# Patient Record
Sex: Female | Born: 1960 | Hispanic: Refuse to answer | Marital: Married | Smoking: Former smoker
Health system: Southern US, Community
[De-identification: ages and names within clinical notes are randomized; demographics above are authoritative.]

## PROBLEM LIST (undated history)

## (undated) DIAGNOSIS — M1990 Unspecified osteoarthritis, unspecified site: Secondary | ICD-10-CM

## (undated) DIAGNOSIS — R5383 Other fatigue: Secondary | ICD-10-CM

## (undated) DIAGNOSIS — T7840XA Allergy, unspecified, initial encounter: Secondary | ICD-10-CM

## (undated) DIAGNOSIS — Z13220 Encounter for screening for lipoid disorders: Secondary | ICD-10-CM

## (undated) DIAGNOSIS — E785 Hyperlipidemia, unspecified: Secondary | ICD-10-CM

## (undated) DIAGNOSIS — N3010 Interstitial cystitis (chronic) without hematuria: Secondary | ICD-10-CM

## (undated) DIAGNOSIS — K5792 Diverticulitis of intestine, part unspecified, without perforation or abscess without bleeding: Secondary | ICD-10-CM

## (undated) DIAGNOSIS — K219 Gastro-esophageal reflux disease without esophagitis: Secondary | ICD-10-CM

## (undated) DIAGNOSIS — M810 Age-related osteoporosis without current pathological fracture: Secondary | ICD-10-CM

## (undated) DIAGNOSIS — R5381 Other malaise: Secondary | ICD-10-CM

## (undated) DIAGNOSIS — M8580 Other specified disorders of bone density and structure, unspecified site: Secondary | ICD-10-CM

## (undated) HISTORY — DX: Age-related osteoporosis without current pathological fracture: M81.0

## (undated) HISTORY — DX: Encounter for screening for lipoid disorders: Z13.220

## (undated) HISTORY — PX: TONSILLECTOMY: SUR1361

## (undated) HISTORY — DX: Hyperlipidemia, unspecified: E78.5

## (undated) HISTORY — DX: Other malaise: R53.81

## (undated) HISTORY — DX: Other malaise: R53.83

## (undated) HISTORY — PX: INTERSTIM IMPLANT PLACEMENT: SHX5130

## (undated) HISTORY — DX: Gastro-esophageal reflux disease without esophagitis: K21.9

## (undated) HISTORY — DX: Interstitial cystitis (chronic) without hematuria: N30.10

## (undated) HISTORY — DX: Other specified disorders of bone density and structure, unspecified site: M85.80

## (undated) HISTORY — PX: VAGINAL HYSTERECTOMY: SUR661

## (undated) HISTORY — PX: WRIST FRACTURE SURGERY: SHX121

## (undated) HISTORY — DX: Unspecified osteoarthritis, unspecified site: M19.90

## (undated) HISTORY — PX: TUBAL LIGATION: SHX77

## (undated) HISTORY — DX: Diverticulitis of intestine, part unspecified, without perforation or abscess without bleeding: K57.92

## (undated) HISTORY — PX: OTHER SURGICAL HISTORY: SHX169

## (undated) HISTORY — PX: WISDOM TOOTH EXTRACTION: SHX21

## (undated) HISTORY — DX: Allergy, unspecified, initial encounter: T78.40XA

## (undated) HISTORY — PX: COLONOSCOPY: SHX174

## (undated) NOTE — *Deleted (*Deleted)
Have a Happy Birthday!  You need an diabetes eye exam every year.  Please see your eye doctor.  Ask them to fax [place] a report of your exam

---

## 1997-11-12 ENCOUNTER — Encounter: Admission: RE | Admit: 1997-11-12 | Discharge: 1997-11-12 | Payer: Self-pay | Admitting: Family Medicine

## 1997-11-19 ENCOUNTER — Encounter: Admission: RE | Admit: 1997-11-19 | Discharge: 1997-11-19 | Payer: Self-pay | Admitting: Family Medicine

## 1997-11-22 ENCOUNTER — Ambulatory Visit (HOSPITAL_COMMUNITY): Admission: RE | Admit: 1997-11-22 | Discharge: 1997-11-22 | Payer: Self-pay | Admitting: Family Medicine

## 1997-12-05 ENCOUNTER — Encounter: Admission: RE | Admit: 1997-12-05 | Discharge: 1997-12-05 | Payer: Self-pay | Admitting: Family Medicine

## 1998-01-16 ENCOUNTER — Encounter: Admission: RE | Admit: 1998-01-16 | Discharge: 1998-01-16 | Payer: Self-pay | Admitting: Family Medicine

## 1998-02-04 ENCOUNTER — Encounter: Admission: RE | Admit: 1998-02-04 | Discharge: 1998-02-04 | Payer: Self-pay | Admitting: Sports Medicine

## 1998-02-13 ENCOUNTER — Encounter: Admission: RE | Admit: 1998-02-13 | Discharge: 1998-02-13 | Payer: Self-pay | Admitting: Family Medicine

## 1998-02-25 ENCOUNTER — Encounter: Admission: RE | Admit: 1998-02-25 | Discharge: 1998-02-25 | Payer: Self-pay | Admitting: Family Medicine

## 1998-04-07 ENCOUNTER — Encounter: Admission: RE | Admit: 1998-04-07 | Discharge: 1998-04-07 | Payer: Self-pay | Admitting: Family Medicine

## 1998-04-28 ENCOUNTER — Encounter: Admission: RE | Admit: 1998-04-28 | Discharge: 1998-04-28 | Payer: Self-pay | Admitting: Sports Medicine

## 1998-06-03 ENCOUNTER — Emergency Department (HOSPITAL_COMMUNITY): Admission: EM | Admit: 1998-06-03 | Discharge: 1998-06-04 | Payer: Self-pay | Admitting: Emergency Medicine

## 1998-09-19 ENCOUNTER — Encounter: Admission: RE | Admit: 1998-09-19 | Discharge: 1998-09-19 | Payer: Self-pay | Admitting: Family Medicine

## 1998-12-09 ENCOUNTER — Other Ambulatory Visit: Admission: RE | Admit: 1998-12-09 | Discharge: 1998-12-09 | Payer: Self-pay | Admitting: Family Medicine

## 1998-12-09 ENCOUNTER — Encounter: Admission: RE | Admit: 1998-12-09 | Discharge: 1998-12-09 | Payer: Self-pay | Admitting: Family Medicine

## 1999-04-13 ENCOUNTER — Ambulatory Visit (HOSPITAL_COMMUNITY): Admission: RE | Admit: 1999-04-13 | Discharge: 1999-04-13 | Payer: Self-pay | Admitting: Urology

## 1999-04-13 ENCOUNTER — Encounter: Payer: Self-pay | Admitting: Urology

## 1999-04-14 ENCOUNTER — Encounter: Admission: RE | Admit: 1999-04-14 | Discharge: 1999-04-14 | Payer: Self-pay | Admitting: Family Medicine

## 1999-05-06 ENCOUNTER — Encounter: Admission: RE | Admit: 1999-05-06 | Discharge: 1999-05-06 | Payer: Self-pay | Admitting: Family Medicine

## 1999-05-08 ENCOUNTER — Ambulatory Visit (HOSPITAL_BASED_OUTPATIENT_CLINIC_OR_DEPARTMENT_OTHER): Admission: RE | Admit: 1999-05-08 | Discharge: 1999-05-08 | Payer: Self-pay | Admitting: Urology

## 1999-05-08 ENCOUNTER — Encounter (INDEPENDENT_AMBULATORY_CARE_PROVIDER_SITE_OTHER): Payer: Self-pay | Admitting: Specialist

## 1999-05-12 ENCOUNTER — Ambulatory Visit (HOSPITAL_COMMUNITY): Admission: RE | Admit: 1999-05-12 | Discharge: 1999-05-12 | Payer: Self-pay | Admitting: Obstetrics

## 1999-05-12 ENCOUNTER — Encounter: Payer: Self-pay | Admitting: Obstetrics

## 1999-05-18 ENCOUNTER — Encounter: Payer: Self-pay | Admitting: Obstetrics

## 1999-05-18 ENCOUNTER — Ambulatory Visit (HOSPITAL_COMMUNITY): Admission: RE | Admit: 1999-05-18 | Discharge: 1999-05-18 | Payer: Self-pay | Admitting: Obstetrics

## 1999-06-12 ENCOUNTER — Encounter: Admission: RE | Admit: 1999-06-12 | Discharge: 1999-06-12 | Payer: Self-pay | Admitting: Family Medicine

## 1999-07-02 ENCOUNTER — Encounter: Admission: RE | Admit: 1999-07-02 | Discharge: 1999-07-02 | Payer: Self-pay | Admitting: Family Medicine

## 1999-11-07 ENCOUNTER — Emergency Department (HOSPITAL_COMMUNITY): Admission: EM | Admit: 1999-11-07 | Discharge: 1999-11-08 | Payer: Self-pay | Admitting: Emergency Medicine

## 1999-11-08 ENCOUNTER — Encounter: Payer: Self-pay | Admitting: Emergency Medicine

## 1999-11-09 ENCOUNTER — Encounter: Admission: RE | Admit: 1999-11-09 | Discharge: 1999-11-09 | Payer: Self-pay | Admitting: Family Medicine

## 1999-11-13 ENCOUNTER — Encounter: Admission: RE | Admit: 1999-11-13 | Discharge: 1999-11-13 | Payer: Self-pay | Admitting: Family Medicine

## 1999-11-16 ENCOUNTER — Encounter: Payer: Self-pay | Admitting: *Deleted

## 1999-11-16 ENCOUNTER — Ambulatory Visit (HOSPITAL_COMMUNITY): Admission: RE | Admit: 1999-11-16 | Discharge: 1999-11-16 | Payer: Self-pay | Admitting: *Deleted

## 1999-11-17 ENCOUNTER — Encounter: Admission: RE | Admit: 1999-11-17 | Discharge: 1999-11-17 | Payer: Self-pay | Admitting: Family Medicine

## 2000-01-22 ENCOUNTER — Encounter: Payer: Self-pay | Admitting: Internal Medicine

## 2000-01-22 ENCOUNTER — Emergency Department (HOSPITAL_COMMUNITY): Admission: EM | Admit: 2000-01-22 | Discharge: 2000-01-22 | Payer: Self-pay | Admitting: Internal Medicine

## 2000-02-02 ENCOUNTER — Emergency Department (HOSPITAL_COMMUNITY): Admission: EM | Admit: 2000-02-02 | Discharge: 2000-02-02 | Payer: Self-pay

## 2000-05-03 ENCOUNTER — Other Ambulatory Visit: Admission: RE | Admit: 2000-05-03 | Discharge: 2000-05-03 | Payer: Self-pay | Admitting: *Deleted

## 2000-05-25 ENCOUNTER — Encounter: Payer: Self-pay | Admitting: *Deleted

## 2000-05-25 ENCOUNTER — Ambulatory Visit (HOSPITAL_COMMUNITY): Admission: RE | Admit: 2000-05-25 | Discharge: 2000-05-25 | Payer: Self-pay | Admitting: *Deleted

## 2000-08-10 ENCOUNTER — Ambulatory Visit (HOSPITAL_COMMUNITY): Admission: RE | Admit: 2000-08-10 | Discharge: 2000-08-10 | Payer: Self-pay | Admitting: Urology

## 2000-09-06 ENCOUNTER — Other Ambulatory Visit: Admission: RE | Admit: 2000-09-06 | Discharge: 2000-09-06 | Payer: Self-pay | Admitting: *Deleted

## 2000-09-06 ENCOUNTER — Encounter (INDEPENDENT_AMBULATORY_CARE_PROVIDER_SITE_OTHER): Payer: Self-pay

## 2001-01-05 ENCOUNTER — Encounter: Admission: RE | Admit: 2001-01-05 | Discharge: 2001-01-05 | Payer: Self-pay | Admitting: Family Medicine

## 2001-01-31 ENCOUNTER — Encounter: Admission: RE | Admit: 2001-01-31 | Discharge: 2001-01-31 | Payer: Self-pay | Admitting: Family Medicine

## 2001-02-01 ENCOUNTER — Encounter: Admission: RE | Admit: 2001-02-01 | Discharge: 2001-05-02 | Payer: Self-pay | Admitting: Urology

## 2001-02-16 ENCOUNTER — Encounter: Admission: RE | Admit: 2001-02-16 | Discharge: 2001-02-16 | Payer: Self-pay | Admitting: Family Medicine

## 2001-02-24 ENCOUNTER — Encounter: Admission: RE | Admit: 2001-02-24 | Discharge: 2001-02-24 | Payer: Self-pay | Admitting: Family Medicine

## 2001-04-25 ENCOUNTER — Encounter: Admission: RE | Admit: 2001-04-25 | Discharge: 2001-04-25 | Payer: Self-pay | Admitting: Family Medicine

## 2001-06-09 ENCOUNTER — Encounter: Admission: RE | Admit: 2001-06-09 | Discharge: 2001-06-09 | Payer: Self-pay | Admitting: Family Medicine

## 2001-06-12 ENCOUNTER — Encounter: Admission: RE | Admit: 2001-06-12 | Discharge: 2001-06-12 | Payer: Self-pay | Admitting: Sports Medicine

## 2001-06-15 ENCOUNTER — Ambulatory Visit (HOSPITAL_COMMUNITY): Admission: RE | Admit: 2001-06-15 | Discharge: 2001-06-15 | Payer: Self-pay | Admitting: Family Medicine

## 2001-06-15 ENCOUNTER — Encounter: Payer: Self-pay | Admitting: Family Medicine

## 2001-07-06 ENCOUNTER — Encounter: Admission: RE | Admit: 2001-07-06 | Discharge: 2001-07-06 | Payer: Self-pay | Admitting: Family Medicine

## 2001-07-13 ENCOUNTER — Other Ambulatory Visit: Admission: RE | Admit: 2001-07-13 | Discharge: 2001-07-13 | Payer: Self-pay | Admitting: Family Medicine

## 2001-07-13 ENCOUNTER — Encounter (INDEPENDENT_AMBULATORY_CARE_PROVIDER_SITE_OTHER): Payer: Self-pay | Admitting: Specialist

## 2001-07-13 ENCOUNTER — Encounter: Admission: RE | Admit: 2001-07-13 | Discharge: 2001-07-13 | Payer: Self-pay | Admitting: Family Medicine

## 2001-11-02 ENCOUNTER — Encounter: Admission: RE | Admit: 2001-11-02 | Discharge: 2001-11-02 | Payer: Self-pay | Admitting: Family Medicine

## 2001-11-02 ENCOUNTER — Other Ambulatory Visit: Admission: RE | Admit: 2001-11-02 | Discharge: 2001-11-02 | Payer: Self-pay | Admitting: Family Medicine

## 2001-11-02 ENCOUNTER — Encounter (INDEPENDENT_AMBULATORY_CARE_PROVIDER_SITE_OTHER): Payer: Self-pay | Admitting: *Deleted

## 2002-06-06 ENCOUNTER — Other Ambulatory Visit: Admission: RE | Admit: 2002-06-06 | Discharge: 2002-06-06 | Payer: Self-pay | Admitting: *Deleted

## 2002-06-12 ENCOUNTER — Other Ambulatory Visit: Admission: RE | Admit: 2002-06-12 | Discharge: 2002-06-12 | Payer: Self-pay | Admitting: *Deleted

## 2002-06-26 ENCOUNTER — Encounter: Payer: Self-pay | Admitting: Family Medicine

## 2002-06-26 ENCOUNTER — Ambulatory Visit (HOSPITAL_COMMUNITY): Admission: RE | Admit: 2002-06-26 | Discharge: 2002-06-26 | Payer: Self-pay | Admitting: Family Medicine

## 2002-07-18 ENCOUNTER — Ambulatory Visit (HOSPITAL_BASED_OUTPATIENT_CLINIC_OR_DEPARTMENT_OTHER): Admission: RE | Admit: 2002-07-18 | Discharge: 2002-07-18 | Payer: Self-pay | Admitting: Orthopedic Surgery

## 2003-01-31 ENCOUNTER — Encounter: Admission: RE | Admit: 2003-01-31 | Discharge: 2003-01-31 | Payer: Self-pay | Admitting: Family Medicine

## 2003-03-15 ENCOUNTER — Ambulatory Visit (HOSPITAL_COMMUNITY): Admission: RE | Admit: 2003-03-15 | Discharge: 2003-03-15 | Payer: Self-pay | Admitting: Urology

## 2003-03-15 ENCOUNTER — Ambulatory Visit (HOSPITAL_BASED_OUTPATIENT_CLINIC_OR_DEPARTMENT_OTHER): Admission: RE | Admit: 2003-03-15 | Discharge: 2003-03-15 | Payer: Self-pay | Admitting: Urology

## 2003-03-29 ENCOUNTER — Ambulatory Visit (HOSPITAL_BASED_OUTPATIENT_CLINIC_OR_DEPARTMENT_OTHER): Admission: RE | Admit: 2003-03-29 | Discharge: 2003-03-29 | Payer: Self-pay | Admitting: Urology

## 2003-05-09 ENCOUNTER — Encounter: Admission: RE | Admit: 2003-05-09 | Discharge: 2003-05-09 | Payer: Self-pay | Admitting: Family Medicine

## 2003-05-21 ENCOUNTER — Ambulatory Visit (HOSPITAL_COMMUNITY): Admission: RE | Admit: 2003-05-21 | Discharge: 2003-05-21 | Payer: Self-pay | Admitting: Urology

## 2003-06-27 ENCOUNTER — Ambulatory Visit (HOSPITAL_COMMUNITY): Admission: RE | Admit: 2003-06-27 | Discharge: 2003-06-27 | Payer: Self-pay | Admitting: *Deleted

## 2003-08-29 ENCOUNTER — Ambulatory Visit (HOSPITAL_COMMUNITY): Admission: RE | Admit: 2003-08-29 | Discharge: 2003-08-29 | Payer: Self-pay | Admitting: Neurology

## 2003-09-05 ENCOUNTER — Encounter: Admission: RE | Admit: 2003-09-05 | Discharge: 2003-09-05 | Payer: Self-pay | Admitting: Neurology

## 2004-01-20 ENCOUNTER — Encounter (INDEPENDENT_AMBULATORY_CARE_PROVIDER_SITE_OTHER): Payer: Self-pay | Admitting: *Deleted

## 2004-01-20 LAB — CONVERTED CEMR LAB

## 2004-01-21 ENCOUNTER — Ambulatory Visit: Payer: Self-pay | Admitting: Family Medicine

## 2004-03-17 ENCOUNTER — Ambulatory Visit: Payer: Self-pay | Admitting: Family Medicine

## 2004-07-16 ENCOUNTER — Ambulatory Visit (HOSPITAL_COMMUNITY): Admission: RE | Admit: 2004-07-16 | Discharge: 2004-07-16 | Payer: Self-pay | Admitting: *Deleted

## 2004-12-17 ENCOUNTER — Ambulatory Visit: Payer: Self-pay | Admitting: Family Medicine

## 2005-07-19 ENCOUNTER — Ambulatory Visit (HOSPITAL_COMMUNITY): Admission: RE | Admit: 2005-07-19 | Discharge: 2005-07-19 | Payer: Self-pay | Admitting: *Deleted

## 2005-08-10 ENCOUNTER — Ambulatory Visit: Payer: Self-pay | Admitting: Family Medicine

## 2006-04-14 DIAGNOSIS — N3010 Interstitial cystitis (chronic) without hematuria: Secondary | ICD-10-CM

## 2006-04-15 ENCOUNTER — Encounter (INDEPENDENT_AMBULATORY_CARE_PROVIDER_SITE_OTHER): Payer: Self-pay | Admitting: *Deleted

## 2006-07-14 ENCOUNTER — Telehealth: Payer: Self-pay | Admitting: *Deleted

## 2006-07-25 ENCOUNTER — Ambulatory Visit (HOSPITAL_COMMUNITY): Admission: RE | Admit: 2006-07-25 | Discharge: 2006-07-25 | Payer: Self-pay | Admitting: *Deleted

## 2006-09-15 ENCOUNTER — Encounter (INDEPENDENT_AMBULATORY_CARE_PROVIDER_SITE_OTHER): Payer: Self-pay | Admitting: *Deleted

## 2006-11-03 ENCOUNTER — Ambulatory Visit: Payer: Self-pay | Admitting: Family Medicine

## 2006-11-03 LAB — CONVERTED CEMR LAB
Cholesterol: 200 mg/dL (ref 0–200)
Glucose, Bld: 106 mg/dL — ABNORMAL HIGH (ref 70–99)
HDL: 49 mg/dL (ref >39)
LDL Cholesterol: 128 mg/dL — ABNORMAL HIGH (ref 0–99)
Total CHOL/HDL Ratio: 4.1
Triglycerides: 115 mg/dL (ref <150)
VLDL: 23 mg/dL (ref 0–40)

## 2006-11-07 ENCOUNTER — Encounter: Payer: Self-pay | Admitting: Family Medicine

## 2006-12-19 ENCOUNTER — Ambulatory Visit: Payer: Self-pay | Admitting: Family Medicine

## 2006-12-19 LAB — CONVERTED CEMR LAB
Bilirubin Urine: NEGATIVE
Blood in Urine, dipstick: NEGATIVE
Epithelial cells, urine: >20 /lpf
Glucose, Urine, Semiquant: NEGATIVE
Nitrite: NEGATIVE
Specific Gravity, Urine: >=1.03
Urobilinogen, UA: 0.2
pH: 6

## 2006-12-20 ENCOUNTER — Encounter (INDEPENDENT_AMBULATORY_CARE_PROVIDER_SITE_OTHER): Payer: Self-pay | Admitting: Family Medicine

## 2006-12-20 LAB — CONVERTED CEMR LAB
Basophils Absolute: 0 10*3/uL (ref 0.0–0.1)
Eosinophils Absolute: 0.2 10*3/uL (ref 0.0–0.7)
Eosinophils Relative: 3 % (ref 0–5)
HCT: 40.2 % (ref 36.0–46.0)
Hemoglobin: 13 g/dL (ref 12.0–15.0)
Lymphs Abs: 2.8 10*3/uL (ref 0.7–3.3)
MCV: 92.8 fL (ref 78.0–100.0)
Monocytes Absolute: 0.4 10*3/uL (ref 0.2–0.7)
Monocytes Relative: 6 % (ref 3–11)
Neutro Abs: 3.1 10*3/uL (ref 1.7–7.7)
Platelets: 206 10*3/uL (ref 150–400)
RDW: 13.1 % (ref 11.5–14.0)
WBC: 6.4 10*3/uL (ref 4.0–10.5)

## 2007-08-03 ENCOUNTER — Ambulatory Visit (HOSPITAL_COMMUNITY): Admission: RE | Admit: 2007-08-03 | Discharge: 2007-08-03 | Payer: Self-pay | Admitting: *Deleted

## 2007-08-15 ENCOUNTER — Emergency Department (HOSPITAL_COMMUNITY): Admission: EM | Admit: 2007-08-15 | Discharge: 2007-08-15 | Payer: Self-pay | Admitting: Emergency Medicine

## 2007-08-16 ENCOUNTER — Telehealth: Payer: Self-pay | Admitting: *Deleted

## 2007-08-17 ENCOUNTER — Ambulatory Visit: Payer: Self-pay | Admitting: Family Medicine

## 2007-08-17 ENCOUNTER — Encounter: Payer: Self-pay | Admitting: Family Medicine

## 2007-08-17 DIAGNOSIS — R002 Palpitations: Secondary | ICD-10-CM | POA: Insufficient documentation

## 2007-08-17 LAB — CONVERTED CEMR LAB: TSH: 2.266 microintl units/mL (ref 0.350–4.50)

## 2007-08-23 ENCOUNTER — Ambulatory Visit: Payer: Self-pay | Admitting: Cardiovascular Disease

## 2007-08-23 ENCOUNTER — Ambulatory Visit: Payer: Self-pay

## 2007-08-23 ENCOUNTER — Encounter: Payer: Self-pay | Admitting: Cardiovascular Disease

## 2007-11-06 ENCOUNTER — Encounter: Payer: Self-pay | Admitting: Family Medicine

## 2007-11-06 ENCOUNTER — Ambulatory Visit: Payer: Self-pay | Admitting: Internal Medicine

## 2007-11-08 ENCOUNTER — Telehealth: Payer: Self-pay | Admitting: *Deleted

## 2008-07-01 ENCOUNTER — Telehealth: Payer: Self-pay | Admitting: *Deleted

## 2008-08-08 ENCOUNTER — Encounter: Payer: Self-pay | Admitting: Family Medicine

## 2008-08-16 ENCOUNTER — Ambulatory Visit (HOSPITAL_COMMUNITY): Admission: RE | Admit: 2008-08-16 | Discharge: 2008-08-16 | Payer: Self-pay | Admitting: Family Medicine

## 2008-09-05 ENCOUNTER — Encounter: Payer: Self-pay | Admitting: Family Medicine

## 2008-09-09 ENCOUNTER — Ambulatory Visit: Payer: Self-pay | Admitting: Family Medicine

## 2008-09-09 ENCOUNTER — Encounter: Payer: Self-pay | Admitting: Family Medicine

## 2008-09-09 ENCOUNTER — Other Ambulatory Visit: Admission: RE | Admit: 2008-09-09 | Discharge: 2008-09-09 | Payer: Self-pay | Admitting: Family Medicine

## 2008-10-15 ENCOUNTER — Ambulatory Visit: Payer: Self-pay | Admitting: Family Medicine

## 2008-10-15 LAB — CONVERTED CEMR LAB
ALT: 10 units/L (ref 0–35)
AST: 12 units/L (ref 0–37)
Albumin: 4.2 g/dL (ref 3.5–5.2)
Alkaline Phosphatase: 66 units/L (ref 39–117)
BUN: 17 mg/dL (ref 6–23)
CO2: 23 meq/L (ref 19–32)
Calcium: 9.2 mg/dL (ref 8.4–10.5)
Chloride: 104 meq/L (ref 96–112)
Cholesterol: 184 mg/dL (ref 0–200)
Creatinine, Ser: 0.68 mg/dL (ref 0.40–1.20)
Glucose, Bld: 100 mg/dL — ABNORMAL HIGH (ref 70–99)
HDL: 52 mg/dL (ref >39)
LDL Cholesterol: 112 mg/dL — ABNORMAL HIGH (ref 0–99)
Potassium: 4.2 meq/L (ref 3.5–5.3)
Sodium: 138 meq/L (ref 135–145)
Total Bilirubin: 0.4 mg/dL (ref 0.3–1.2)
Total CHOL/HDL Ratio: 3.5
Total Protein: 6.9 g/dL (ref 6.0–8.3)
Triglycerides: 101 mg/dL (ref <150)
VLDL: 20 mg/dL (ref 0–40)

## 2008-10-16 ENCOUNTER — Encounter: Payer: Self-pay | Admitting: Family Medicine

## 2008-11-22 ENCOUNTER — Ambulatory Visit: Payer: Self-pay | Admitting: Family Medicine

## 2008-11-28 ENCOUNTER — Encounter (INDEPENDENT_AMBULATORY_CARE_PROVIDER_SITE_OTHER): Payer: Self-pay

## 2008-12-12 ENCOUNTER — Encounter: Payer: Self-pay | Admitting: Family Medicine

## 2009-04-10 ENCOUNTER — Encounter: Payer: Self-pay | Admitting: Family Medicine

## 2009-08-19 ENCOUNTER — Ambulatory Visit (HOSPITAL_COMMUNITY): Admission: RE | Admit: 2009-08-19 | Discharge: 2009-08-19 | Payer: Self-pay | Admitting: Family Medicine

## 2009-08-28 ENCOUNTER — Encounter: Payer: Self-pay | Admitting: Family Medicine

## 2009-11-11 ENCOUNTER — Ambulatory Visit: Payer: Self-pay | Admitting: Cardiovascular Disease

## 2009-11-26 ENCOUNTER — Ambulatory Visit: Payer: Self-pay | Admitting: Family Medicine

## 2009-11-26 LAB — CONVERTED CEMR LAB
ALT: 12 units/L (ref 0–35)
AST: 14 units/L (ref 0–37)
Albumin: 4.7 g/dL (ref 3.5–5.2)
Alkaline Phosphatase: 67 units/L (ref 39–117)
BUN: 17 mg/dL (ref 6–23)
CO2: 29 meq/L (ref 19–32)
Calcium: 10 mg/dL (ref 8.4–10.5)
Chloride: 100 meq/L (ref 96–112)
Creatinine, Ser: 0.71 mg/dL (ref 0.40–1.20)
Glucose, Bld: 93 mg/dL (ref 70–99)
Potassium: 4.1 meq/L (ref 3.5–5.3)
Sodium: 138 meq/L (ref 135–145)
TSH: 2.675 microintl units/mL (ref 0.350–4.500)
Total Bilirubin: 0.4 mg/dL (ref 0.3–1.2)
Total Protein: 7.3 g/dL (ref 6.0–8.3)

## 2009-11-27 ENCOUNTER — Telehealth: Payer: Self-pay | Admitting: *Deleted

## 2009-11-27 ENCOUNTER — Encounter: Payer: Self-pay | Admitting: Family Medicine

## 2009-12-01 ENCOUNTER — Encounter: Payer: Self-pay | Admitting: Family Medicine

## 2009-12-23 ENCOUNTER — Ambulatory Visit: Payer: Self-pay | Admitting: Cardiovascular Disease

## 2010-02-25 ENCOUNTER — Telehealth (INDEPENDENT_AMBULATORY_CARE_PROVIDER_SITE_OTHER): Payer: Self-pay | Admitting: *Deleted

## 2010-03-05 ENCOUNTER — Encounter: Payer: Self-pay | Admitting: Family Medicine

## 2010-03-08 ENCOUNTER — Encounter: Payer: Self-pay | Admitting: *Deleted

## 2010-03-08 ENCOUNTER — Encounter: Payer: Self-pay | Admitting: Neurology

## 2010-03-17 NOTE — Consult Note (Signed)
Summary: [facility]  [facility]   Imported By: Clydell Hakim 09/11/2009 14:24:39  _____________________________________________________________________  External Attachment:    Type:   Image     Comment:   External Document

## 2010-03-17 NOTE — Assessment & Plan Note (Signed)
Summary: F2Y/G D  Medications Added METHADONE HCL 10 MG TABS (METHADONE HCL) 1 tab by mouth two times a day PROPRANOLOL HCL 10 MG TABS (PROPRANOLOL HCL) one tablet as needed palpitations      Allergies Added: NKDA  Primary Provider:  Pearlean Brownie MD  CC:  pt states at night time she has rapid heart beat also excessive sweating pt also complains of alittle dizziness pt has been diagnosed with vertigo.  History of Present Illness: Denise Aguirre is seen today for palpitaitons.  She had a negative cardiac w.u in 2009.  Has had 3 episodes of palpitaiotns over the last month.  Always at night.  Associated with diaphoresis and urgency to go to bathroom.  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  No daytime symptoms and no exertional symptoms.  ? going through menapause with irregular periods.  No SSCP, exertional dyspnea, thyroid disease or syncope  Current Problems (verified): 1)  Palpitations  (ICD-785.1) 2)  Diabetes Mellitus, Screening  (ICD-V77.1) 3)  Intermittent Vertigo  (ICD-780.4) 4)  Gynecological Examinationoutine  (ICD-V72.31) 5)  Screening For Malignant Neoplasm (ICD-V76.2) 6)  Screening For Malignant Neoplasm of The Cervix  (ICD-V76.2) 7)  Long Term Use High Risk Medication  (ICD-V58.69) 8)  Abdominal Pain  (ICD-789.00) 9)  Cholesterol, Screening  (ICD-V77.91) 10)  Ovarian Cyst  (ICD-620.2) 11)  Cystitis, Chronic  (ICD-595.2)  Current Medications (verified): 1)  Methadone Hcl 10 Mg Tabs (Methadone Hcl) .Marland Kitchen.. 1 Tab By Mouth Two Times A Day 2)  Propranolol Hcl 10 Mg Tabs (Propranolol Hcl) .... One Tablet As Needed Palpitations  Allergies (verified): No Known Drug Allergies  Past History:  Past Medical History: Last updated: 11/07/2009 PALPITATIONS DIABETES MELLITUS, SCREENING  INTERMITTENT VERTIGO GYNECOLOGICAL EXAMINATIONOUTINE  SCREENING FOR MALIGNANT NEOPLASM SCREENING FOR MALIGNANT NEOPLASM OF THE CERVIX  LONG TERM USE HIGH RISK MEDICATION  ABDOMINAL  PAIN CHOLESTEROL, SCREENING OVARIAN CYST  CYSTITIS, CHRONIC   Dr Logan Bores for interstitial cystitis  Past Surgical History: Last updated: 11/07/2009 BTL -   Diagnostic lumbar puncture.  Family History: Last updated: 11/07/2009 father: RA sister: premature AFib  Social History: Last updated: 09/09/2008 Lives with children youngest born 50 daughter.  Separated from physician husband.    Review of Systems       Denies fever, malais, weight loss, blurry vision, decreased visual acuity, cough, sputum, SOB, hemoptysis, pleuritic pain, heartburn, abdominal pain, melena, lower extremity edema, claudication, or rash.   Vital Signs:  Patient profile:   50 year old female Height:      65 inches Weight:      158 pounds BMI:     26.39 Pulse rate:   71 / minute Resp:     14 per minute BP sitting:   142 / 74  (left arm)  Vitals Entered By: Kem Parkinson (November 11, 2009 9:42 AM)  Physical Exam  General:  Affect appropriate Healthy:  appears stated age HEENT: normal Neck supple with no adenopathy JVP normal no bruits no thyromegaly Lungs clear with no wheezing and good diaphragmatic motion Heart:  S1/S2 no murmur,rub, gallop or click PMI normal Abdomen: benighn, BS positve, no tenderness, no AAA no bruit.  No HSM or HJR Distal pulses intact with no bruits No edema Neuro non-focal Skin warm and dry    Impression & Recommendations:  Problem # 1:  PALPITATIONS (ICD-785.1) Benign may be related to menapause.  ECG is normal.  Event monitor and pill in pocket Inderal Her updated medication list for this problem includes:  Propranolol Hcl 10 Mg Tabs (Propranolol hcl) ..... One tablet as needed palpitations  Orders: Event (Event)  Her updated medication list for this problem includes:    Propranolol Hcl 10 Mg Tabs (Propranolol hcl) ..... One tablet as needed palpitations  Problem # 2:  CYSTITIS, CHRONIC (ICD-595.2) F/U Dr Logan Bores at [facility].  Failed prevous nerve  stimulation.  Continue methadone  Patient Instructions: 1)  Your physician recommends that you schedule a follow-up appointment in: AS NEEDED PENDING TEST RESULTS 2)  Your physician has recommended you make the following change in your medication: TAKE PROPRANALOL 10MG  ONCE DAILY AS NEEDED FOR PALPATATIONS 3)  Your physician has recommended that you wear an event monitor.  Event monitors are medical devices that record the heart's electrical activity. Doctors most often use these monitors to diagnose arrhythmias. Arrhythmias are problems with the speed or rhythm of the heartbeat. The monitor is a small, portable device. You can wear one while you do your normal daily activities. This is usually used to diagnose what is causing palpitations/syncope (passing out). Prescriptions: PROPRANOLOL HCL 10 MG TABS (PROPRANOLOL HCL) one tablet as needed palpitations  #30 x 12   Entered by:   Deliah Goody, RN   Authorized by:   Colon Branch, MD, [facility]   Signed by:   Deliah Goody, RN on 11/11/2009   Method used:   Electronically to        [facility]* (retail)       [address]       [place], [place]  [id number]       Ph: [phone]       Fax: [phone]   RxID:   [id number] PROPRANOLOL HCL 10 MG TABS (PROPRANOLOL HCL) one tablet as needed palpitations  #30 x 12   Entered by:   Deliah Goody, RN   Authorized by:   Colon Branch, MD, [facility]   Signed by:   Deliah Goody, RN on 11/11/2009   Method used:   Electronically to        [facility] [place] [id number]* (retail)       [address] [place], [place]  [address]       Ph: [phone]       Fax: [phone]   RxID:   [id number]    EKG Report  Procedure date:  11/11/2009  Findings:      NSR 71 Normal ECG

## 2010-03-17 NOTE — Consult Note (Signed)
Summary: [facility]  [facility]   Imported By: Abundio Miu 04/24/2009 13:31:35  _____________________________________________________________________  External Attachment:    Type:   Image     Comment:   External Document

## 2010-03-17 NOTE — Assessment & Plan Note (Signed)
Summary: Palpitation Hot flashes,tcb   Vital Signs:  Patient profile:   50 year old female Height:      65 inches Weight:      155.38 pounds BMI:     25.95 BSA:     1.78 Temp:     98.4 degrees F Pulse rate:   66 / minute BP sitting:   125 / 80  Vitals Entered By: Jone Baseman CMA (November 26, 2009 11:02 AM) CC: cpp Is Patient Diabetic? No Pain Assessment Patient in pain? yes     Location: bladder Intensity: 4   Primary Care Provider:  Pearlean Brownie MD  CC:  cpp.  History of Present Illness: Palpiatations and Hot Flashes see Dr Ricki Miller note for details. Has not had palpitations since saw him. Has had episodes of flushing and feeling very hot.  Also has noticed menstrual period are shorter in between.  No spotting and no weight loss or fever or adenopathy.  Her last 2 paps were normal but is unsure of previous ones. ROS - as above PMH - Medications reviewed and updated in medication list.  Smoking Status noted in VS form     Habits & Providers  Alcohol-Tobacco-Diet     Tobacco Status: quit > 6 months  Current Medications (verified): 1)  Methadone Hcl 10 Mg Tabs (Methadone Hcl) .Marland Kitchen.. 1 Tab By Mouth Two Times A Day 2)  Propranolol Hcl 10 Mg Tabs (Propranolol Hcl) .... One Tablet As Needed Palpitations  Allergies: No Known Drug Allergies  Physical Exam  General:  Well-developed,well-nourished,in no acute distress; alert,appropriate and cooperative throughout examination Neck:  No deformities, masses, or tenderness noted. Lungs:  Normal respiratory effort, chest expands symmetrically. Lungs are clear to auscultation, no crackles or wheezes. Heart:  Normal rate and regular rhythm. S1 and S2 normal without gallop, murmur, click, rub or other extra sounds. Extremities:  No clubbing, cyanosis, edema, or deformity noted with normal full range of motion of joints.     Impression & Recommendations:  Problem # 1:  PALPITATIONS (ICD-785.1) along with hot flushes  most consistent with menopausal symptoms. Will check cmet and TSH to rule out other causes.  Discussed cause and treatments and risk benefits of hormones.   Her updated medication list for this problem includes:    Propranolol Hcl 10 Mg Tabs (Propranolol hcl) ..... One tablet as needed palpitations  Orders: Comp Met-[facility] [phone]) TSH-[facility] [id number]) [facility]- Est Level  3 ([address]562)  Problem # 2:  CYSTITIS, CHRONIC (ICD-595.2) per Dr Logan Bores.     Problem # 3:  Preventive Health Care (ICD-V70.0) will send for records of previous pap smears.  Discussed exercise and calcium intake   Complete Medication List: 1)  Methadone Hcl 10 Mg Tabs (Methadone hcl) .Marland Kitchen.. 1 tab by mouth two times a day 2)  Propranolol Hcl 10 Mg Tabs (Propranolol hcl) .... One tablet as needed palpitations  Patient Instructions: 1)  Try Black Cohosh and or Soy for hot flashes 2)  I will call you if your lab is abnormal otherwise I will send you a letter within 2 weeks. 3)  Will send for records for your Pap if they are worrisome I will call otherwise you need a Pap in next year 4)  Ramon Dredge [employer] [facility] [employer] [facility] Immunizations   Influenza vaccine: Not documented    Tetanus booster: Not documented    Pneumococcal vaccine: Not documented  Other Screening   Pap smear: NEGATIVE FOR INTRAEPITHELIAL LESIONS  OR MALIGNANCY.  (09/09/2008)    Mammogram: ASSESSMENT: Negative - BI-RADS 1^MM DIGITAL SCREENING  (08/19/2009)   Smoking status: quit > 6 months  (11/26/2009)  Lipids   Total Cholesterol: 184  (10/15/2008)   LDL: 112  (10/15/2008)   LDL Direct: Not documented   HDL: 52  (10/15/2008)   Triglycerides: 101  (10/15/2008)

## 2010-03-17 NOTE — Consult Note (Signed)
Summary: [facility]  [facility]   Imported By: Clydell Hakim 04/17/2009 15:47:49  _____________________________________________________________________  External Attachment:    Type:   Image     Comment:   External Document

## 2010-03-17 NOTE — Miscellaneous (Signed)
Summary: [facility]   Imported By: Clydell Hakim 12/02/2009 16:06:01  _____________________________________________________________________  External Attachment:    Type:   Image     Comment:   External Document

## 2010-03-17 NOTE — Progress Notes (Signed)
Summary: phn msg   Phone Note Call from Patient Call back at Home Phone [phone]   Caller: Patient Summary of Call: [facility] ob-gyn - is where she saw doctor for pap Initial call taken by: De Nurse,  November 27, 2009 10:31 AM  Follow-up for Phone Call        Noted. Will fax [facility] Follow-up by: Garen Grams LPN,  November 27, 2009 11:12 AM

## 2010-03-17 NOTE — Letter (Signed)
Summary: Generic Letter  [facility]  [address]   [place], [place] [address]   Phone: [phone]  Fax: [phone]    11/27/2009  Denise Aguirre [address] [place], [place]  [address]  Dear Ms. Lutz,  I'm happy to say all your blood tests are normal.   We will send for your previous Pap smear results.  If you do not hear from [place] in one month please call.  Tests: (1) Comprehensive Metabolic Panel (57846)   Sodium                    138 mEq/L                   135-145   Potassium                 4.1 mEq/L                   3.5-5.3   Chloride                  100 mEq/L                   96-112   CO2                       29 mEq/L                    19-32   Glucose                   93 mg/dL                    96-29   BUN                       17 mg/dL                    5-28   Creatinine                0.71 mg/dL                  0.40-1.20   Bilirubin, Total          0.4 mg/dL                   4.1-3.2   Alkaline Phosphatase      67 U/L                      39-117   AST/SGOT                  14 U/L                      0-37   ALT/SGPT                  12 U/L                      0-35   Total Protein             7.3 g/dL                    4.4-0.1   Albumin                   4.7  g/dL                    0.4-5.4   Calcium                   10.0 mg/dL                  0.9-81.1  Tests: (2) TSH (23280)   TSH                       2.675 uIU/mL                0.350-4.500   Sincerely,   Pearlean Brownie MD  Appended Document: Generic Letter mailed.

## 2010-03-17 NOTE — Letter (Signed)
Summary: Generic Letter  [facility]  [address]   [place], [place] [address]   Phone: [phone]  Fax: [phone]    12/01/2009  Denise Aguirre [address] [place], [place]  [address]  Dear [name],  I received your records from [facility].  You had paps on 06/13/07; 10/27/2005 and 10/05/2004 all were normal.   You should have another Pap in the Spring of 2012.   Sincerely,   Pearlean Brownie MD  Appended Document: Generic Letter mailed

## 2010-03-17 NOTE — Miscellaneous (Signed)
Summary: Med Rec from [facility]   Clinical Lists Changes   Pap smears on 06/13/07; 10/27/2005 and 10/05/2004 all normal Pearlean Brownie MD  December 01, 2009 1:33 PM

## 2010-03-19 ENCOUNTER — Encounter: Payer: Self-pay | Admitting: *Deleted

## 2010-03-19 NOTE — Progress Notes (Signed)
  Phone Note Outgoing Call   Call placed by: Scherrie Bateman, LPN,  February 25, 2010 4:51 PM Call placed to: Patient Summary of Call: PT AWARE OF MONITOR RESULTS PER DR NISHAN NRS NO SIG ARRYTHMIAS ALOT OF ARTIFACT Initial call taken by: Scherrie Bateman, LPN,  February 25, 2010 4:52 PM

## 2010-03-25 NOTE — Consult Note (Signed)
Summary: [facility] - Office visit  [facility] - Office visit   Imported By: De Nurse 03/17/2010 15:06:53  _____________________________________________________________________  External Attachment:    Type:   Image     Comment:   External Document

## 2010-06-30 NOTE — Assessment & Plan Note (Signed)
[facility]                            CARDIOLOGY OFFICE NOTE   MARGUERITTE, GUTHRIDGE                 MRN:          [id number]  DATE:08/23/2007                            DOB:          11/04/1960    HISTORY:  Denise Aguirre is a 50 year old patient, referred for  dyspnea and palpitations from [facility] by Dr. Beverely Low.   The patient denies previous cardiac problems.  Over the last few months,  she has had intermittent palpitations.  They sound benign.  She gets  fairly sudden onset of rapid heart beats, then she gets some flip flops,  that can last for minutes.  She initially was having them almost weekly.  She has not had them, but once or twice a month now.   There is a possibility if they could represent PSVT.  She tends to get  them more at night.  She walks on a treadmill every day and does not  have any significant chest pain.  She does have some exertional dyspnea.  When she gets a bad episode, she can get diaphoresis.  She then  describes a pause, where she feels like her heart stops for a while and  she can get a bit diaphoretic.  She has had some drawing sensations in  the right lower extremity and right upper extremity, but no other signs  of TIA or CVA.  When she gets rapid palpitations, she can occasionally  have pains, but is usually not presyncopal.   REVIEW OF SYSTEMS:  Otherwise negative.   PAST MEDICAL HISTORY:  Remarkable for significant interstitial  nephritis.  She has had a nerve stimulator placed and is on methadone.  She is on disability due to her chronic pain.  She otherwise has a  fairly benign history.   SOCIAL HISTORY:  She is a nonsmoker, nondrinker, and not had any other  previous surgeries.  She is separated.  She has 3 children at home.  She  does walk on a treadmill on a regular basis.   ALLERGIC:  IV CONTRAST.   MEDICATIONS:  She is on methadone 10 mg b.i.d.   FAMILY HISTORY:  Remarkable for a sister  who may have had premature  AFib, otherwise negative.   PHYSICAL EXAMINATION:  GENERAL:  Remarkable for a healthy-appearing 80-  year-old female, in no distress.  VITAL SIGNS:  Blood pressure is 120/70, pulse 60 and regular,  respiratory rate 14, and afebrile.  HEENT:  Unremarkable.  NECK:  Carotids are normal without bruit.  No lymphadenopathy,  thyromegaly, or JVP elevation.  LUNGS:  Clear, good diaphragmatic motion.  No wheezing.  S1 and S2 with  normal heart sounds.  PMI normal.  ABDOMEN:  Benign.  Bowel sounds positive.  No AAA.  No tenderness.  No  bruit.  No hepatosplenomegaly.  No hepatojugular reflux.  EXTREMITIES:  Distal pulses are intact.  No edema.  NEURO:  Nonfocal.  SKIN:  Warm and dry.  MUSCULOSKELETAL:  No muscular weakness.   EKG at baseline is normal.   IMPRESSION:  1. Palpitations, may have occult atrioventricular nodal reentrant      tachycardia  or paroxysmal supraventricular tachycardia.  We will      give an event monitor for 2-3 months, p.r.n. Inderal given.  2. Dyspnea with palpitations.  We will check a 2-D echocardiogram to      rule out structural heart disease.  3. Interstitial cystitis.  Continue to follow up with Dr. Logan Bores.      Continue methadone for pain.  Nerve stimulator apparently turned      off due to overstimulation of the right sciatic nerve.  4. Health maintenance.  The patient has had her thyroid checked.  She      should follow up with her primary care MD regarding followup of her      cholesterol.   I will see her back in 3 months unless her event monitor shows  significant arrhythmia.     Noralyn Pick. Eden Emms, MD, [facility]  Electronically Signed    PCN/MedQ  DD: 08/23/2007  DT: 08/23/2007  Job #: [id number]   cc:   Neena Rhymes, M.D.

## 2010-06-30 NOTE — Letter (Signed)
November 06, 2007    Noralyn Pick. Eden Emms, MD, [facility]  [address]  [address]  [place], [place] [address]   RE:  Denise Aguirre, Denise Aguirre  MRN:  [id number]  /  DOB:  [birth year]   Dear Theron Arista,   It was a pleasure to see Ardell Aaronson in consultation because of  nocturnal palpitations.   As you know she is a 50 year old separated mother of three who has a 6-  month history of recurrent nocturnal palpitations.  She describes these  episodes was occurring at night, waking her from sleep lasting a couple  of minutes and then abating.  They are quite frightening to her.  They  are unassociated with shortness of breath or chest pain.   Over the same period of time, she has had some episodes of arm and leg  tingling.  These lasted couple of days and in the context of those, she  went to the emergency room.  At that time, blood work was drawn.  Her  CBC was normal.  Thyroids were not checked.   She has had no daytime palpitations.  She has had no antecedent history  of palpitations and no history of syncope.   She has had no problems with exercise tolerance.  She does have some  vague chest pressures they are unrelated to exertion.   Electrocardiogram and echocardiogram have been done thus far that were  normal.  Her vent recorder as described below.   She does not use caffeine because of interstitial cystitis.  She does  use occasional chocolate.   She does not use cigarettes, alcohol, or recreational drugs.   PAST MEDICAL HISTORY:  Notable for her interstitial cystitis, which is  in a problem for about 10 years.  It is sufficiently severe that she  takes methadone for the pain; she has frequent nocturia.   She also has complaints of fatigue.   REVIEW OF SYSTEMS:  Otherwise, negative.   PAST SURGICAL HISTORY:  Notable for tubal ligation, treatment of  interstitial cystitis done surgically with some removal of the tubal as  well as cystoscopies.   Her medications include methadone  taking 10 mg b.i.d.   She is allergic to IV DYE.   SOCIAL HISTORY:  As noted previously.  She does not use cigarettes,  alcohol, or recreational drugs.   PHYSICAL EXAMINATION:  GENERAL:  She is a healthy-appearing middle-aged  Caucasian female who appearing the stated age of 3.  VITAL SIGNS:  Her blood pressure 116/78.  Her pulse was 80.  Her weight  was 151, which is relatively stable.  HEENT:  No icterus or xanthoma.  NECK:  Veins were flat.  The carotids are brisk  and full bilaterally  without bruits.  BACK:  Without kyphosis or scoliosis.  LUNGS:  Clear.  HEART:  Sounds regular without murmurs or gallops.  ABDOMEN:  Soft with active bowel sounds without midline pulsation or  hepatomegaly.  EXTREMITIES:  Femoral pulses were 2+.  Distal pulses were intact with no  clubbing, cyanosis, or edema.  NEUROLOGIC:  Grossly normal.  SKIN:  Warm and dry.   Electrocardiogram was normal.   Review of the event recorder strips that you have ordered demonstrated 2  nocturnal episodes of tachycardia where heart rate was noted to change  from a base rate of about 75 to a peak of about 130.  Interestingly,  both of these were relatively sudden, but with gradually or  incrementally decreasing RR intervals.  In addition,  there appeared to  be a single artifact at the onset of these suggestive of motion artifact  occurring simultaneous with the acceleration of the heart rate and not  before.   IMPRESSION:  1. Nocturnal tachycardia, probably sinus seeming to occurring      simultaneous with a waking, but not before.  2. Structurally normal heart.  3. Interstitial cystitis.   Cindee Lame, Ms. Ligas's tachy arrhythmia appears to be responsive  sympathetic discharge in at the RR intervals gradually change both with  acceleration as well as deceleration.  Further the P-wave morphology are  relatively similar to the sinus morphology.  The fact that there is a  wire artifact as the heart rate  goes up suggest to me that that is a  motion if she is awakening and that would ever the stimulus is further  more rapid heart rate prompts both to occur that she is awakening and  the heart rate going up, thereafter, may actually be a secondary  phenomenon.  Certainly snoring can do this and she apparently has this  history of nocturnal stimulation with dreams is certainly seen.  I do  not see any evidence of pathological heart rate and I have reassured  about this.   I suggest that she has significant symptoms perhaps nocturnal, Inderal  might be useful in a short-acting formulation.  The fact that the there  is uncertainty as to when this might occur in particularly problematic  for her, but hopefully she is reassured.   If there is any further I can do, please do not hesitate to contact me.    Sincerely,      Duke Salvia, MD, [facility]  Electronically Signed    SCK/MedQ  DD: 11/06/2007  DT: 11/07/2007  Job #: [id number]   CC:    Pearlean Brownie, M.D.

## 2010-07-03 NOTE — Op Note (Signed)
   NAME:  Denise Aguirre, Denise Aguirre                    ACCOUNT NO.:  [id number]   MEDICAL RECORD NO.:  [id number]                   PATIENT TYPE:  AMB   LOCATION:  DSC                                  FACILITY:  [facility]   PHYSICIAN:  Artist Pais. Mina Marble, M.D.           DATE OF BIRTH:  1960-05-28   DATE OF PROCEDURE:  07/18/2002  DATE OF DISCHARGE:                                 OPERATIVE REPORT   PREOPERATIVE DIAGNOSIS:  Right thumb tenosynovitis.   POSTOPERATIVE DIAGNOSIS:  Right thumb tenosynovitis.   PROCEDURE:  Right thumb A1-pulley release.   SURGEON:  Artist Pais. Mina Marble, M.D.   ASSISTANT:  ___________.   ANESTHESIA:  General.   TOURNIQUET TIME:  10 minutes.   No complications.   OPERATIVE REPORT:  The patient was taken to the operating room. After the  induction of adequate general anesthesia, right upper extremity was prepped  and draped in the usual sterile fashion. An Esmarch was used to exsanguinate  the limb. Tourniquet was inflated to 250 mmHg. At this point in time, a  transverse incision was made at the MP flexion crease of the right thumb,  approximately 1-1/2 to 2 cm in length, and incision was taken down to the  subcutaneous tissues. Neurovascular bundles were identified and retracted.  The A1 pulley was split with tenotomy scissors, with splitting up of the FPL  tendon. The FPL tendon was lysed of all adhesions. The wound was irrigated  and loosely closed with a 3-0 Prolene suture. Sterile dressing, Xeroform, 4  x 4, and compressive dressing was applied. The patient tolerated the  procedure well and discharge in stable fashion.                                               Artist Pais Mina Marble, M.D.    MAW/MEDQ  D:  07/18/2002  T:  07/18/2002  Job:  [id number]

## 2010-07-03 NOTE — Op Note (Signed)
[facility]  Patient:    Denise Aguirre, Denise Aguirre                 MRN: [id number] Proc. Date: 08/10/00 Adm. Date:  [id number] Attending:  Londell Moh                           Operative Report  SERVICE:  Urology.  PREOPERATIVE DIAGNOSIS:  Interstitial cystitis.  POSTOPERATIVE DIAGNOSIS:  Interstitial cystitis.  PROCEDURE:  Cystoscopy, urethral calibration, hydrodistention of the bladder, Marcaine and Pyridium installation, Marcaine and Kenalog injection.  SURGEON:  Dr. Logan Bores.  ANESTHESIA:  General.  COMPLICATIONS:  None.  DRAINS:  None.  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 The patient is interested in enrolling in one of the drug studies and requires an updated cystoscopy and hydrodistention to confirm the presence of the disease. The patient is to undergo diagnostic cystoscopy. The patient understands this may also be therapeutic and she gave full and informed consent.  DESCRIPTION OF PROCEDURE:  After successful induction of general anesthesia, the patient was placed in dorsal lithotomy position and prepped with Betadine and draped in the usual sterile fashion. Careful bimanual examination reveals that she has a modest cystocele, there is some prolapse of the urethra. There is a modest rectocele but these are not particularly symptomatic. The urethra itself was unremarkable and of normal caliber accepting a 42 [place] female urethral sound. ______ stricture. The cystoscope was inserted, the bladder was carefully inspected and was free of any tumor or stones. Both ureteral orifices were normal in configuration and location, the bladder was distended at a pressure of 100 cm of water. After five minutes, the bladder was drained, the patient had marked glomerulations throughout the bladder and the bladder capacity was only 450 cc. The patient did not require a bladder biopsy as one has been done  previously. The patient had a mixture of Marcaine and Pyridium left in the bladder, Marcaine and Kenalog were injected periurethrally. A B&O suppository was inserted, the patient was given intraoperative Toradol and Zofran. She was taken to the recovery room in good condition. She will be sent home with Lorcet plus, Pyridium plus and doxycycline, return to the office in follow-up. DD:  08/10/00 TD:  08/10/00 Job: [id number]

## 2010-07-03 NOTE — Op Note (Signed)
NAME:  Denise Aguirre, Denise Aguirre                    ACCOUNT NO.:  [id number]   MEDICAL RECORD NO.:  [id number]                   PATIENT TYPE:  AMB   LOCATION:                                       FACILITY:  [facility]   PHYSICIAN:  Jamison Neighbor, M.D.               DATE OF BIRTH:  [birth year]   DATE OF PROCEDURE:  03/29/2003  DATE OF DISCHARGE:                                 OPERATIVE REPORT   PREOPERATIVE DIAGNOSES:  Urgency incontinence.   POSTOPERATIVE DIAGNOSES:  Urgency incontinence.   PROCEDURE:  Second stage Interstim implantation.   SURGEON:  Jamison Neighbor, M.D.   ANESTHESIA:  IV sedation with local.   COMPLICATIONS:  None.   DRAINS:  None.   BRIEF HISTORY:  This patient had successful implantation of first stage  Interstim. She has had significant improvement in her urgency and frequency  and feels that she voids with better control, larger volumes and less need  to push or strain to empty her bladder. The patient has been on antibiotic  coverage in preparation for the procedure. She notes that she has had some  redness at the exit site from the first stage of external lead but no signs  of any infection at the proposed site for the second stage implant. The  patient understands that there are certainly of course some risk for  infection and for that reason she will receive preoperative, intraoperative  and postoperative antibiotics. She gave full and informed consent.   DESCRIPTION OF PROCEDURE:  After induction of adequate IV sedation, the  patient was placed in the prone position, she was given a full 10 minute  prep with scrub and paint. The patient was then redraped in preparation for  the second stage implant. The external wire was cut close to the skin and  was held in place with a hemostat. The suture from the initial paramedian  incision as well as lateral incision were cut. Under local anesthesia, an  incision was made in the upper portion of the left buttock.  The connection  from the previously placed quadripolar lead to the external lead was  identified. The lead was cut and the device was disconnected.  The  previously placed quadripolar lead was then connected to a new lead  extender. The protective bootie was tied down with silk sutures, the lead  extender was attached to the IPG.  The pocket was fully developed, the IPG  was placed down within the pocket with the writing side up and with  appropriate position the area was carefully irrigated with antibiotic  solution.  Impedance testing showed that there was no problems with the  connections. The incision was then closed with 2-0 Vicryl and then with a  running double cross suture of 3-0 nylon. The patient tolerated the  procedure well and was taken to the recovery room in good condition.  Jamison Neighbor, M.D.   RJE/MEDQ  D:  03/29/2003  T:  03/29/2003  Job:  [id number]

## 2010-07-03 NOTE — Op Note (Signed)
NAME:  Denise Aguirre, CHEVALIER NO.:  [id number]   MEDICAL RECORD NO.:  [id number]                   PATIENT TYPE:  AMB   LOCATION:  DAY                                  FACILITY:  [facility]   PHYSICIAN:  Jamison Neighbor, M.D.               DATE OF BIRTH:  December 18, 1960   DATE OF PROCEDURE:  05/21/2003  DATE OF DISCHARGE:                                 OPERATIVE REPORT   PREOPERATIVE DIAGNOSIS:  Undesired InterStim.   POSTOPERATIVE DIAGNOSIS:  Undesired InterStim.   OPERATION/PROCEDURE:  Removal of InterStim and lead.   SURGEON:  Jamison Neighbor, M.D.   ASSISTANT:  Thyra Breed, M.D.   ANESTHESIA:  Local.   COMPLICATIONS:  None.   INDICATIONS FOR PROCEDURE:  [name] is a 50 year old female who has  undergone placement of a InterStim in the past for interstitial cystitis.  However, the patient has begun having pain in her shoulder as well as her  right arm and leg.  The patient has been consented on risks, benefits and  alternatives of removing her InterStim device and is willing to proceed.   DESCRIPTION OF PROCEDURE:  Following identification by her arm bracelet, the  patient was brought to the operating room and placed in the prone position.  She then underwent local anesthesia and received preoperative IV  antibiotics.  Her lower back was then prepped and draped in the sterile  fashion.  The earlier well-healed incision just above the right upper iliac  spine was infiltrated with 0.5% Marcaine.  Following this, a #15 blade was  used to sharply incise the old scar.  The Bovie was then used to carry the  incision down through the subcutaneous tissue and divide the capsule  surrounding the generator.  The generator was then easily visible and  grasped and removed from its capsule.  We then grasped the lead and were  able to remove it through the same incision.  The wound was then copiously  irrigated with antibiotic solution.  The Bovie was then used  to cauterize  any subcutaneous bleeding.  Excellent hemostasis was obtained.  Next, a 3-0  Vicryl suture was used in a running fashion to close the deep subcutaneous  tissue including the pseudo capsule to eliminate any dead space.  Surgical  clips were then applied for skin closure.  The wound was then washed and a  Tegaderm dressing was applied.  The patient tolerated the procedure well and  there were no complications.   Please note that Dr. Logan Bores was present and participated in the entire  procedure as he was the responsible surgeon.   DISPOSITION:  After awakening from local anesthesia, the patient was  transported to the post anesthesia care unit in stable condition.  From here  once she was awake, would be discharged to home with a prescription for  Tylox and an antibiotic.  She will call the clinic at [phone] for a return  appointment to remove her staples.     Thyra Breed, MD                            Jamison Neighbor, M.D.    EG/MEDQ  D:  05/21/2003  T:  05/21/2003  Job:  [id number]

## 2010-07-03 NOTE — Op Note (Signed)
[facility]  Patient:    Denise Aguirre, Denise Aguirre                 MRN: [id number] Proc. Date: 05/08/99 Adm. Date:  [id number] Attending:  Liborio Nixon CC:         Estill Batten. Deirdre Priest, M.D., [facility]             Kathreen Cosier, M.D.                           Operative Report  PREOPERATIVE DIAGNOSIS:  Painful bladder/rule out interstitial cystitis.  POSTOPERATIVE DIAGNOSIS:  Painful bladder/rule out interstitial cystitis.  PRINCIPAL PROCEDURE:  Cystoscopy, hydrodistention of bladder, bladder biopsy.  SURGEON:  Bertram Millard. Dahlstedt, M.D.  ANESTHESIA:  General with LMA.  COMPLICATIONS:  None.  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  She has failed conservative management medically. She presents at this time for cystoscopy, hydrodistention of bladder and bladder biopsies to rule out IC.  DESCRIPTION OF PROCEDURE:  The patient was administered a general anesthetic using LMA.  She was placed in the dorsal lithotomy position.  Genitalia and perineum ere prepped and draped.  Bladder was then inspected and found to have no lesions. Ureteral orifices were normal in configuration and location.  Two biopsies were  taken, one just posterior to the trigone and one on the right lateral wall of the bladder.  These were sent as random bladder biopsies.  The bladder biopsies were clamped and cauterized with the Bugbee electrode.  The bladder was then filled ith approximately 1000 cc of water.  It was left distended for approximately 1-2 minutes.  The bladder was then drained.  Glomerulations were then present throughout the bladder.  There was minimal bleeding.  Bimanual examination was normal.  The bladder was drained, the scope removed, the procedure terminated. The patient tolerated the procedure well and was returned to the PACU in  stable condition. DD:  05/08/99 TD:  05/08/99 Job: [id number]

## 2010-07-03 NOTE — Op Note (Signed)
NAME:  Denise Aguirre, Denise Aguirre                    ACCOUNT NO.:  [id number]   MEDICAL RECORD NO.:  [id number]                   PATIENT TYPE:  OUT   LOCATION:  MDC                                  FACILITY:  [facility]   PHYSICIAN:  Casimiro Needle L. Thad Ranger, M.D.           DATE OF BIRTH:  [birth year]   DATE OF PROCEDURE:  08/29/2003  DATE OF DISCHARGE:                                 OPERATIVE REPORT   PROCEDURE PERFORMED:  Diagnostic lumbar puncture.   INDICATIONS FOR PROCEDURE:  Right hemi numbness, possible multiple  sclerosis.   DESCRIPTION OF PROCEDURE:  Informed consent was obtained and was signed and  placed in the chart after the procedure, risks and benefits were explained  to the patient and she agreed to proceed.  Due to the patient's difficulty  with bladder spasms she was placed in a seated position and the procedure  was performed in the upright seated position.  She was then prepped and  draped in the usual sterile fashion.  Local anesthesia was achieved with 2  mL of lidocaine.  A 10 gauge spinal needle was inserted into the L3-4  interspace and advanced until clear CSF was obtained.  A total of 8 mL was  collected into four tubes and sent for laboratory analysis as follows.  Tube  #1 2 mL, cell count and differential.  Tube #2 2 mL, glucose, protein,  oligoclonal banding analysis.  Tube #3 2 mL, Gram stain and culture.  Tube  #4 2 mL to be held in lab for further testing as deemed necessary.  No  opening or closing pressures were obtained due to the fact that the  procedure was performed upright.  The needle was withdrawn and hemostasis  was obtained.  No immediate complications were noted.  The patient was  advised to lie flat for one hour following the procedure and then is to be  discharged home.  One red top of blood will be obtained and sent to the  laboratory with the CSF for oligoclonal banding analysis.  The patient was  advised to call the office immediately or come to the  emergency room if she  developed fever, intractable low back pain or weakness or numbness in the  lower extremities and to call the office in the next couple of days if she  developed a postural headache.                                               Michael L. Thad Ranger, M.D.    MLR/MEDQ  D:  08/29/2003  T:  08/29/2003  Job:  [id number]

## 2010-07-03 NOTE — Op Note (Signed)
NAME:  Denise Aguirre, Denise Aguirre NO.:  [id number]   MEDICAL RECORD NO.:  [id number]                   PATIENT TYPE:  AMB   LOCATION:  NESC                                 FACILITY:  [facility]   PHYSICIAN:  Jamison Neighbor, M.D.               DATE OF BIRTH:  [birth year]   DATE OF PROCEDURE:  03/15/2003  DATE OF DISCHARGE:                                 OPERATIVE REPORT   SERVICE:  Urology.   PREOPERATIVE DIAGNOSES:  Urgency incontinence.   POSTOPERATIVE DIAGNOSES:  Urgency incontinence.   PROCEDURE:  First stage Interstim implantation.   SURGEON:  Jamison Neighbor, M.D.   ANESTHESIA:  Local with IV sedation.   COMPLICATIONS:  None.   DRAINS:  None.   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  She has failed to respond to standard forms of therapy and is  now to undergo Interstim implantation.  She understands the risks and  benefits of the procedure and gave informed consent.   DESCRIPTION OF PROCEDURE:  After successful induction of adequate IV  sedation, the patient was placed in the prone position, she was secured to  the bed with tape to expose the buttocks. The patient was given a full 10  minute scrub and paint and then was draped including a vidrape. Fluoroscopy  was used to identify the level of the third sacral foramen, local anesthesia  was infiltrated into the tissues above the sacrum. The foramen needle was  then passed into the third sacral foramen on both sides. A bellows effect  was obtained on both sides and there was some dorsiflexion of the great toe  on the right but not so much on the left.  Position was ascertained to be  correct by fluoroscopy as well as by placement of a _________ needle one  foramen higher and obtained the usual second sacral nerve effects with foot  rotation.  The right foramen was selected, a small incision was made and a  wire was passed down through the needle. The  dilating system was then used  to dilate the tract down to the foramen.  The quadripolar lead was passed  down through the trocar and then pulled back into position as ascertained by  fluoroscopy with the lead in place. There was good toe flexion on leads 1, 2  and 3.  The patient had a small incision made in the lateral aspect of the  right buttocks. The tunneling tool was used to bring the quadripolar lead  from the paramedian incision over to the lateral incision.  The tunneling  tool was then used to bring the external lead extender from the lateral  incision to a puncture hole in the right buttocks. The connection from the  quadripolar lead to the lead extender was made in the usual fashion. The  booty was tied down with silk sutures. A small pocket was made and  the  connection was placed down within the pocket. The two incisions were then  closed with Vicryl as well as  with nylon sutures. The patient had a standard dressing applied, she  tolerated the procedure well and was taken to the recovery room in good  condition. She will be sent home with Tylox as well as Keflex and return to  see me in the office in one week.                                               Jamison Neighbor, M.D.    RJE/MEDQ  D:  03/15/2003  T:  03/15/2003  Job:  [id number]

## 2010-07-24 ENCOUNTER — Other Ambulatory Visit: Payer: Self-pay | Admitting: Family Medicine

## 2010-07-24 DIAGNOSIS — Z1231 Encounter for screening mammogram for malignant neoplasm of breast: Secondary | ICD-10-CM

## 2010-08-21 ENCOUNTER — Ambulatory Visit (HOSPITAL_COMMUNITY)
Admission: RE | Admit: 2010-08-21 | Discharge: 2010-08-21 | Disposition: A | Discharge status: Home / Self Care | Payer: Medicaid Other | Source: Ambulatory Visit | Attending: Family Medicine | Admitting: Family Medicine

## 2010-08-21 DIAGNOSIS — Z1231 Encounter for screening mammogram for malignant neoplasm of breast: Secondary | ICD-10-CM | POA: Insufficient documentation

## 2010-10-31 ENCOUNTER — Emergency Department (HOSPITAL_COMMUNITY)
Admission: EM | Admit: 2010-10-31 | Discharge: 2010-10-31 | Disposition: A | Discharge status: Home / Self Care | Payer: Medicaid Other | Attending: Emergency Medicine | Admitting: Emergency Medicine

## 2010-10-31 DIAGNOSIS — Z23 Encounter for immunization: Secondary | ICD-10-CM | POA: Insufficient documentation

## 2010-10-31 DIAGNOSIS — W540XXA Bitten by dog, initial encounter: Secondary | ICD-10-CM | POA: Insufficient documentation

## 2010-10-31 DIAGNOSIS — S61409A Unspecified open wound of unspecified hand, initial encounter: Secondary | ICD-10-CM | POA: Insufficient documentation

## 2010-10-31 DIAGNOSIS — S61509A Unspecified open wound of unspecified wrist, initial encounter: Secondary | ICD-10-CM | POA: Insufficient documentation

## 2010-11-12 LAB — TROPONIN I: Troponin I: <0.01

## 2010-11-12 LAB — POCT I-STAT, CHEM 8
BUN: 16
Calcium, Ion: 1.19
Chloride: 103
Creatinine, Ser: 0.8
Glucose, Bld: 99
HCT: 39
Hemoglobin: 13.3
Potassium: 4.1
Sodium: 137
TCO2: 26

## 2010-11-12 LAB — DIFFERENTIAL
Basophils Absolute: 0.1
Eosinophils Absolute: 0
Lymphocytes Relative: 27
Monocytes Relative: 9
Neutro Abs: 6.1
Neutrophils Relative %: 63

## 2010-11-12 LAB — RAPID URINE DRUG SCREEN, HOSP PERFORMED
Amphetamines: NOT DETECTED
Benzodiazepines: NOT DETECTED
Cocaine: NOT DETECTED

## 2010-11-12 LAB — CBC
Hemoglobin: 13.3
MCHC: 34.3
Platelets: 122 — ABNORMAL LOW
RDW: 12.8

## 2010-11-12 LAB — POCT PREGNANCY, URINE
Operator id: 29011
Preg Test, Ur: NEGATIVE

## 2010-11-12 LAB — URINALYSIS, ROUTINE W REFLEX MICROSCOPIC
Bilirubin Urine: NEGATIVE
Ketones, ur: NEGATIVE
Nitrite: NEGATIVE
Protein, ur: NEGATIVE
Specific Gravity, Urine: 1.015
Urobilinogen, UA: 1

## 2010-11-12 LAB — PROTIME-INR
INR: 1
Prothrombin Time: 13

## 2010-11-12 LAB — POCT CARDIAC MARKERS
CKMB, poc: 1.3
Myoglobin, poc: 36.4
Myoglobin, poc: 39.9
Operator id: 290111
Operator id: 290111
Troponin i, poc: 0.14 — ABNORMAL HIGH

## 2010-11-12 LAB — APTT: aPTT: 23 — ABNORMAL LOW

## 2010-11-12 LAB — URINE MICROSCOPIC-ADD ON

## 2010-12-14 ENCOUNTER — Ambulatory Visit: Payer: Medicaid Other | Admitting: Family Medicine

## 2010-12-21 ENCOUNTER — Encounter: Payer: Self-pay | Admitting: Family Medicine

## 2010-12-21 ENCOUNTER — Ambulatory Visit (INDEPENDENT_AMBULATORY_CARE_PROVIDER_SITE_OTHER): Payer: Medicaid Other | Admitting: Family Medicine

## 2010-12-21 VITALS — BP 119/78 | HR 87 | Ht 65.0 in | Wt 164.0 lb

## 2010-12-21 DIAGNOSIS — M79609 Pain in unspecified limb: Secondary | ICD-10-CM

## 2010-12-21 DIAGNOSIS — M79606 Pain in leg, unspecified: Secondary | ICD-10-CM

## 2010-12-21 DIAGNOSIS — Z131 Encounter for screening for diabetes mellitus: Secondary | ICD-10-CM

## 2010-12-21 DIAGNOSIS — Z23 Encounter for immunization: Secondary | ICD-10-CM

## 2010-12-21 NOTE — Assessment & Plan Note (Signed)
New.  Most consistent with musculoskeletal pain perhaps from overuse.  No signs of internal derangement.  Could be DJD.   Treat symptomatically and get xray if persists

## 2010-12-21 NOTE — Patient Instructions (Signed)
Use tylenol or ibuprofen as needed for pain  Cut back on the treadmill and bike or swim but then slowly go back to you usual time   Use ice after exercise if it hurts  Practice quad strengthening exercises (heel thru the floor) 4 x a day   If the knee is not better in 4-6 weeks or getting worse then call us for an xray   You need a pap smear in 2013  I have ordered a fasting blood sugar test - call us the day before you want to have it drawn

## 2010-12-21 NOTE — Progress Notes (Signed)
  Subjective:    Patient ID: Denise Aguirre, female    DOB: [birth year], 50 y.o.   MRN: [id number]  HPI  JOINT PAIN Location: R knee and upper leg.   Course:  Feel on knee a year ago, had a pain for a few weeks which resolved but came back for a few days a few weeks ago but now present for 1-2 weeks continuously Worse with:  walking Better with:  Massage or ibuprofen slightly Trauma: as above none recenlty Swelling:  no Locking:  no Other Joints involved:  no Rash: no Fever:  No  Review of Symptoms - see HPI  PMH - Smoking status noted.      Review of Systems     Objective:   Physical Exam  Extremities:  No cyanosis, edema, or deformity noted with good range of motion of all major joints.   R Knee - no effusion FROM, no focal tenderness other than mild lateral lower quad muscle pain.  No laxity or instability. FROM without pain at ankle and hip.  No patella motion pain       Assessment & Plan:

## 2010-12-30 ENCOUNTER — Ambulatory Visit: Payer: Medicaid Other | Attending: Sports Medicine

## 2010-12-30 DIAGNOSIS — IMO0001 Reserved for inherently not codable concepts without codable children: Secondary | ICD-10-CM | POA: Insufficient documentation

## 2010-12-30 DIAGNOSIS — M25569 Pain in unspecified knee: Secondary | ICD-10-CM | POA: Insufficient documentation

## 2011-01-19 ENCOUNTER — Ambulatory Visit: Payer: Medicaid Other | Attending: Sports Medicine | Admitting: Physical Therapy

## 2011-01-19 DIAGNOSIS — IMO0001 Reserved for inherently not codable concepts without codable children: Secondary | ICD-10-CM | POA: Insufficient documentation

## 2011-01-19 DIAGNOSIS — M25569 Pain in unspecified knee: Secondary | ICD-10-CM | POA: Insufficient documentation

## 2011-01-26 ENCOUNTER — Ambulatory Visit: Payer: Medicaid Other

## 2011-02-23 DIAGNOSIS — R109 Unspecified abdominal pain: Secondary | ICD-10-CM | POA: Insufficient documentation

## 2011-02-25 ENCOUNTER — Ambulatory Visit (INDEPENDENT_AMBULATORY_CARE_PROVIDER_SITE_OTHER): Payer: Medicaid Other | Admitting: Family Medicine

## 2011-02-25 ENCOUNTER — Encounter: Payer: Self-pay | Admitting: Family Medicine

## 2011-02-25 ENCOUNTER — Ambulatory Visit: Payer: Medicaid Other | Admitting: Emergency Medicine

## 2011-02-25 VITALS — BP 147/90 | HR 70 | Temp 98.4°F | Ht 65.0 in | Wt 165.0 lb

## 2011-02-25 DIAGNOSIS — J029 Acute pharyngitis, unspecified: Secondary | ICD-10-CM

## 2011-02-25 DIAGNOSIS — G933 Postviral fatigue syndrome: Secondary | ICD-10-CM | POA: Insufficient documentation

## 2011-02-25 DIAGNOSIS — G9331 Postviral fatigue syndrome: Secondary | ICD-10-CM | POA: Insufficient documentation

## 2011-02-25 DIAGNOSIS — R5382 Chronic fatigue, unspecified: Secondary | ICD-10-CM

## 2011-02-25 NOTE — Progress Notes (Signed)
  Subjective:   1. Fatigue/viral syndrome/pharyngitis  Denise Aguirre is a 51 y.o. female who presents for evaluation of sore throat. Associated symptoms include fevers up to 102 degrees, sore throat and swollen glands. Onset of symptoms was 1 week ago, and have been improved and then slowly got worse since that time. She is not drinking much. She has had a recent close exposure to someone with proven streptococcal pharyngitis and mono. Strep test negative Monospot negative  Review of Systems Pertinent items are noted in HPI. No chills, night sweats, weight loss.  Objective:   Filed Vitals:   02/25/11 1125  BP: 147/90  Pulse: 70  Temp: 98.4 F (36.9 C)  TempSrc: Oral  Height: 5\' 5"  (1.651 m)  Weight: 165 lb (74.844 kg)  Lungs:  Normal respiratory effort, chest expands symmetrically. Lungs are clear to auscultation, no crackles or wheezes. Heart - Regular rate and rhythm.  No murmurs, gallops or rubs.    Abdomen: soft and non-tender without masses, organomegaly or hernias noted.  No guarding or rebound Throat: normal mucosa, no exudate, uvula midline, no redness Neck:  No deformities, thyromegaly, or masses noted.   Supple with full range of motion without pain. Swollen submandibular glands and tender.  Laboratory Strep test done. Results:negative.    Assessment:

## 2011-02-25 NOTE — Assessment & Plan Note (Signed)
Nsaids/tylenol. OTC throat relievers. Fluids, rest, warmth, sleep. Avoid any physical contact that may injure spleen. This could be a CMV variant of Mono.

## 2011-02-25 NOTE — Patient Instructions (Signed)
Viral Syndrome You or your child has Viral Syndrome. It is the most common infection causing "colds" and infections in the nose, throat, sinuses, and breathing tubes. Sometimes the infection causes nausea, vomiting, or diarrhea. The germ that causes the infection is a virus. No antibiotic or other medicine will kill it. There are medicines that you can take to make you or your child more comfortable.  HOME CARE INSTRUCTIONS   Rest in bed until you start to feel better.   If you have diarrhea or vomiting, eat small amounts of crackers and toast. Soup is helpful.   Do not give aspirin or medicine that contains aspirin to children.   Only take over-the-counter or prescription medicines for pain, discomfort, or fever as directed by your caregiver.  SEEK IMMEDIATE MEDICAL CARE IF:   You or your child has not improved within one week.   You or your child has pain that is not at least partially relieved by over-the-counter medicine.   Thick, colored mucus or blood is coughed up.   Discharge from the nose becomes thick yellow or green.   Diarrhea or vomiting gets worse.   There is any major change in your or your child's condition.   You or your child develops a skin rash, stiff neck, severe headache, or are unable to hold down food or fluid.   You or your child has an oral temperature above 102 F (38.9 C), not controlled by medicine.   Your baby is older than 3 months with a rectal temperature of 102 F (38.9 C) or higher.   Your baby is 3 months old or younger with a rectal temperature of 100.4 F (38 C) or higher.  Document Released: 01/17/2006 Document Revised: 10/14/2010 Document Reviewed: 01/18/2007 [employer] Patient Information 2012 [employer]. 

## 2011-03-11 ENCOUNTER — Encounter: Payer: Self-pay | Admitting: Family Medicine

## 2011-03-11 ENCOUNTER — Ambulatory Visit (INDEPENDENT_AMBULATORY_CARE_PROVIDER_SITE_OTHER): Payer: Medicaid Other | Admitting: Family Medicine

## 2011-03-11 VITALS — BP 128/74 | HR 73 | Temp 97.9°F | Ht 65.0 in | Wt 164.3 lb

## 2011-03-11 DIAGNOSIS — J029 Acute pharyngitis, unspecified: Secondary | ICD-10-CM

## 2011-03-11 LAB — POCT RAPID STREP A (OFFICE): Rapid Strep A Screen: NEGATIVE

## 2011-03-11 NOTE — Patient Instructions (Signed)
You are strep test was negative. I am not completely certain what is causing her sore throat, but most of the time it is caused either by nasal drainage and to dry air at night or by reflux. I would like you to take 25 mg of Benadryl every night and also pick up some omeprazole. Take 40 mg of omeprazole daily for the next 2 weeks. I will also recommend a humidifier while you are sleeping. You can also try Chloraseptic spray for the sore throat. Some cough drops also include menthol which can help to numb the throat. All of these are available over-the-counter. If her symptoms persist for another 1-2 weeks, please come back in and we will talk about further testing.

## 2011-03-11 NOTE — Progress Notes (Signed)
Subjective: The patient is a 51 y.o. year old female who presents today for sore throat/swoolen glands.  Presented for this 02/25/11.  Body aches/weakness improved, throat has not.  Primarily in morning and at night.  No fevers/chills.  No problems with reflux/nausea/vomiting.  Objective:  Filed Vitals:   03/11/11 1154  BP: 128/74  Pulse: 73  Temp: 97.9 F (36.6 C)   Gen: NAD HEENT: MMM, slight tonsilar exudate on left, otherwise throat is mildly erythematous but no specific findings.  No cervical adenopathy although the area around the submandibular gland is mildly tender.  No thyromegaly. CV: RRR Resp: CTABL  Assessment/Plan: Continued post-viral syndrome (post-nasal drip causing sore throat).  Tx symptomatically.  Will also rec. Short term trial of prilosec to see if this helps with symptoms as any reflux could be exacerbating sore throat.  Case precepted with Dr. Deirdre Priest.  Please also see individual problems in problem list for problem-specific plans.

## 2011-03-12 ENCOUNTER — Other Ambulatory Visit: Payer: Self-pay | Admitting: Family Medicine

## 2011-03-12 MED ORDER — METHADONE HCL 10 MG PO TABS: 10.0000 mg | ORAL_TABLET | Freq: Two times a day (BID) | ORAL | Status: DC

## 2011-05-05 ENCOUNTER — Ambulatory Visit: Payer: Medicaid Other | Admitting: Family Medicine

## 2011-07-19 ENCOUNTER — Other Ambulatory Visit: Payer: Self-pay | Admitting: Family Medicine

## 2011-07-19 ENCOUNTER — Telehealth: Payer: Self-pay | Admitting: Family Medicine

## 2011-07-19 DIAGNOSIS — Z1231 Encounter for screening mammogram for malignant neoplasm of breast: Secondary | ICD-10-CM

## 2011-07-19 DIAGNOSIS — Z1239 Encounter for other screening for malignant neoplasm of breast: Secondary | ICD-10-CM

## 2011-07-19 NOTE — Telephone Encounter (Signed)
Pt informed. [facility] [name]  

## 2011-07-19 NOTE — Telephone Encounter (Signed)
I ordered it,  Please let her know Thanks LC

## 2011-07-19 NOTE — Telephone Encounter (Signed)
Need an order from provider for mammogram at [facility]  Please put in and let patient know when complete.

## 2011-08-11 ENCOUNTER — Encounter: Payer: Medicaid Other | Admitting: Family Medicine

## 2011-08-24 ENCOUNTER — Ambulatory Visit (HOSPITAL_COMMUNITY)
Admission: RE | Admit: 2011-08-24 | Discharge: 2011-08-24 | Disposition: A | Discharge status: Home / Self Care | Payer: Medicaid Other | Source: Ambulatory Visit | Attending: Family Medicine | Admitting: Family Medicine

## 2011-08-24 DIAGNOSIS — Z1231 Encounter for screening mammogram for malignant neoplasm of breast: Secondary | ICD-10-CM

## 2011-10-27 ENCOUNTER — Ambulatory Visit (INDEPENDENT_AMBULATORY_CARE_PROVIDER_SITE_OTHER): Payer: Medicaid Other | Admitting: Family Medicine

## 2011-10-27 ENCOUNTER — Other Ambulatory Visit (HOSPITAL_COMMUNITY)
Admission: RE | Admit: 2011-10-27 | Discharge: 2011-10-27 | Disposition: A | Discharge status: Home / Self Care | Payer: Medicaid Other | Source: Ambulatory Visit | Attending: Family Medicine | Admitting: Family Medicine

## 2011-10-27 ENCOUNTER — Encounter: Payer: Self-pay | Admitting: Family Medicine

## 2011-10-27 VITALS — BP 104/72 | HR 71 | Ht 65.0 in | Wt 158.0 lb

## 2011-10-27 DIAGNOSIS — Z124 Encounter for screening for malignant neoplasm of cervix: Secondary | ICD-10-CM

## 2011-10-27 DIAGNOSIS — Z13220 Encounter for screening for lipoid disorders: Secondary | ICD-10-CM

## 2011-10-27 DIAGNOSIS — Z01419 Encounter for gynecological examination (general) (routine) without abnormal findings: Secondary | ICD-10-CM | POA: Insufficient documentation

## 2011-10-27 DIAGNOSIS — Z131 Encounter for screening for diabetes mellitus: Secondary | ICD-10-CM

## 2011-10-27 DIAGNOSIS — Z23 Encounter for immunization: Secondary | ICD-10-CM

## 2011-10-27 LAB — LIPID PANEL
Cholesterol: 189 mg/dL (ref 0–200)
Total CHOL/HDL Ratio: 3.2 Ratio
Triglycerides: 77 mg/dL (ref <150)

## 2011-10-27 NOTE — Progress Notes (Signed)
  Subjective:    Patient ID: Denise Aguirre, female    DOB: 1961-02-08, 51 y.o.   MRN: [id number]  HPI  Here for physical exam  No complaints other than  Wart on dorsum of left hand.  No others and no pain or rednesss  Patient reports no  vision/ hearing changes,anorexia, weight change, fever ,adenopathy, persistant / recurrent hoarseness, swallowing issues, chest pain, edema,persistant / recurrent cough, hemoptysis, dyspnea(rest, exertional, paroxysmal nocturnal), gastrointestinal  bleeding (melena, rectal bleeding), abdominal pain, excessive heart burn, GU symptoms(dysuria, hematuria, pyuria, voiding/incontinence  Issues) syncope, focal weakness, severe memory loss, concerning skin lesions, depression, anxiety, abnormal bruising/bleeding, major joint swelling, breast masses or abnormal vaginal bleeding.     Review of Systems     Objective:   Physical Exam  Genitalia:  Normal introitus for age, no external lesions, no vaginal discharge, mucosa pink and moist, no vaginal or cervical lesions, no vaginal atrophy, mild friability of cervix with pap normal uterus size and position, no adnexal masses or tenderness Heart - Regular rate and rhythm.  No murmurs, gallops or rubs.    Lungs:  Normal respiratory effort, chest expands symmetrically. Lungs are clear to auscultation, no crackles or wheezes. Wart - 4 mm wart - Freeze -thaw - freeze without halo     Assessment & Plan:   Normal exam without concerning symptoms See AVS

## 2011-10-27 NOTE — Patient Instructions (Addendum)
Come back if you need more wart treatments.  Call if any signs of infection  I will call you if your tests are not good.  Otherwise I will send you a letter.  If you do not hear from me with in 2 weeks please call our office.     Schedule a colonoscopy  You will need a pap in 3 years if this one is normal  Continue to exercise   Have a great year

## 2011-10-28 ENCOUNTER — Telehealth: Payer: Self-pay | Admitting: Family Medicine

## 2011-10-28 ENCOUNTER — Encounter: Payer: Self-pay | Admitting: Family Medicine

## 2011-10-28 DIAGNOSIS — Z1211 Encounter for screening for malignant neoplasm of colon: Secondary | ICD-10-CM

## 2011-10-28 NOTE — Telephone Encounter (Signed)
Pt was here yesterday and saw Dr Deirdre Priest, she is now exhibiting UTI symptoms and wants to know if she can just come in to give a urine sample [facility] is precepting

## 2011-10-28 NOTE — Telephone Encounter (Signed)
Pt called [facility] to set her colonoscopy appt and they need a referral since she has [place] medicaid

## 2011-10-28 NOTE — Telephone Encounter (Signed)
Patient states she actually developed symptoms of back pain day before yesterday  and attributed it to IC. Now she is having frequency every 45 minutes. Dr. Deirdre Priest consulted and he advises she will need to be seen in order to evaluate and treat. Offered work in appointment today but she does not want to come today to be worked in . She will call back tomorrow if she decides to come tomorrow.

## 2011-10-29 ENCOUNTER — Ambulatory Visit: Payer: Medicaid Other | Admitting: Family Medicine

## 2011-10-29 DIAGNOSIS — Z1211 Encounter for screening for malignant neoplasm of colon: Secondary | ICD-10-CM | POA: Insufficient documentation

## 2011-10-29 NOTE — Telephone Encounter (Signed)
No one has called her.  We must wait for referral to be placed before appt can be made. Fleeger, Maryjo Rochester

## 2011-10-29 NOTE — Telephone Encounter (Signed)
I put in the order  Thanks  LC

## 2011-10-29 NOTE — Telephone Encounter (Signed)
Patient is calling back because the nurse said they would call her back about the referral to [facility].

## 2011-10-29 NOTE — Telephone Encounter (Signed)
Has been placed in [place] workqueue.  They have called and LM for pt to call back and schedule. Fleeger, Maryjo Rochester

## 2011-11-02 ENCOUNTER — Encounter: Payer: Self-pay | Admitting: Gastroenterology

## 2011-11-23 ENCOUNTER — Ambulatory Visit (AMBULATORY_SURGERY_CENTER): Payer: Medicaid Other

## 2011-11-23 VITALS — Ht 65.0 in | Wt 163.0 lb

## 2011-11-23 DIAGNOSIS — Z1211 Encounter for screening for malignant neoplasm of colon: Secondary | ICD-10-CM

## 2011-11-23 MED ORDER — MOVIPREP 100 G PO SOLR: ORAL | Status: DC

## 2011-12-01 ENCOUNTER — Telehealth: Payer: Self-pay | Admitting: Gastroenterology

## 2011-12-01 ENCOUNTER — Encounter: Payer: Self-pay | Admitting: Family Medicine

## 2011-12-01 ENCOUNTER — Ambulatory Visit (INDEPENDENT_AMBULATORY_CARE_PROVIDER_SITE_OTHER): Payer: Medicaid Other | Admitting: Family Medicine

## 2011-12-01 VITALS — BP 133/80 | HR 88 | Temp 98.1°F | Ht 65.0 in | Wt 163.0 lb

## 2011-12-01 DIAGNOSIS — M549 Dorsalgia, unspecified: Secondary | ICD-10-CM

## 2011-12-01 DIAGNOSIS — K5732 Diverticulitis of large intestine without perforation or abscess without bleeding: Secondary | ICD-10-CM

## 2011-12-01 DIAGNOSIS — K5792 Diverticulitis of intestine, part unspecified, without perforation or abscess without bleeding: Secondary | ICD-10-CM

## 2011-12-01 LAB — POCT URINALYSIS DIPSTICK
Bilirubin, UA: NEGATIVE
Ketones, UA: NEGATIVE
Leukocytes, UA: NEGATIVE
Spec Grav, UA: 1.025

## 2011-12-01 LAB — POCT UA - MICROSCOPIC ONLY: Epithelial cells, urine per micros: <5

## 2011-12-01 MED ORDER — METHADONE HCL 10 MG PO TABS: 5.0000 mg | ORAL_TABLET | Freq: Two times a day (BID) | ORAL | Status: DC

## 2011-12-01 MED ORDER — DOXYCYCLINE HYCLATE 100 MG PO CAPS: 100.0000 mg | ORAL_CAPSULE | Freq: Two times a day (BID) | ORAL | Status: DC

## 2011-12-01 MED ORDER — METRONIDAZOLE 500 MG PO TABS: 500.0000 mg | ORAL_TABLET | Freq: Two times a day (BID) | ORAL | Status: DC

## 2011-12-01 NOTE — Patient Instructions (Addendum)
Diverticulitis °A diverticulum is a small pouch or sac on the colon. Diverticulosis is the presence of these diverticula on the colon. Diverticulitis is the irritation (inflammation) or infection of diverticula. °CAUSES  °The colon and its diverticula contain bacteria. If food particles block the tiny opening to a diverticulum, the bacteria inside can grow and cause an increase in pressure. This leads to infection and inflammation and is called diverticulitis. °SYMPTOMS  °· Abdominal pain and tenderness. Usually, the pain is located on the left side of your abdomen. However, it could be located elsewhere. °· Fever. °· Bloating. °· Feeling sick to your stomach (nausea). °· Throwing up (vomiting). °· Abnormal stools. °DIAGNOSIS  °Your caregiver will take a history and perform a physical exam. Since many things can cause abdominal pain, other tests may be necessary. Tests may include: °· Blood tests. °· Urine tests. °· X-ray of the abdomen. °· CT scan of the abdomen. °Sometimes, surgery is needed to determine if diverticulitis or other conditions are causing your symptoms. °TREATMENT  °Most of the time, you can be treated without surgery. Treatment includes: °· Resting the bowels by only having liquids for a few days. As you improve, you will need to eat a low-fiber diet. °· Intravenous (IV) fluids if you are losing body fluids (dehydrated). °· Antibiotic medicines that treat infections may be given. °· Pain and nausea medicine, if needed. °· Surgery if the inflamed diverticulum has burst. °HOME CARE INSTRUCTIONS  °· Try a clear liquid diet (broth, tea, or water for as long as directed by your caregiver). You may then gradually begin a low-fiber diet as tolerated. A low-fiber diet is a diet with less than 10 grams of fiber. Choose the foods below to reduce fiber in the diet: °· White breads, cereals, rice, and pasta. °· Cooked fruits and vegetables or soft fresh fruits and vegetables without the skin. °· Ground or  well-cooked tender beef, ham, veal, lamb, pork, or poultry. °· Eggs and seafood. °· After your diverticulitis symptoms have improved, your caregiver may put you on a high-fiber diet. A high-fiber diet includes 14 grams of fiber for every 1000 calories consumed. For a standard 2000 calorie diet, you would need 28 grams of fiber. Follow these diet guidelines to help you increase the fiber in your diet. It is important to slowly increase the amount fiber in your diet to avoid gas, constipation, and bloating. °· Choose whole-grain breads, cereals, pasta, and brown rice. °· Choose fresh fruits and vegetables with the skin on. Do not overcook vegetables because the more vegetables are cooked, the more fiber is lost. °· Choose more nuts, seeds, legumes, dried peas, beans, and lentils. °· Look for food products that have greater than 3 grams of fiber per serving on the Nutrition Facts label. °· Take all medicine as directed by your caregiver. °· If your caregiver has given you a follow-up appointment, it is very important that you go. Not going could result in lasting (chronic) or permanent injury, pain, and disability. If there is any problem keeping the appointment, call to reschedule. °SEEK MEDICAL CARE IF:  °· Your pain does not improve. °· You have a hard time advancing your diet beyond clear liquids. °· Your bowel movements do not return to normal. °SEEK IMMEDIATE MEDICAL CARE IF:  °· Your pain becomes worse. °· You have an oral temperature above 102° F (38.9° C), not controlled by medicine. °· You have repeated vomiting. °· You have bloody or black, tarry stools. °· Symptoms   that brought you to your caregiver become worse or are not getting better. °MAKE SURE YOU:  °· Understand these instructions. °· Will watch your condition. °· Will get help right away if you are not doing well or get worse. °Document Released: 11/11/2004 Document Revised: 04/26/2011 Document Reviewed: 03/09/2010 °ExitCare® Patient Information  ©2013 [employer]. ° °

## 2011-12-01 NOTE — Telephone Encounter (Signed)
Pt saw PCP today and was diagnosed with diverticulitis. Pt was placed on antibiotics. She is scheduled for a colon on Monday. Pt wants to know if she needs to reschedule. Dr. Russella Dar please advise.

## 2011-12-01 NOTE — Progress Notes (Signed)
  Subjective:    Patient ID: Denise Aguirre, female    DOB: 15-Sep-1960, 51 y.o.   MRN: [id number]  HPI 4 day h/o LLQ pain and abdominal cramping.  Denies diarrhea, fever, N/V.  Has h/o IC and is pain most days.  Has frequent UTI's and wonders if this is that.  Does not feel like nml IC pain.  Reports back pain.  Feels weak and sick.  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   Review of Systems  Constitutional: Positive for activity change and fatigue. Negative for fever.  Gastrointestinal: Positive for abdominal pain. Negative for nausea, vomiting, diarrhea, constipation and blood in stool.  Genitourinary: Positive for frequency. Negative for dysuria and hematuria.  Musculoskeletal: Negative for gait problem.       Objective:   Physical Exam  Vitals reviewed. Constitutional: She is oriented to person, place, and time. She appears well-developed and well-nourished.  HENT:  Head: Normocephalic and atraumatic.  Neck: Neck supple.  Cardiovascular: Normal rate and regular rhythm.   No murmur heard. Pulmonary/Chest: Effort normal and breath sounds normal.  Abdominal: Soft. There is tenderness (LLQ). There is rebound (minimal).  Musculoskeletal:       No CVA tenderness  Neurological: She is alert and oriented to person, place, and time.          Assessment & Plan:

## 2011-12-01 NOTE — Assessment & Plan Note (Addendum)
Exam and hx are strongly suggestive of this diagnosis.  U/A is essentially nml.  Will treat with abx.  She will cancel her colonoscopy until she is feeling better.  Did not use Cipro secondary to interaction with Methadone. (prolonging QT)

## 2011-12-02 NOTE — Telephone Encounter (Signed)
Reschedule colonoscopy for at least 2 weeks beyond finishing antibiotics

## 2011-12-02 NOTE — Telephone Encounter (Signed)
Patient advised.  She wants to call back to reschedule.  She is not sure how many days the antibiotics were prescribed for.  She is advised that it needs to be at least 2 weeks from the completion.  She is also asked to call back if her pain is not completely resolved with the completion of the antibiotics.

## 2011-12-06 ENCOUNTER — Encounter: Payer: Medicaid Other | Admitting: Gastroenterology

## 2011-12-15 ENCOUNTER — Telehealth: Payer: Self-pay | Admitting: Gastroenterology

## 2011-12-15 ENCOUNTER — Encounter: Payer: Self-pay | Admitting: *Deleted

## 2011-12-15 NOTE — Telephone Encounter (Signed)
Filled out moviprep instructions using the new date& time. Mailed to patient's address. Called patient,no answer. Left message.

## 2011-12-17 ENCOUNTER — Telehealth: Payer: Self-pay | Admitting: Gastroenterology

## 2011-12-17 ENCOUNTER — Telehealth: Payer: Self-pay | Admitting: Family Medicine

## 2011-12-17 NOTE — Telephone Encounter (Signed)
Patient is calling having symptoms again of Diverticulitis and her GI MD said that she should have a CT scan.  Since she has Medicaid, the referral would need to come from Dr. Deirdre Priest.  She is hoping to hear something on this today.

## 2011-12-17 NOTE — Telephone Encounter (Signed)
Left voicemail that we can not order a CT over the weekend Will be happy to see her next week.  If she is in a lot of pain should go to the ER

## 2011-12-17 NOTE — Telephone Encounter (Signed)
Patient called to report that she has completed antibiotics that were prescribed by her primary care for diverticulitis on Monday, but the pain returned today.  She is scheduled for a screening colon for 12/31/11 and she wants to know what to do.  She is advised that she needs to call her primary care and advise them of her symptoms.  She is advised that if she is not able to speak with them about her symptoms today, she should be evaluated at an urgent care or ER.  She is advised that I will not cancel the colonoscopy yet.  Diverticulitis was diagnosed on exam and not CT and with the pain returning so quickly she should be seen again to confirm her symptoms are diverticulitis.  She will call back next week if they retreat her for this and we will need to reschedule the colonoscopy for 2 weeks after the completion of the antibiotics as stated in the phone note from 12/01/11.  She inquired about being seen by one of our GI MD for the pain next week.  I have explained to her that she will need a referral from her primary care for this type of visit.  The current referral only covers a screening colon.

## 2011-12-31 ENCOUNTER — Encounter: Payer: Medicaid Other | Admitting: Gastroenterology

## 2012-02-14 ENCOUNTER — Encounter: Payer: Self-pay | Admitting: *Deleted

## 2012-02-14 ENCOUNTER — Telehealth: Payer: Self-pay | Admitting: Gastroenterology

## 2012-02-14 NOTE — Telephone Encounter (Signed)
Printed updated prep instructions and mailed to pt.  Pt will call to review instructions when she receives them in the mail.

## 2012-02-25 ENCOUNTER — Telehealth: Payer: Self-pay | Admitting: Gastroenterology

## 2012-02-25 NOTE — Telephone Encounter (Signed)
Pt ate salad with nuts.  Told to drink a lot of fluids until her procedure to make sure she is cleaned out.

## 2012-02-29 ENCOUNTER — Ambulatory Visit (AMBULATORY_SURGERY_CENTER): Payer: Medicaid Other | Admitting: Gastroenterology

## 2012-02-29 ENCOUNTER — Encounter: Payer: Self-pay | Admitting: Gastroenterology

## 2012-02-29 VITALS — BP 126/68 | HR 72 | Temp 98.0°F | Resp 13 | Ht 65.0 in | Wt 163.0 lb

## 2012-02-29 DIAGNOSIS — Z1211 Encounter for screening for malignant neoplasm of colon: Secondary | ICD-10-CM

## 2012-02-29 DIAGNOSIS — D126 Benign neoplasm of colon, unspecified: Secondary | ICD-10-CM

## 2012-02-29 MED ORDER — SODIUM CHLORIDE 0.9 % IV SOLN: 500.0000 mL | INTRAVENOUS | Status: DC

## 2012-02-29 NOTE — Progress Notes (Signed)
Pt encouraged to pass flatus.  She was careful doing this due to she said when she pressed down she felt like she had urinate.  Once she was on the monitor for 30 mins she was let go to the rest room.  Per the pt she did pass more gas in there.  Maw  Patient did not experience any of the following events: a burn prior to discharge; a fall within the facility; wrong site/side/patient/procedure/implant event; or a hospital transfer or hospital admission upon discharge from the facility. [phone]) Patient did not have preoperative order for IV antibiotic SSI prophylaxis. [phone])

## 2012-02-29 NOTE — Patient Instructions (Addendum)
A handout was given to your care partner on polyps.  Please hold aspirin, aspirin products and any anti-inflammatory medications for two weeks until 03-14-12.  You may resume your other current medications today.    YOU HAD AN ENDOSCOPIC PROCEDURE TODAY AT THE [facility]: Refer to the procedure report that was given to you for any specific questions about what was found during the examination.  If the procedure report does not answer your questions, please call your gastroenterologist to clarify.  If you requested that your care partner not be given the details of your procedure findings, then the procedure report has been included in a sealed envelope for you to review at your convenience later.  YOU SHOULD EXPECT: Some feelings of bloating in the abdomen. Passage of more gas than usual.  Walking can help get rid of the air that was put into your GI tract during the procedure and reduce the bloating. If you had a lower endoscopy (such as a colonoscopy or flexible sigmoidoscopy) you may notice spotting of blood in your stool or on the toilet paper. If you underwent a bowel prep for your procedure, then you may not have a normal bowel movement for a few days.  DIET: Your first meal following the procedure should be a light meal and then it is ok to progress to your normal diet.  A half-sandwich or bowl of soup is an example of a good first meal.  Heavy or fried foods are harder to digest and may make you feel nauseous or bloated.  Likewise meals heavy in dairy and vegetables can cause extra gas to form and this can also increase the bloating.  Drink plenty of fluids but you should avoid alcoholic beverages for 24 hours.  ACTIVITY: Your care partner should take you home directly after the procedure.  You should plan to take it easy, moving slowly for the rest of the day.  You can resume normal activity the day after the procedure however you should NOT DRIVE or use heavy machinery for 24 hours  (because of the sedation medicines used during the test).    SYMPTOMS TO REPORT IMMEDIATELY: A gastroenterologist can be reached at any hour.  During normal business hours, 8:30 AM to 5:00 PM Monday through Friday, call [phone].  After hours and on weekends, please call the GI answering service at [phone] who will take a message and have the physician on call contact you.   Following lower endoscopy (colonoscopy or flexible sigmoidoscopy):  Excessive amounts of blood in the stool  Significant tenderness or worsening of abdominal pains  Swelling of the abdomen that is new, acute  Fever of 100F or higher    FOLLOW UP: If any biopsies were taken you will be contacted by phone or by letter within the next 1-3 weeks.  Call your gastroenterologist if you have not heard about the biopsies in 3 weeks.  Our staff will call the home number listed on your records the next business day following your procedure to check on you and address any questions or concerns that you may have at that time regarding the information given to you following your procedure. This is a courtesy call and so if there is no answer at the home number and we have not heard from you through the emergency physician on call, we will assume that you have returned to your regular daily activities without incident.  SIGNATURES/CONFIDENTIALITY: You and/or your care partner have signed paperwork which  will be entered into your electronic medical record.  These signatures attest to the fact that that the information above on your After Visit Summary has been reviewed and is understood.  Full responsibility of the confidentiality of this discharge information lies with you and/or your care-partner.

## 2012-02-29 NOTE — Progress Notes (Signed)
Called to room to assist during endoscopic procedure.  Patient ID and intended procedure confirmed with present staff. Received instructions for my participation in the procedure from the performing physician.  

## 2012-02-29 NOTE — Progress Notes (Signed)
Pt requested to go to the rest room.  I explained she could not get up out of the bed for 30 mins.  I offered her the bed pan.  She was assited with positioning of the bed pan.  Maw

## 2012-02-29 NOTE — Op Note (Signed)
[facility] [address] [place], [address]   COLONOSCOPY PROCEDURE REPORT PATIENT: Denise Aguirre, Denise Aguirre  MR#: [id number] BIRTHDATE: [birth year] , 51  yrs. old GENDER: Female ENDOSCOPIST: Meryl Dare, MD, [facility] REFERRED [id number] Pauline Good, M.D. PROCEDURE DATE:  02/29/2012 PROCEDURE:   Colonoscopy with snare polypectomy ASA CLASS:   Class II INDICATIONS:average risk screening. MEDICATIONS: MAC sedation, administered by CRNA and propofol (Diprivan) 400mg  IV DESCRIPTION OF PROCEDURE:   After the risks benefits and alternatives of the procedure were thoroughly explained, informed consent was obtained.  A digital rectal exam revealed no abnormalities of the rectum.   The LB [id number]  endoscope was introduced through the anus and advanced to the cecum, which was identified by both the appendix and ileocecal valve. No adverse events experienced.   The quality of the prep was adequate, using MoviPrep  The instrument was then slowly withdrawn as the colon was fully examined.  COLON FINDINGS: A sessile polyp measuring 6 mm in size was found at the cecum.  A polypectomy was performed with a cold snare.  The resection was complete and the polyp tissue was completely retrieved.   A sessile polyp measuring 1.5 cm in size was found in the proximal transverse colon.  A piecemeal polypectomy was performed using snare cautery.  The resection was complete and the polyp tissue was completely retrieved.   A sessile polyp measuring 1 cm in size was found in the distal transverse colon.  A polypectomy was performed using snare cautery.  The resection was complete and the polyp tissue was completely retrieved.   The colon was otherwise normal.  There was no diverticulosis, inflammation, polyps or cancers unless previously stated.  Retroflexed views revealed no abnormalities. The time to cecum=3 minutes 40 seconds. Withdrawal time=16 minutes 25 seconds.  The scope  was withdrawn and the procedure completed. COMPLICATIONS: There were no complications.  ENDOSCOPIC IMPRESSION: 1.   Sessile polyp measuring 6 mm at the cecum; polypectomy performed with a cold snare 2.   Sessile polyp measuring 1.5 cm in the proximal transverse colon; piecemeal polypectomy performed using snare cautery 3.   Sessile polyp measuring 1 cm in the distal transverse colon; polypectomy performed using snare cautery  RECOMMENDATIONS: 1.  Hold aspirin, aspirin products, and anti-inflammatory medication for 2 weeks. 2.  Await pathology results 3.  Repeat colonoscopy in 1 year if prox transverse colon polyp adenomatous; 3 year if any other polyp adenomatous; otherwise 5 years  eSigned:  Meryl Dare, MD, [facility] 02/29/2012 11:27 AM

## 2012-03-01 ENCOUNTER — Telehealth: Payer: Self-pay | Admitting: *Deleted

## 2012-03-01 NOTE — Telephone Encounter (Signed)
Message left

## 2012-03-06 ENCOUNTER — Encounter: Payer: Self-pay | Admitting: Gastroenterology

## 2012-07-26 ENCOUNTER — Other Ambulatory Visit: Payer: Self-pay | Admitting: Family Medicine

## 2012-07-26 DIAGNOSIS — Z1231 Encounter for screening mammogram for malignant neoplasm of breast: Secondary | ICD-10-CM

## 2012-09-07 ENCOUNTER — Ambulatory Visit (HOSPITAL_COMMUNITY): Payer: Medicaid Other

## 2012-09-13 ENCOUNTER — Ambulatory Visit (HOSPITAL_COMMUNITY): Payer: Medicaid Other

## 2012-09-15 ENCOUNTER — Ambulatory Visit (HOSPITAL_COMMUNITY)
Admission: RE | Admit: 2012-09-15 | Discharge: 2012-09-15 | Disposition: A | Discharge status: Home / Self Care | Payer: Medicaid Other | Source: Ambulatory Visit | Attending: Family Medicine | Admitting: Family Medicine

## 2012-09-15 DIAGNOSIS — Z1231 Encounter for screening mammogram for malignant neoplasm of breast: Secondary | ICD-10-CM

## 2012-11-20 ENCOUNTER — Ambulatory Visit (INDEPENDENT_AMBULATORY_CARE_PROVIDER_SITE_OTHER): Payer: Medicaid Other | Admitting: Family Medicine

## 2012-11-20 ENCOUNTER — Encounter: Payer: Self-pay | Admitting: Family Medicine

## 2012-11-20 ENCOUNTER — Telehealth: Payer: Self-pay | Admitting: Family Medicine

## 2012-11-20 VITALS — BP 143/80 | HR 81 | Temp 98.1°F | Ht 65.0 in | Wt 163.0 lb

## 2012-11-20 DIAGNOSIS — R300 Dysuria: Secondary | ICD-10-CM

## 2012-11-20 LAB — POCT URINALYSIS DIPSTICK
Bilirubin, UA: NEGATIVE
Blood, UA: NEGATIVE
Glucose, UA: NEGATIVE
Nitrite, UA: NEGATIVE
Urobilinogen, UA: 0.2

## 2012-11-20 LAB — POCT UA - MICROSCOPIC ONLY

## 2012-11-20 MED ORDER — CEPHALEXIN 250 MG PO CAPS: 250.0000 mg | ORAL_CAPSULE | Freq: Four times a day (QID) | ORAL | Status: DC

## 2012-11-20 NOTE — Assessment & Plan Note (Signed)
Unsure of exact diagnosis but will treat for UTI and check culture

## 2012-11-20 NOTE — Patient Instructions (Addendum)
We will treat you for a uti.  Keflex 500 mg 4 x a day.  If you get high fever or nausea or vomiting come back immediately  If not better in 4 days and your back pain is worsening you should come back  I will call if your culture indicates you are not on the right antibiotic  Good to see you today!  Thanks for coming in.

## 2012-11-20 NOTE — Telephone Encounter (Signed)
Has not been her for appox 1 year - No we would need to see her  Thanks LC

## 2012-11-20 NOTE — Progress Notes (Signed)
  Subjective:    Patient ID: Denise Aguirre, female    DOB: 1961/01/20, 52 y.o.   MRN: [id number]  HPI  Dysuria, Low Back Pain For last week or so.  Feels like prior uti - incomplete empyting and frequency.  Has ISC so hard to tell from chroic symptoms.  No fever or nausea or vomiting or rash   PMH - had cystocele removed in Aug 2014  Review of Systems     Objective:   Physical Exam   Alert no acute distress Abdomen - mild diffuse discomfort.  No guarding or rebound Back - tender lumbar paraspinous muscles no CVAT      Assessment & Plan:

## 2012-11-20 NOTE — Telephone Encounter (Signed)
Appt this afternoon. Denise Aguirre, Denise Aguirre

## 2012-11-20 NOTE — Telephone Encounter (Signed)
Pt sees urlogoist in [place]  Thinks she has UTI or kidney infection. Can she do a urine sample without seeing the dr? Please advise

## 2012-11-21 LAB — URINE CULTURE
Colony Count: NO GROWTH
Organism ID, Bacteria: NO GROWTH

## 2012-11-22 ENCOUNTER — Telehealth: Payer: Self-pay | Admitting: Family Medicine

## 2012-11-22 NOTE — Telephone Encounter (Signed)
LMOVM for pt to return call. Please inform that her culture was negative (no bacteria).  Spoke with Dr. Deirdre Priest and he wanted this message to pt:  1.  If you are feeling better with the antibiotics, keep taking them  2.  If you are still not feeling better you need to see your urologist for bladder pain or if she feels it is related to her back, she needs to come back and see Dr. Deirdre Priest. Fleeger, Maryjo Rochester

## 2012-11-22 NOTE — Telephone Encounter (Signed)
Pt called because she still is not feeling well. She is going to continue with the antibiotics. She was wanting to know if she should have lab work done to check her sugar, thyroid, cholesterol, vitamin D levels, and if she is anemic. JW

## 2012-11-22 NOTE — Telephone Encounter (Signed)
Pt called and would like someone to call her with the results of her lab results. JW

## 2012-11-23 NOTE — Telephone Encounter (Signed)
If she is not feeling better next week she should come in for an office visit and we can address blood work etc Thanks  LC

## 2012-11-23 NOTE — Telephone Encounter (Signed)
LMOVM informing that she should be seen next week if she is still having issues. Jala Dundon, Maryjo Rochester

## 2012-12-13 ENCOUNTER — Encounter: Payer: Self-pay | Admitting: Family Medicine

## 2012-12-13 ENCOUNTER — Ambulatory Visit (INDEPENDENT_AMBULATORY_CARE_PROVIDER_SITE_OTHER): Payer: Medicaid Other | Admitting: Family Medicine

## 2012-12-13 VITALS — BP 112/72 | HR 72 | Temp 98.6°F | Wt 158.0 lb

## 2012-12-13 DIAGNOSIS — Z23 Encounter for immunization: Secondary | ICD-10-CM

## 2012-12-13 DIAGNOSIS — Z13220 Encounter for screening for lipoid disorders: Secondary | ICD-10-CM

## 2012-12-13 DIAGNOSIS — R5381 Other malaise: Secondary | ICD-10-CM

## 2012-12-13 NOTE — Progress Notes (Signed)
  Subjective:    Patient ID: Denise Aguirre, female    DOB: 26-Jul-1960, 52 y.o.   MRN: [id number]  HPI  Fatigue Feels less energy than usual.  Also premenopausal with missing menstrual periods intermittenly.  No weight loss or fever.  No chest pain with exertion.  Does feel some shortness of breath with exercise that she feels is due to deconditioning  Patient reports no  vision/ hearing changes,anorexia, weight change, fever ,adenopathy, persistant / recurrent hoarseness, swallowing issues, chest pain, edema,persistant / recurrent cough, hemoptysis,  gastrointestinal  bleeding (melena, rectal bleeding), abdominal pain, excessive heart burn, syncope, focal weakness, severe memory loss, concerning skin lesions, depression, anxiety, abnormal bruising/bleeding, major joint swelling, breast masses or abnormal vaginal bleeding.     Review of Systems     Objective:   Physical Exam  Neck:  No deformities, thyromegaly, masses, or tenderness noted.   Supple with full range of motion without pain. Heart - Regular rate and rhythm.  No murmurs, gallops or rubs.    Lungs:  Normal respiratory effort, chest expands symmetrically. Lungs are clear to auscultation, no crackles or wheezes. Abdomen: soft and non-tender without masses, organomegaly or hernias noted.  No guarding or rebound Extremities:  No cyanosis, edema, or deformity noted with good range of motion of all major joints.  Has mildly swollen R 2nd toe that she stumped 2 weeks ago - no deformity good range of motion  Skin:  Intact without suspicious lesions or rashes Ears:  External ear exam shows no significant lesions or deformities.  Otoscopic examination reveals clear canals, tympanic membranes are intact bilaterally without bulging, retraction, inflammation or discharge. Hearing is grossly normal bilaterall Mouth - no lesions, mucous membranes are moist, no decaying teeth        Assessment & Plan:

## 2012-12-13 NOTE — Patient Instructions (Signed)
Good to see you today!  Thanks for coming in.  Check mychart for your labs   If all the blood tests are normal - then see how you feel after menopause and with regular exercise

## 2012-12-13 NOTE — Assessment & Plan Note (Signed)
No red flags - likely menopause and normal aging related.  Will check labs and have her resume exercise (had stopped due to surgery and then toe sprain)  Will follow up as needed

## 2012-12-14 ENCOUNTER — Encounter: Payer: Self-pay | Admitting: Family Medicine

## 2012-12-14 LAB — CBC
HCT: 39.1 % (ref 36.0–46.0)
Hemoglobin: 13.5 g/dL (ref 12.0–15.0)
MCHC: 34.5 g/dL (ref 30.0–36.0)
MCV: 87.5 fL (ref 78.0–100.0)

## 2012-12-14 LAB — LIPID PANEL
Cholesterol: 193 mg/dL (ref 0–200)
HDL: 54 mg/dL (ref >39)
Triglycerides: 61 mg/dL (ref <150)

## 2012-12-14 LAB — COMPREHENSIVE METABOLIC PANEL
Albumin: 4.5 g/dL (ref 3.5–5.2)
BUN: 14 mg/dL (ref 6–23)
CO2: 25 mEq/L (ref 19–32)
Glucose, Bld: 103 mg/dL — ABNORMAL HIGH (ref 70–99)
Potassium: 4.2 mEq/L (ref 3.5–5.3)
Sodium: 140 mEq/L (ref 135–145)
Total Bilirubin: 0.5 mg/dL (ref 0.3–1.2)
Total Protein: 7 g/dL (ref 6.0–8.3)

## 2013-03-26 ENCOUNTER — Encounter: Payer: Self-pay | Admitting: Family Medicine

## 2013-03-27 ENCOUNTER — Ambulatory Visit: Payer: Medicaid Other | Admitting: Family Medicine

## 2013-03-29 ENCOUNTER — Other Ambulatory Visit: Payer: Self-pay

## 2013-03-29 ENCOUNTER — Ambulatory Visit (INDEPENDENT_AMBULATORY_CARE_PROVIDER_SITE_OTHER): Payer: Medicaid Other | Admitting: Family Medicine

## 2013-03-29 ENCOUNTER — Ambulatory Visit (HOSPITAL_COMMUNITY)
Admission: RE | Admit: 2013-03-29 | Discharge: 2013-03-29 | Disposition: A | Discharge status: Home / Self Care | Payer: Medicaid Other | Source: Ambulatory Visit | Attending: Family Medicine | Admitting: Family Medicine

## 2013-03-29 ENCOUNTER — Encounter: Payer: Self-pay | Admitting: Family Medicine

## 2013-03-29 VITALS — BP 120/78 | HR 80 | Ht 65.0 in | Wt 158.0 lb

## 2013-03-29 DIAGNOSIS — R0789 Other chest pain: Secondary | ICD-10-CM

## 2013-03-29 DIAGNOSIS — R9431 Abnormal electrocardiogram [ECG] [EKG]: Secondary | ICD-10-CM | POA: Insufficient documentation

## 2013-03-29 NOTE — Progress Notes (Signed)
Subjective:     Patient ID: Mollie Germany, female   DOB: 11/29/1960, 53 y.o.   MRN: [id number]  HPI 53 y.o. F with complaints of chest pressure, SOB with exertion over last 3 days. Pt also reports leg weakness L>R that feels like a band and decreased circulation "like a rubber band around." Also complains of right hand stiffness.  PMHx:  Interstitial cystitis PSHx: BTL, T&A, interstim for bladder Meds: Methadone 5mg  bid All: IV dye  Family Hx: mother passed from neurological disease. No heart disease in family Cortical basal gangline in mother No hx of clotting disorders  Review of Systems No f/c, no n/v, no d/c, no diffuerent urinary symptoms, no heart burn, no headaches, no neck pain, no jaw pain, no diapheresis. No bowel issues.     Objective:   Physical Exam Filed Vitals:   03/29/13 1443  BP: 120/78  Pulse: 80  VSS NAD CTAB no wrc RRR no mgt Non tender abdomen No c/c/e, minmally tender bilaterally in calf. Sensation and strength =. Calf R 34.5 L 35cm     Assessment:     53 y.o. F without risk factors presents with chest tightness, SOB with exertional.  Legs are equal in size, strongly doubt DVT. But may have restless leg syndrome. Recommend stretching and foam roller.   Refer to cards for eval of stress test. Cannot rule out cardiac etiology regarding Chest tightness. EKG reassuring without T wave changes. Chest pain or other symptoms change go directly to ED. Pt states understanding.  Fredrik Rigger, MD OB Fellow

## 2013-03-29 NOTE — Patient Instructions (Signed)
Restless Legs Syndrome Restless legs syndrome is a movement disorder. It may also be called a sensori-motor disorder.  CAUSES  No one knows what specifically causes restless legs syndrome, but it tends to run in families. It is also more common in people with low iron, in pregnancy, in people who need dialysis, and those with nerve damage (neuropathy).Some medications may make restless legs syndrome worse.Those medications include drugs to treat high blood pressure, some heart conditions, nausea, colds, allergies, and depression. SYMPTOMS Symptoms include uncomfortable sensations in the legs. These leg sensations are worse during periods of inactivity or rest. They are also worse while sitting or lying down. Individuals that have the disorder describe sensations in the legs that feel like:  Pulling.  Drawing.  Crawling.  Worming.  Boring.  Tingling.  Pins and needles.  Prickling.  Pain. The sensations are usually accompanied by an overwhelming urge to move the legs. Sudden muscle jerks may also occur. Movement provides temporary relief from the discomfort. In rare cases, the arms may also be affected. Symptoms may interfere with going to sleep (sleep onset insomnia). Restless legs syndrome may also be related to periodic limb movement disorder (PLMD). PLMD is another more common motor disorder. It also causes interrupted sleep. The symptoms from PLMD usually occur most often when you are awake. TREATMENT  Treatment for restless legs syndrome is symptomatic. This means that the symptoms are treated.   Massage and cold compresses may provide temporary relief.  Walk, stretch, or take a cold or hot bath.  Get regular exercise and a good night's sleep.  Avoid caffeine, alcohol, nicotine, and medications that can make it worse.  Do activities that provide mental stimulation like discussions, needlework, and video games. These may be helpful if you are not able to walk or  stretch. Some medications are effective in relieving the symptoms. However, many of these medications have side effects. Ask your caregiver about medications that may help your symptoms. Correcting iron deficiency may improve symptoms for some patients. Document Released: 01/22/2002 Document Revised: 04/26/2011 Document Reviewed: 04/30/2010 [employer] Patient Information 2014 [employer].  

## 2013-04-02 ENCOUNTER — Telehealth: Payer: Self-pay | Admitting: Family Medicine

## 2013-04-02 DIAGNOSIS — Z1321 Encounter for screening for nutritional disorder: Secondary | ICD-10-CM

## 2013-04-02 NOTE — Telephone Encounter (Signed)
Would like to know what her Vitamin D level was at Oct blood work

## 2013-04-02 NOTE — Telephone Encounter (Signed)
Informed pt that there was no vitamin d drawn.  She would like an order placed for this. Sabine Tenenbaum,CMA

## 2013-04-05 ENCOUNTER — Encounter: Payer: Self-pay | Admitting: *Deleted

## 2013-04-05 ENCOUNTER — Encounter: Payer: Self-pay | Admitting: Cardiovascular Disease

## 2013-04-06 ENCOUNTER — Ambulatory Visit: Payer: Medicaid Other | Admitting: Cardiovascular Disease

## 2013-04-06 DIAGNOSIS — Z1321 Encounter for screening for nutritional disorder: Secondary | ICD-10-CM | POA: Insufficient documentation

## 2013-04-06 NOTE — Telephone Encounter (Signed)
LM for pt to call back and schedule a lab appt for this.  Future placed by Dr. Erin Hearing. Rebie Peale,CMA

## 2013-04-06 NOTE — Telephone Encounter (Signed)
Placed lab for future vit d

## 2013-04-06 NOTE — Addendum Note (Signed)
Addended by: Talbert Cage L on: 04/06/2013 03:22 PM   Modules accepted: Orders

## 2013-04-15 ENCOUNTER — Encounter (HOSPITAL_COMMUNITY): Payer: Self-pay | Admitting: Emergency Medicine

## 2013-04-15 ENCOUNTER — Emergency Department (HOSPITAL_COMMUNITY)
Admission: EM | Admit: 2013-04-15 | Discharge: 2013-04-15 | Disposition: A | Discharge status: Home / Self Care | Payer: Medicaid Other | Attending: Emergency Medicine | Admitting: Emergency Medicine

## 2013-04-15 ENCOUNTER — Emergency Department (HOSPITAL_COMMUNITY): Payer: Medicaid Other

## 2013-04-15 DIAGNOSIS — W010XXA Fall on same level from slipping, tripping and stumbling without subsequent striking against object, initial encounter: Secondary | ICD-10-CM | POA: Insufficient documentation

## 2013-04-15 DIAGNOSIS — S52501A Unspecified fracture of the lower end of right radius, initial encounter for closed fracture: Secondary | ICD-10-CM

## 2013-04-15 DIAGNOSIS — Y9389 Activity, other specified: Secondary | ICD-10-CM | POA: Insufficient documentation

## 2013-04-15 DIAGNOSIS — S52599A Other fractures of lower end of unspecified radius, initial encounter for closed fracture: Secondary | ICD-10-CM | POA: Insufficient documentation

## 2013-04-15 DIAGNOSIS — Z8719 Personal history of other diseases of the digestive system: Secondary | ICD-10-CM | POA: Insufficient documentation

## 2013-04-15 DIAGNOSIS — Z87891 Personal history of nicotine dependence: Secondary | ICD-10-CM | POA: Insufficient documentation

## 2013-04-15 DIAGNOSIS — Z87448 Personal history of other diseases of urinary system: Secondary | ICD-10-CM | POA: Insufficient documentation

## 2013-04-15 DIAGNOSIS — Y929 Unspecified place or not applicable: Secondary | ICD-10-CM | POA: Insufficient documentation

## 2013-04-15 DIAGNOSIS — Z79899 Other long term (current) drug therapy: Secondary | ICD-10-CM | POA: Insufficient documentation

## 2013-04-15 MED ORDER — MORPHINE SULFATE 4 MG/ML IJ SOLN
4.0000 mg | Freq: Once | INTRAMUSCULAR | Status: AC
  Administered 2013-04-15: 4 mg via INTRAVENOUS
  Filled 2013-04-15: qty 1

## 2013-04-15 MED ORDER — METHOCARBAMOL 500 MG PO TABS: 500.0000 mg | ORAL_TABLET | Freq: Two times a day (BID) | ORAL | Status: DC

## 2013-04-15 MED ORDER — ONDANSETRON HCL 4 MG/2ML IJ SOLN
4.0000 mg | Freq: Once | INTRAMUSCULAR | Status: AC
  Administered 2013-04-15: 4 mg via INTRAVENOUS
  Filled 2013-04-15: qty 2

## 2013-04-15 MED ORDER — OXYCODONE HCL 5 MG PO CAPS: 5.0000 mg | ORAL_CAPSULE | ORAL | Status: DC | PRN

## 2013-04-15 MED ORDER — DIAZEPAM 5 MG/ML IJ SOLN
10.0000 mg | Freq: Once | INTRAMUSCULAR | Status: AC
  Administered 2013-04-15: 1 mg via INTRAVENOUS
  Administered 2013-04-15 (×2): 2 mg via INTRAVENOUS
  Filled 2013-04-15: qty 2

## 2013-04-15 MED ORDER — CEPHALEXIN 500 MG PO CAPS: 500.0000 mg | ORAL_CAPSULE | Freq: Four times a day (QID) | ORAL | Status: DC

## 2013-04-15 MED ORDER — OXYCODONE HCL 5 MG PO CAPS: 10.0000 mg | ORAL_CAPSULE | ORAL | Status: DC | PRN

## 2013-04-15 MED ORDER — METHOCARBAMOL 750 MG PO TABS: 750.0000 mg | ORAL_TABLET | Freq: Four times a day (QID) | ORAL | Status: DC | PRN

## 2013-04-15 NOTE — ED Notes (Signed)
Pt was walking her dog when she he pulled on leash and she slipped on ice and fell. Pt c/o right wrist pain and states she is unable to straighten it.

## 2013-04-15 NOTE — ED Notes (Signed)
Bed: VO59 Expected date:  Expected time:  Means of arrival:  Comments: Dr. Amedeo Plenty

## 2013-04-15 NOTE — ED Notes (Signed)
Wrist casted. Abrasions on fingers cleaned. Post reduction xray being performed at bedside.

## 2013-04-15 NOTE — ED Notes (Signed)
Wrist reduced by Amedeo Plenty, MD. Currently being dressed and casted.

## 2013-04-15 NOTE — Consult Note (Signed)
Reason for Consult: Right distal radius fracture comminuted complex  Referring Physician: ER staff  Denise Aguirre is an 53 y.o. female.  HPI: Fracture today about the right upper extremity after being pulled to the ground by her American bulldog. Patient complains of significant pain. Patient notes no other injuries. She denies neck back chest or abdominal pain. Past medical and surgical history of been reviewed.  She has no prior injury to the wrist. I reviewed all issues with her at length. I should note she does take 5 mg of methadone a day do to interstitial cystitis  No pelvic lower extremity neck or back pain today. Left upper extremity is neurovascularly intact with IV access.  Past Medical History  Diagnosis Date  . Cystitis, interstitial     chronic since 1999  . Diverticulitis   . Other malaise and fatigue   . Screening cholesterol level     Past Surgical History  Procedure Laterality Date  . Tubal ligation      1995  . Tonsillectomy      1980  . Interstim implant placement      and removal in 2008    Family History  Problem Relation Age of Onset  . Stroke Father   . Breast cancer Maternal Grandmother     Social History:  reports that she quit smoking about 27 years ago. She has never used smokeless tobacco. She reports that she does not drink alcohol or use illicit drugs.  Allergies:  Allergies  Allergen Reactions  . Ivp Dye [Iodinated Diagnostic Agents]      Hives. itching    Medications: I have reviewed the patient's current medications.  No results found for this or any previous visit (from the past 48 hour(s)).  Dg Forearm Right  04/15/2013   CLINICAL DATA:  Pain post fall  EXAM: RIGHT FOREARM - 2 VIEW  COMPARISON:  None.  FINDINGS: Transverse minimally comminuted distal radial fracture with dorsal angulation of the distal radial articular surface. Ulna appears intact. Proximal radius unremarkable.  IMPRESSION: Angulated comminuted distal radial  fracture   Electronically Signed   By: Arne Cleveland M.D.   On: 04/15/2013 14:10   Dg Wrist Complete Right  04/15/2013   CLINICAL DATA:  Pain post fall  EXAM: RIGHT WRIST - COMPLETE 3+ VIEW  COMPARISON:  None.  FINDINGS: Transverse comminuted fracture of the distal radial metaphysis, impacted with dorsal angulation of the distal radial articular surface. No definite fracture line extension to the radiocarpal joint. There is a subtle lucency across the tip of the ulnar styloid possibly a nondisplaced avulsion type fracture. Carpal rows intact. Normal mineralization and alignment elsewhere.  IMPRESSION: 1. Angulated comminuted distal radial fracture 2. Possible minimally displaced ulnar styloid avulsion.   Electronically Signed   By: Arne Cleveland M.D.   On: 04/15/2013 14:12   Dg Hand Complete Right  04/15/2013   CLINICAL DATA:  Pain post fall  EXAM: RIGHT HAND - COMPLETE 3+ VIEW  COMPARISON:  None.  FINDINGS: Transverse minimally comminuted fracture across the distal radial metaphysis, with dorsal angulation of the distal radial articular surface, and some impaction of fracture fragments. No definite disruption of the subchondral cortex of the distal radius. Ulna appears intact. Carpal rows intact. Normal mineralization and alignment elsewhere.  IMPRESSION: 1. Comminuted angulated distal radial fracture   Electronically Signed   By: Arne Cleveland M.D.   On: 04/15/2013 14:09    Review of Systems  Constitutional: Negative.   Eyes: Negative.  Respiratory: Negative.   Cardiovascular: Negative.   Gastrointestinal: Negative.   Genitourinary: Negative.   Neurological: Negative.   Endo/Heme/Allergies: Negative.    Blood pressure 145/75, pulse 81, temperature 98.5 F (36.9 C), temperature source Oral, resp. rate 16, SpO2 97.00%. Physical Exam white female alert and oriented. She has a deformity about the right wrist with noted interarticular fracture with comminution greater than 3 part about the  radius  She has no evidence of infection compartment syndrome or dystrophic reaction.  Neck is nontender to  Back is nontender  Pelvis is nontender.  Chest equal expansion no shortness of breath. Heart regular rate. Abdomen nontender nondistended.  She walks with nonantalgic gait.  She has some degree of abrasion to her fingertips about the middle and ring as well as broken nails.  I reviewed all issues with her at length. Fortunately there is no evidence of open fracture Assessment/Plan: #1 closed acute right distal radius fracture comminuted complex greater than 3 part #2 multiple abrasions right hand #3 medical problems as listed above   ..04/15/2013  4:03 PM  PATIENT:  Denise Aguirre    PRE-OPERATIVE DIAGNOSIS:  Comminuted right wrist fracture greater than 3 part  POST-OPERATIVE DIAGNOSIS:  Same  PROCEDURE:  Closed reduction distal wrist fracture right upper extremity  SURGEON:  Paulene Floor, MD  PHYSICIAN ASSISTANT: None  ANESTHESIA:   Hematoma block with IV sedation (morphine and value him please see chart  PREOPERATIVE INDICATIONS:  NUR Aguirre is a  53 y.o. female with a diagnosis of   The risks benefits and alternatives were discussed with the patient preoperatively including but not limited to the risks of infection, bleeding, nerve injury, cardiopulmonary complications, the need for revision surgery, among others, and the patient was willing to proceed.   OPERATIVE PROCEDURE:  The risks benefits and alternatives were discussed with the patient preoperatively including but not limited to the risks of infection, bleeding, nerve injury, cardiopulmonary complications, the need for revision surgery, among others, and the patient was willing to proceed.   OPERATIVE PROCEDURE: The patient was seen and counseled in regard to the upper extremity predicament. Following this the extremity underwent a hematoma block with lidocaine without  epinephrine. I sterilely prepped and draped a skin prior to doing so. Once this was accomplished the patient was placed in fingertrap traction and underwent a reduction of the distal radius. The fracture was reduced and following this a sugar tong splint/cast was applied without difficulty. The neurovascular status showed no evidence of compartment syndrome, dystrophy or infection. the patient had pink fingertips, excellent refill and no complications.  We have discussed with patient elevation,  range of motion to the fingers, massage and other measures to prevent neurovascular problems.  Once again we plan to proceed with ice elevation move and massage fingers. Postreduction x-ray showed improved position of the fracture. Will plan for serial radiographs and fixation based on the amount of comminution and progressive angulatory collapse as well as wrist Apgar scores. Patient understands these don'ts etc.  We will plan elevation range of motion edema control. Him and placed on Keflex for the abrasions.  I'm and I have her set up for outpatient elective ORIF at a mutually convenient time in the future. She understands risk and benefits and desires to proceed.  Should any problems arise she'll notify us immediately.  I discussed with her all issues. She tolerated the close reduction well.    I performed I&D of skin is obtained tissue less  than 20 cm about fingers and she tolerated this beautifully. This was about the ring and middle finger she'll continue the Neosporin to these areas.  Paulene Floor 04/15/2013, 3:59 PM

## 2013-04-15 NOTE — Discharge Instructions (Signed)
Please elevate move your fingers frequently. Please call for any problems. Please change the bandage in your ring and middle finger daily and apply Neosporin.  We would recommend getting a cast cover.  Please keep your bandage clean and dry.   Keep bandage clean and dry.  Call for any problems.  No smoking.  Criteria for driving a car: you should be off your pain medicine for 7-8 hours, able to drive one handed(confident), thinking clearly and feeling able in your judgement to drive. Continue elevation as it will decrease swelling.  If instructed by MD move your fingers within the confines of the bandage/splint.  Use ice if instructed by your MD. Call immediately for any sudden loss of feeling in your hand/arm or change in functional abilities of the extremity.   We recommend that you to take vitamin C 1000 mg a day to promote healing we also recommend that if you require her pain medicine that he take a stool softener to prevent constipation as most pain medicines will have constipation side effects. We recommend either Peri-Colace or Senokot and recommend that you also consider adding MiraLAX to prevent the constipation affects from pain medicine if you are required to use them. These medicines are over the counter and maybe purchased at a local pharmacy.

## 2013-04-15 NOTE — ED Notes (Signed)
Pt was walking her 100 lb dog and slipped on the ice.  Pt broke her fall with her right arm.  Pt has swelling and deformity to right wrist.  She is able to wiggle fingers on her right hand, but cannot flex wrist.  Pt denies hitting her head of LOC.

## 2013-04-15 NOTE — ED Provider Notes (Signed)
CSN: [id number]     Arrival date & time 04/15/13  1248 History   First MD Initiated Contact with Patient 04/15/13 1340     Chief Complaint  Patient presents with  . Hand Injury     (Consider location/radiation/quality/duration/timing/severity/associated sxs/prior Treatment) HPI Comments: Patient was walking her dog and the dog pulled her, causing her to slip on ice and fall. She braced her fall with her right arm, fell onto the outstretched arm. She reports moderate to severe pain at the wrist with deformity and decreased movement. No head injury or loss of consciousness. No neck or back pain.  Patient is a 53 y.o. female presenting with hand injury.  Hand Injury   Past Medical History  Diagnosis Date  . Cystitis, interstitial     chronic since 1999  . Diverticulitis   . Other malaise and fatigue   . Screening cholesterol level    Past Surgical History  Procedure Laterality Date  . Tubal ligation      1995  . Tonsillectomy      1980  . Interstim implant placement      and removal in 2008   Family History  Problem Relation Age of Onset  . Stroke Father   . Breast cancer Maternal Grandmother    History  Substance Use Topics  . Smoking status: Former Smoker    Quit date: 11/22/1985  . Smokeless tobacco: Never Used  . Alcohol Use: No   OB History   Grav Para Term Preterm Abortions TAB SAB Ect Mult Living                 Review of Systems  Musculoskeletal:       Arm pain  All other systems reviewed and are negative.      Allergies  Ivp dye  Home Medications   Current Outpatient Rx  Name  Route  Sig  Dispense  Refill  . ibuprofen (ADVIL,MOTRIN) 200 MG tablet   Oral   Take 400 mg by mouth every 6 (six) hours as needed for moderate pain.         . methadone (DOLOPHINE) 10 MG tablet   Oral   Take 0.5 tablets (5 mg total) by mouth 2 (two) times daily.   30 tablet        Per her urologist    BP 145/75  Pulse 81  Temp(Src) 98.5 F (36.9 C) (Oral)   Resp 16  SpO2 97% Physical Exam  Constitutional: She is oriented to person, place, and time. She appears well-developed and well-nourished. No distress.  HENT:  Head: Normocephalic and atraumatic.  Right Ear: Hearing normal.  Left Ear: Hearing normal.  Nose: Nose normal.  Mouth/Throat: Oropharynx is clear and moist and mucous membranes are normal.  Eyes: Conjunctivae and EOM are normal. Pupils are equal, round, and reactive to light.  Neck: Normal range of motion. Neck supple.  Cardiovascular: Regular rhythm, S1 normal and S2 normal.  Exam reveals no gallop and no friction rub.   No murmur heard. Pulmonary/Chest: Effort normal and breath sounds normal. No respiratory distress. She exhibits no tenderness.  Abdominal: Soft. Normal appearance and bowel sounds are normal. There is no hepatosplenomegaly. There is no tenderness. There is no rebound, no guarding, no tenderness at McBurney's point and negative Murphy's sign. No hernia.  Musculoskeletal:       Right wrist: She exhibits decreased range of motion, tenderness, bony tenderness and deformity.  Neurological: She is alert and oriented to person, place,  and time. She has normal strength. No cranial nerve deficit or sensory deficit. Coordination normal. GCS eye subscore is 4. GCS verbal subscore is 5. GCS motor subscore is 6.  Skin: Skin is warm, dry and intact. No rash noted. No cyanosis.  Psychiatric: She has a normal mood and affect. Her speech is normal and behavior is normal. Thought content normal.    ED Course  Procedures (including critical care time) Labs Review Labs Reviewed  CBC WITH DIFFERENTIAL  BASIC METABOLIC PANEL   Imaging Review Dg Forearm Right  04/15/2013   CLINICAL DATA:  Pain post fall  EXAM: RIGHT FOREARM - 2 VIEW  COMPARISON:  None.  FINDINGS: Transverse minimally comminuted distal radial fracture with dorsal angulation of the distal radial articular surface. Ulna appears intact. Proximal radius unremarkable.   IMPRESSION: Angulated comminuted distal radial fracture   Electronically Signed   By: Arne Cleveland M.D.   On: 04/15/2013 14:10   Dg Wrist Complete Right  04/15/2013   CLINICAL DATA:  Pain post fall  EXAM: RIGHT WRIST - COMPLETE 3+ VIEW  COMPARISON:  None.  FINDINGS: Transverse comminuted fracture of the distal radial metaphysis, impacted with dorsal angulation of the distal radial articular surface. No definite fracture line extension to the radiocarpal joint. There is a subtle lucency across the tip of the ulnar styloid possibly a nondisplaced avulsion type fracture. Carpal rows intact. Normal mineralization and alignment elsewhere.  IMPRESSION: 1. Angulated comminuted distal radial fracture 2. Possible minimally displaced ulnar styloid avulsion.   Electronically Signed   By: Arne Cleveland M.D.   On: 04/15/2013 14:12   Dg Hand Complete Right  04/15/2013   CLINICAL DATA:  Pain post fall  EXAM: RIGHT HAND - COMPLETE 3+ VIEW  COMPARISON:  None.  FINDINGS: Transverse minimally comminuted fracture across the distal radial metaphysis, with dorsal angulation of the distal radial articular surface, and some impaction of fracture fragments. No definite disruption of the subchondral cortex of the distal radius. Ulna appears intact. Carpal rows intact. Normal mineralization and alignment elsewhere.  IMPRESSION: 1. Comminuted angulated distal radial fracture   Electronically Signed   By: Arne Cleveland M.D.   On: 04/15/2013 14:09     EKG Interpretation None      MDM   Final diagnoses:  Distal radius fracture, right    Presents to the ER for evaluation of right wrist injury from a fall. X-ray does confirm a comminuted, angulated fracture of the distal radius. Case discussed with Doctor Veronia Beets, will see patient in ER and perform reduction.    Orpah Greek, MD 04/15/13 1501

## 2013-04-17 ENCOUNTER — Telehealth: Payer: Self-pay | Admitting: *Deleted

## 2013-04-17 NOTE — Telephone Encounter (Signed)
[facility] called to request NPI number for patient.  Pt has appt due to a fracture.  NPI given x 4 visits.  Derl Barrow, RN

## 2013-04-18 ENCOUNTER — Other Ambulatory Visit: Payer: Medicaid Other

## 2013-04-19 ENCOUNTER — Ambulatory Visit: Payer: Medicaid Other | Admitting: Cardiovascular Disease

## 2013-04-25 NOTE — Telephone Encounter (Signed)
Erasmo Downer with [facility] given NPI and 4 visits

## 2013-05-01 ENCOUNTER — Encounter: Payer: Self-pay | Admitting: Cardiovascular Disease

## 2013-05-07 ENCOUNTER — Encounter: Payer: Self-pay | Admitting: Family Medicine

## 2013-05-07 ENCOUNTER — Ambulatory Visit (INDEPENDENT_AMBULATORY_CARE_PROVIDER_SITE_OTHER): Payer: Medicaid Other | Admitting: Family Medicine

## 2013-05-07 VITALS — BP 150/88 | HR 88 | Temp 98.1°F | Ht 65.0 in | Wt 152.0 lb

## 2013-05-07 DIAGNOSIS — H6120 Impacted cerumen, unspecified ear: Secondary | ICD-10-CM

## 2013-05-07 DIAGNOSIS — Z1329 Encounter for screening for other suspected endocrine disorder: Secondary | ICD-10-CM

## 2013-05-07 DIAGNOSIS — Z13228 Encounter for screening for other metabolic disorders: Secondary | ICD-10-CM

## 2013-05-07 DIAGNOSIS — S62101A Fracture of unspecified carpal bone, right wrist, initial encounter for closed fracture: Secondary | ICD-10-CM | POA: Insufficient documentation

## 2013-05-07 DIAGNOSIS — S62109A Fracture of unspecified carpal bone, unspecified wrist, initial encounter for closed fracture: Secondary | ICD-10-CM

## 2013-05-07 DIAGNOSIS — Z1321 Encounter for screening for nutritional disorder: Secondary | ICD-10-CM

## 2013-05-07 DIAGNOSIS — Z130 Encounter for screening for diseases of the blood and blood-forming organs and certain disorders involving the immune mechanism: Secondary | ICD-10-CM

## 2013-05-07 NOTE — Assessment & Plan Note (Addendum)
Check dexa scan and vitamin D

## 2013-05-07 NOTE — Progress Notes (Signed)
    Subjective:    Patient ID: Denise Aguirre is a 53 y.o. female presenting with vitamin D  on 05/07/2013  HPI: Pt. Fell down steps on ice walking dog.  Had a [place], then needed ORIF.  Having pain and swelling still.  She will see them this Wednesday and go for PT. Has strong f/h of osteoporosis. Ears feel muffled and having difficulty hearing from them.  Review of Systems  Constitutional: Negative for fever and chills.  Respiratory: Negative for shortness of breath.   Cardiovascular: Negative for chest pain.  Gastrointestinal: Negative for nausea, vomiting and abdominal pain.  Genitourinary: Negative for dysuria.  Skin: Negative for rash.      Objective:    BP 150/88  Pulse 88  Temp(Src) 98.1 F (36.7 C) (Oral)  Ht 5\' 5"  (1.651 m)  Wt 152 lb (68.947 kg)  BMI 25.29 kg/m2 Physical Exam  Constitutional: She is oriented to person, place, and time. She appears well-developed and well-nourished. No distress.  HENT:  Head: Normocephalic and atraumatic.  Cerumen impaction bilaterally  Eyes: No scleral icterus.  Neck: Neck supple.  Cardiovascular: Normal rate.   Pulmonary/Chest: Effort normal.  Abdominal: Soft.  Musculoskeletal: She exhibits edema.  ecchymosis  Neurological: She is alert and oriented to person, place, and time.  Skin: Skin is warm and dry.  Psychiatric: She has a normal mood and affect.        Assessment & Plan:  Wrist fracture, right Check dexa scan and vitamin D  Encounter for vitamin deficiency screening - Plan: DG Bone Density, Vitamin D, 25-hydroxy  Wrist fracture, right  Cerumen impaction S/p irrigation with good results.  Return in about 3 months (around 08/07/2013) for a follow-up.

## 2013-05-07 NOTE — Patient Instructions (Addendum)
Vitamin D Deficiency Vitamin D is an important vitamin that your body needs. Having too little of it in your body is called a deficiency. A very bad deficiency can make your bones soft and can cause a condition called rickets.  Vitamin D is important to your body for different reasons, such as:   It helps your body absorb 2 minerals called calcium and phosphorus.  It helps make your bones healthy.  It may prevent some diseases, such as diabetes and multiple sclerosis.  It helps your muscles and heart. You can get vitamin D in several ways. It is a natural part of some foods. The vitamin is also added to some dairy products and cereals. Some people take vitamin D supplements. Also, your body makes vitamin D when you are in the sun. It changes the sun's rays into a form of the vitamin that your body can use. CAUSES   Not eating enough foods that contain vitamin D.  Not getting enough sunlight.  Having certain digestive system diseases that make it hard to absorb vitamin D. These diseases include Crohn's disease, chronic pancreatitis, and cystic fibrosis.  Having a surgery in which part of the stomach or small intestine is removed.  Being obese. Fat cells pull vitamin D out of your blood. That means that obese people may not have enough vitamin D left in their blood and in other body tissues.  Having chronic kidney or liver disease. RISK FACTORS Risk factors are things that make you more likely to develop a vitamin D deficiency. They include:  Being older.  Not being able to get outside very much.  Living in a nursing home.  Having had broken bones.  Having weak or thin bones (osteoporosis).  Having a disease or condition that changes how your body absorbs vitamin D.  Having dark skin.  Some medicines such as seizure medicines or steroids.  Being overweight or obese. SYMPTOMS Mild cases of vitamin D deficiency may not have any symptoms. If you have a very bad case, symptoms  may include:  Bone pain.  Muscle pain.  Falling often.  Broken bones caused by a minor injury, due to osteoporosis. DIAGNOSIS A blood test is the best way to tell if you have a vitamin D deficiency. TREATMENT Vitamin D deficiency can be treated in different ways. Treatment for vitamin D deficiency depends on what is causing it. Options include:  Taking vitamin D supplements.  Taking a calcium supplement. Your caregiver will suggest what dose is best for you. HOME CARE INSTRUCTIONS  Take any supplements that your caregiver prescribes. Follow the directions carefully. Take only the suggested amount.  Have your blood tested 2 months after you start taking supplements.  Eat foods that contain vitamin D. Healthy choices include:  Fortified dairy products, cereals, or juices. Fortified means vitamin D has been added to the food. Check the label on the package to be sure.  Fatty fish like salmon or trout.  Eggs.  Oysters.  Do not use a tanning bed.  Keep your weight at a healthy level. Lose weight if you need to.  Keep all follow-up appointments. Your caregiver will need to perform blood tests to make sure your vitamin D deficiency is going away. SEEK MEDICAL CARE IF:  You have any questions about your treatment.  You continue to have symptoms of vitamin D deficiency.  You have nausea or vomiting.  You are constipated.  You feel confused.  You have severe abdominal or back pain. MAKE   SURE YOU:  Understand these instructions.  Will watch your condition.  Will get help right away if you are not doing well or get worse. Document Released: 04/26/2011 Document Revised: 05/29/2012 Document Reviewed: 04/26/2011 [id number] Patient Information 2014 [place]. Cerumen Impaction A cerumen impaction is when the wax in your ear forms a plug. This plug usually causes reduced hearing. Sometimes it also causes an earache or dizziness. Removing a cerumen impaction can be  difficult and painful. The wax sticks to the ear canal. The canal is sensitive and bleeds easily. If you try to remove a heavy wax buildup with a cotton tipped swab, you may push it in further. Irrigation with water, suction, and small ear curettes may be used to clear out the wax. If the impaction is fixed to the skin in the ear canal, ear drops may be needed for a few days to loosen the wax. People who build up a lot of wax frequently can use ear wax removal products available in your local drugstore. SEEK MEDICAL CARE IF:  You develop an earache, increased hearing loss, or marked dizziness. Document Released: 03/11/2004 Document Revised: 04/26/2011 Document Reviewed: 05/01/2009 [facility] Patient Information 2014 [place], [place].

## 2013-05-08 LAB — VITAMIN D 25 HYDROXY (VIT D DEFICIENCY, FRACTURES): VIT D 25 HYDROXY: 25 ng/mL — AB (ref 30–89)

## 2013-05-09 ENCOUNTER — Telehealth: Payer: Self-pay | Admitting: Family Medicine

## 2013-05-09 DIAGNOSIS — E559 Vitamin D deficiency, unspecified: Secondary | ICD-10-CM

## 2013-05-09 NOTE — Telephone Encounter (Signed)
Can you please clarify the prescription for patient?  It looks to me that the lab was reordered.  If I need to look somewhere else for this rx please let me know.  Thanks [employer]

## 2013-05-09 NOTE — Telephone Encounter (Signed)
Vitamin D prescription sent to [facility].

## 2013-05-09 NOTE — Telephone Encounter (Signed)
Pt called and would like the doctor to call in a prescription for vitamin D. jw

## 2013-05-10 MED ORDER — VITAMIN D 1000 UNITS PO TABS: 1000.0000 [IU] | ORAL_TABLET | Freq: Every day | ORAL | Status: DC

## 2013-05-10 NOTE — Telephone Encounter (Signed)
Pt is aware of this. Jazmin Hartsell,CMA  

## 2013-05-10 NOTE — Addendum Note (Signed)
Addended byWendy Poet, Henson Fraticelli D on: 05/10/2013 08:20 AM   Modules accepted: Orders

## 2013-05-14 ENCOUNTER — Other Ambulatory Visit: Payer: Medicaid Other

## 2013-05-18 ENCOUNTER — Ambulatory Visit
Admission: RE | Admit: 2013-05-18 | Discharge: 2013-05-18 | Disposition: A | Discharge status: Home / Self Care | Payer: Medicaid Other | Source: Ambulatory Visit | Attending: Family Medicine | Admitting: Family Medicine

## 2013-05-18 DIAGNOSIS — Z1321 Encounter for screening for nutritional disorder: Secondary | ICD-10-CM

## 2013-05-23 ENCOUNTER — Ambulatory Visit: Payer: Medicaid Other | Attending: Orthopedic Surgery | Admitting: Occupational Therapy

## 2013-05-23 DIAGNOSIS — Z5189 Encounter for other specified aftercare: Secondary | ICD-10-CM | POA: Diagnosis present

## 2013-05-23 DIAGNOSIS — Z13228 Encounter for screening for other metabolic disorders: Principal | ICD-10-CM

## 2013-05-23 DIAGNOSIS — Z130 Encounter for screening for diseases of the blood and blood-forming organs and certain disorders involving the immune mechanism: Secondary | ICD-10-CM | POA: Insufficient documentation

## 2013-05-23 DIAGNOSIS — Z1329 Encounter for screening for other suspected endocrine disorder: Principal | ICD-10-CM

## 2013-05-25 ENCOUNTER — Ambulatory Visit: Payer: Medicaid Other | Admitting: Occupational Therapy

## 2013-06-07 ENCOUNTER — Ambulatory Visit: Payer: Medicaid Other | Admitting: *Deleted

## 2013-06-07 DIAGNOSIS — Z130 Encounter for screening for diseases of the blood and blood-forming organs and certain disorders involving the immune mechanism: Secondary | ICD-10-CM | POA: Diagnosis not present

## 2013-06-14 ENCOUNTER — Ambulatory Visit: Payer: Medicaid Other | Admitting: *Deleted

## 2013-06-14 DIAGNOSIS — Z130 Encounter for screening for diseases of the blood and blood-forming organs and certain disorders involving the immune mechanism: Secondary | ICD-10-CM | POA: Diagnosis not present

## 2013-06-22 ENCOUNTER — Ambulatory Visit: Payer: Medicaid Other | Attending: Orthopedic Surgery | Admitting: *Deleted

## 2013-06-22 DIAGNOSIS — Z5189 Encounter for other specified aftercare: Secondary | ICD-10-CM | POA: Diagnosis present

## 2013-06-22 DIAGNOSIS — Z13228 Encounter for screening for other metabolic disorders: Principal | ICD-10-CM

## 2013-06-22 DIAGNOSIS — Z1329 Encounter for screening for other suspected endocrine disorder: Secondary | ICD-10-CM | POA: Diagnosis not present

## 2013-06-22 DIAGNOSIS — Z130 Encounter for screening for diseases of the blood and blood-forming organs and certain disorders involving the immune mechanism: Secondary | ICD-10-CM | POA: Diagnosis not present

## 2013-06-29 ENCOUNTER — Ambulatory Visit: Payer: Medicaid Other | Admitting: Occupational Therapy

## 2013-07-06 ENCOUNTER — Ambulatory Visit: Payer: Medicaid Other | Admitting: Occupational Therapy

## 2013-07-13 ENCOUNTER — Encounter: Payer: Medicaid Other | Admitting: Occupational Therapy

## 2013-08-22 ENCOUNTER — Other Ambulatory Visit: Payer: Self-pay | Admitting: Family Medicine

## 2013-08-22 DIAGNOSIS — Z1231 Encounter for screening mammogram for malignant neoplasm of breast: Secondary | ICD-10-CM

## 2013-09-17 ENCOUNTER — Ambulatory Visit (HOSPITAL_COMMUNITY)
Admission: RE | Admit: 2013-09-17 | Discharge: 2013-09-17 | Disposition: A | Discharge status: Home / Self Care | Payer: Medicaid Other | Source: Ambulatory Visit | Attending: Family Medicine | Admitting: Family Medicine

## 2013-09-17 DIAGNOSIS — Z1231 Encounter for screening mammogram for malignant neoplasm of breast: Secondary | ICD-10-CM | POA: Diagnosis not present

## 2013-11-30 ENCOUNTER — Other Ambulatory Visit: Payer: Self-pay

## 2014-01-09 ENCOUNTER — Telehealth: Payer: Self-pay | Admitting: Family Medicine

## 2014-01-09 NOTE — Telephone Encounter (Signed)
LM for patient to call back.  Please assist her in making an appt for dysuria.  Thanks [employer]

## 2014-01-09 NOTE — Telephone Encounter (Signed)
Given her history of interstital cystitis would need to be seen and have a ua and probable culture.  If we have openings offer other wise suggest UC  Thanks  [place]

## 2014-01-09 NOTE — Telephone Encounter (Signed)
Patient thinks she has an urinary infection and would like a  Lab test ASAP. Please follow up with Patient.

## 2014-01-14 ENCOUNTER — Ambulatory Visit: Payer: Medicaid Other | Admitting: *Deleted

## 2014-01-14 ENCOUNTER — Ambulatory Visit (INDEPENDENT_AMBULATORY_CARE_PROVIDER_SITE_OTHER): Payer: Medicaid Other | Admitting: *Deleted

## 2014-01-14 DIAGNOSIS — Z23 Encounter for immunization: Secondary | ICD-10-CM

## 2014-04-09 ENCOUNTER — Telehealth: Payer: Self-pay | Admitting: Family Medicine

## 2014-04-09 NOTE — Telephone Encounter (Signed)
[place] [facility] for Urology/ Ob-Gyn Gave NPI number and 3 visits

## 2014-05-22 ENCOUNTER — Telehealth: Payer: Self-pay | Admitting: Family Medicine

## 2014-05-22 ENCOUNTER — Encounter: Payer: Medicaid Other | Admitting: Family Medicine

## 2014-05-22 NOTE — Telephone Encounter (Signed)
Will forward to PCP for advice. Denise Aguirre, CMA. 

## 2014-05-22 NOTE — Telephone Encounter (Signed)
Needs to know how much calcium she needs to be taking / Fonda Kinder, ASA

## 2014-05-27 NOTE — Telephone Encounter (Signed)
Patient advised as directed below and verbalized understanding. Ewa Hipp, CMA. 

## 2014-05-27 NOTE — Telephone Encounter (Signed)
Pt is calling to check the status of her request from last week about how much calcium she shsould be taking. jw

## 2014-05-27 NOTE — Telephone Encounter (Signed)
Tell her Probably [phone] a day.  We can discuss at her next office visit  Thanks  [facility]

## 2014-05-27 NOTE — Telephone Encounter (Signed)
Dr. Erin Hearing pt is calling back wanting to know how much calcium she should be taking? Please advise. Jaishon Krisher, CMA.

## 2014-06-26 ENCOUNTER — Encounter: Payer: Self-pay | Admitting: Family Medicine

## 2014-06-26 ENCOUNTER — Ambulatory Visit (INDEPENDENT_AMBULATORY_CARE_PROVIDER_SITE_OTHER): Payer: Medicaid Other | Admitting: Family Medicine

## 2014-06-26 VITALS — BP 116/67 | HR 78 | Temp 97.6°F | Ht 65.0 in | Wt 160.0 lb

## 2014-06-26 DIAGNOSIS — Z1159 Encounter for screening for other viral diseases: Secondary | ICD-10-CM | POA: Diagnosis not present

## 2014-06-26 DIAGNOSIS — Z0000 Encounter for general adult medical examination without abnormal findings: Secondary | ICD-10-CM

## 2014-06-26 DIAGNOSIS — Z114 Encounter for screening for human immunodeficiency virus [HIV]: Secondary | ICD-10-CM | POA: Diagnosis not present

## 2014-06-26 DIAGNOSIS — Z13220 Encounter for screening for lipoid disorders: Secondary | ICD-10-CM

## 2014-06-26 DIAGNOSIS — E559 Vitamin D deficiency, unspecified: Secondary | ICD-10-CM | POA: Diagnosis not present

## 2014-06-26 DIAGNOSIS — R739 Hyperglycemia, unspecified: Secondary | ICD-10-CM | POA: Insufficient documentation

## 2014-06-26 LAB — POCT GLYCOSYLATED HEMOGLOBIN (HGB A1C): HEMOGLOBIN A1C: 5.9

## 2014-06-26 NOTE — Progress Notes (Signed)
   Subjective:    Patient ID: Denise Aguirre, female    DOB: September 12, 1960, 54 y.o.   MRN: [id number]  HPI  For annual exam  Feels well no complaints  History of vitamin D deficiency - taking calcium with vit d   History of gestational diabetes and had somewhat elevated blood sugar when fasting during recent hospital stay for hysterectomy  Patient reports no  vision/ hearing changes,anorexia, weight change, fever ,adenopathy, persistant / recurrent hoarseness, swallowing issues, chest pain, edema,persistant / recurrent cough, hemoptysis, dyspnea(rest, exertional, paroxysmal nocturnal), gastrointestinal  bleeding (melena, rectal bleeding), abdominal pain, excessive heart burn,   Issues) syncope, focal weakness, severe memory loss, concerning skin lesions, depression, anxiety, abnormal bruising/bleeding, major joint swelling, breast masses or abnormal vaginal bleeding.    Exercises regularly    Review of Systems     Objective:   Physical Exam  Alert no acute distress Neck:  No deformities, thyromegaly, masses, or tenderness noted.   Supple with full range of motion without pain. Heart - Regular rate and rhythm.  No murmurs, gallops or rubs.    Lungs:  Normal respiratory effort, chest expands symmetrically. Lungs are clear to auscultation, no crackles or wheezes. Abdomen: soft and non-tender without masses, organomegaly or hernias noted.  No guarding or rebound Extremities:  No cyanosis, edema, or deformity noted with good range of motion of all major joints.   Mouth - no lesions, mucous membranes are moist, no decaying teeth  Throat: normal mucosa, no exudate, uvula midline, no redness       Assessment & Plan:    Normal physical

## 2014-06-26 NOTE — Patient Instructions (Addendum)
Good to see you today!  Thanks for coming in.  Continue the exercise!  I will call you if your tests are not good.  Otherwise I will send you a message.  If you do not hear from me with in 2 weeks please call our office.     Continue at least 1000 mg of calcium per day in addition to green leafy veggies

## 2014-06-26 NOTE — Assessment & Plan Note (Signed)
With history of gestational diabetes check a1c

## 2014-06-26 NOTE — Assessment & Plan Note (Signed)
Does not sound like was every fully treated.  Check labs

## 2014-06-27 ENCOUNTER — Other Ambulatory Visit: Payer: Self-pay | Admitting: Family Medicine

## 2014-06-27 ENCOUNTER — Encounter: Payer: Self-pay | Admitting: Family Medicine

## 2014-06-27 LAB — LIPID PANEL
CHOLESTEROL: 239 mg/dL — AB (ref 0–200)
HDL: 64 mg/dL (ref >=46)
LDL Cholesterol: 156 mg/dL — ABNORMAL HIGH (ref 0–99)
TRIGLYCERIDES: 93 mg/dL (ref <150)
Total CHOL/HDL Ratio: 3.7 Ratio
VLDL: 19 mg/dL (ref 0–40)

## 2014-06-27 LAB — HIV ANTIBODY (ROUTINE TESTING W REFLEX): HIV 1&2 Ab, 4th Generation: NONREACTIVE

## 2014-06-27 LAB — HEPATITIS C ANTIBODY: HCV AB: NEGATIVE

## 2014-06-27 LAB — VITAMIN D 25 HYDROXY (VIT D DEFICIENCY, FRACTURES): VIT D 25 HYDROXY: 26 ng/mL — AB (ref 30–100)

## 2014-06-27 MED ORDER — VITAMIN D 1000 UNITS PO TABS: 1000.0000 [IU] | ORAL_TABLET | Freq: Every day | ORAL | Status: DC

## 2014-08-15 ENCOUNTER — Other Ambulatory Visit: Payer: Self-pay

## 2014-08-15 DIAGNOSIS — Z1231 Encounter for screening mammogram for malignant neoplasm of breast: Secondary | ICD-10-CM

## 2014-08-23 ENCOUNTER — Other Ambulatory Visit: Payer: Self-pay | Admitting: Family Medicine

## 2014-08-23 DIAGNOSIS — Z1231 Encounter for screening mammogram for malignant neoplasm of breast: Secondary | ICD-10-CM

## 2014-08-26 ENCOUNTER — Ambulatory Visit: Payer: Medicaid Other

## 2014-08-26 ENCOUNTER — Ambulatory Visit (HOSPITAL_COMMUNITY): Payer: Medicaid Other

## 2014-09-20 ENCOUNTER — Ambulatory Visit (HOSPITAL_COMMUNITY)
Admission: RE | Admit: 2014-09-20 | Discharge: 2014-09-20 | Disposition: A | Discharge status: Home / Self Care | Payer: Medicaid Other | Source: Ambulatory Visit | Attending: Family Medicine | Admitting: Family Medicine

## 2014-09-20 DIAGNOSIS — Z1231 Encounter for screening mammogram for malignant neoplasm of breast: Secondary | ICD-10-CM | POA: Diagnosis present

## 2014-09-26 ENCOUNTER — Ambulatory Visit: Payer: Medicaid Other

## 2014-11-05 DIAGNOSIS — G894 Chronic pain syndrome: Secondary | ICD-10-CM | POA: Insufficient documentation

## 2014-11-13 DIAGNOSIS — K5901 Slow transit constipation: Secondary | ICD-10-CM | POA: Insufficient documentation

## 2015-01-24 ENCOUNTER — Ambulatory Visit (INDEPENDENT_AMBULATORY_CARE_PROVIDER_SITE_OTHER): Payer: Medicaid Other

## 2015-01-24 DIAGNOSIS — Z23 Encounter for immunization: Secondary | ICD-10-CM | POA: Diagnosis present

## 2015-02-16 HISTORY — PX: COLONOSCOPY: SHX174

## 2015-03-10 ENCOUNTER — Encounter: Payer: Self-pay | Admitting: Gastroenterology

## 2015-03-24 ENCOUNTER — Encounter: Payer: Self-pay | Admitting: Gastroenterology

## 2015-05-09 ENCOUNTER — Ambulatory Visit (HOSPITAL_COMMUNITY): Payer: Medicaid Other | Attending: Family Medicine

## 2015-05-09 ENCOUNTER — Encounter: Payer: Self-pay | Admitting: Family Medicine

## 2015-05-09 ENCOUNTER — Ambulatory Visit (INDEPENDENT_AMBULATORY_CARE_PROVIDER_SITE_OTHER): Payer: Medicaid Other | Admitting: Family Medicine

## 2015-05-09 VITALS — BP 116/74 | HR 83 | Temp 98.4°F | Wt 159.0 lb

## 2015-05-09 DIAGNOSIS — M542 Cervicalgia: Secondary | ICD-10-CM | POA: Diagnosis not present

## 2015-05-09 DIAGNOSIS — R9431 Abnormal electrocardiogram [ECG] [EKG]: Secondary | ICD-10-CM | POA: Insufficient documentation

## 2015-05-09 DIAGNOSIS — R0789 Other chest pain: Secondary | ICD-10-CM | POA: Insufficient documentation

## 2015-05-09 DIAGNOSIS — R200 Anesthesia of skin: Secondary | ICD-10-CM

## 2015-05-09 DIAGNOSIS — R202 Paresthesia of skin: Secondary | ICD-10-CM

## 2015-05-09 DIAGNOSIS — I4510 Unspecified right bundle-branch block: Secondary | ICD-10-CM | POA: Diagnosis not present

## 2015-05-09 NOTE — Progress Notes (Signed)
    Subjective   Denise Aguirre is a 55 y.o. female that presents for a same day visit  1. Neck and left leg tingling: Symptoms started about two weeks ago. Symptoms initially started in her left neck area without radiation to her arm. She then started to have numbness/tingling down to her left leg. She reports development of chest pressure yesterday afternoon while in the kitchen preparing a meal. Chest pressure lasted about one hour. No associated dyspnea or palpitations but did have nausea. Tingling sensation never resolves, is worsened with moving, and improved sometimes with sitting. Nerve block for IC three weeks ago at [facility] (Dr. Theresa Mulligan). She has taken ibuprofen which has not helped much.  ROS Per HPI  Social History  Substance Use Topics  . Smoking status: Former Smoker    Quit date: 11/22/1985  . Smokeless tobacco: Never Used  . Alcohol Use: No    Allergies  Allergen Reactions  . Ivp Dye [Iodinated Diagnostic Agents]      Hives. itching    Objective   BP 116/74 mmHg  Pulse 83  Temp(Src) 98.4 F (36.9 C) (Oral)  Wt 159 lb (72.122 kg)  LMP 09/06/2013  General: Well appearing, no distress Cardiovascular: Regular rate and rhythm. Normal S1 and S2. No heart murmurs present. No extra heart sounds Musculoskeletal: Tenderness over left trapezius muscle. Normal ROM of neck. No cervical bony tenderness. Bilateral shoulders without weakness and normal sensation. Bilateral arms 5/5 in strength with normal sensation. Grip strength 5/5 bilaterally. No lumbar tenderness. Bilateral hips with equal range of motion, 5/5 strength and no tenderness. 5/5 thigh strength bilaterally with no tenderness. 5/5 leg strength bilaterally without sensation deficits and full flexion/extension. Straight leg negative. Neuro: Alert, oriented, CN intact. Biceps, triceps, brachioradialis, patellar and achilles reflexes equal bilaterally  Assessment and Plan   1. Numbness  and tingling of left leg Unsure of etiology. Possibly related to recent nerve block. Discussed following symptoms. If no improvement, will likely need MRI of back.  Doubt this is TIA/CVA.    2. Chest pressure Atypical. Heart score of 2 (history of hypercholesterolemia and Age). EKG appears unremarkable for ACS picture. Patient states she has never had a stress test. Will refer to cardiology for likely stress test since patient is very worried about heart disease although low risk. - EKG 12-Lead - Ambulatory referral to Cardiology  3. Neck pain Appears muscular. No red flags. Tylenol PRN. Follow-up if no improvement.

## 2015-05-09 NOTE — Patient Instructions (Signed)
Thank you for coming to see me today. It was a pleasure. Today we talked about:   Neck pain: This is likely muscular. For now, please use Tylenol 500mg -1000mg  every 8 hours as needed. Try to keep good posture when sitting down. Also, when using a cell phone, try to keep it held up so you are not flexing your neck.  Leg tingling: this may or may not be related to your recent nerve block. Your exam is not consistent with something bad like a stroke. If symptoms do not improve, you may need to get an MRI for follow-up.  Chest pressure: your EKG was not suggestive of a heart attack. I will refer you to the cardiologist for a stress test. We discussed your cholesterol and the likely need for you to start on cholesterol lowering medications  Please make an appointment to see Dr. Erin Hearing in May for a physical.  If you have any questions or concerns, please do not hesitate to call the office at [phone].  Sincerely,  Cordelia Poche, MD

## 2015-05-12 ENCOUNTER — Telehealth: Payer: Self-pay | Admitting: Cardiovascular Disease

## 2015-05-13 ENCOUNTER — Ambulatory Visit (AMBULATORY_SURGERY_CENTER): Payer: Self-pay

## 2015-05-13 VITALS — Ht 65.0 in | Wt 160.0 lb

## 2015-05-13 DIAGNOSIS — Z860100 Personal history of colon polyps, unspecified: Secondary | ICD-10-CM

## 2015-05-13 MED ORDER — SUPREP BOWEL PREP KIT 17.5-3.13-1.6 GM/177ML PO SOLN: 1.0000 | Freq: Once | ORAL | Status: DC

## 2015-05-13 NOTE — Progress Notes (Signed)
No allergies to eggs or soy No home oxygen No past problems with anesthesia EXCEPT PONV with general No diet/weight loss meds  Has email and internet; registered for emmi

## 2015-05-15 ENCOUNTER — Telehealth: Payer: Self-pay | Admitting: Gastroenterology

## 2015-05-15 NOTE — Telephone Encounter (Signed)
Informed patient that we are supposed to get the Suprep samples in today or tomorrow but if we do not then I will call her tomorrow and let her know. Pt verbalized understanding.

## 2015-05-16 NOTE — Telephone Encounter (Signed)
Magdalene River, CMA has left a message with patient to come by and pick up free sample of Suprep.

## 2015-05-20 ENCOUNTER — Encounter: Payer: Self-pay | Admitting: Gastroenterology

## 2015-05-20 ENCOUNTER — Ambulatory Visit (AMBULATORY_SURGERY_CENTER): Payer: Medicaid Other | Admitting: Gastroenterology

## 2015-05-20 VITALS — BP 133/74 | HR 73 | Temp 98.4°F | Resp 13 | Ht 65.0 in | Wt 160.0 lb

## 2015-05-20 DIAGNOSIS — D123 Benign neoplasm of transverse colon: Secondary | ICD-10-CM | POA: Diagnosis not present

## 2015-05-20 DIAGNOSIS — Z86010 Personal history of colonic polyps: Secondary | ICD-10-CM

## 2015-05-20 MED ORDER — SODIUM CHLORIDE 0.9 % IV SOLN: 500.0000 mL | INTRAVENOUS | Status: DC

## 2015-05-20 NOTE — Progress Notes (Signed)
Called to room to assist during endoscopic procedure.  Patient ID and intended procedure confirmed with present staff. Received instructions for my participation in the procedure from the performing physician.  

## 2015-05-20 NOTE — Op Note (Signed)
[facility] Patient Name: Denise Aguirre Procedure Date: 05/20/2015 11:34 AM MRN: [id number] Endoscopist: Ladene Artist , MD Age: 55 Referring MD:  Date of Birth: 15-Aug-1960 Gender: Female Procedure:                Colonoscopy Indications:              Surveillance: Personal history of adenomatous                            polyps on last colonoscopy > 3 years ago Medicines:                Monitored Anesthesia Care Procedure:                Pre-Anesthesia Assessment:                           - Prior to the procedure, a History and Physical                            was performed, and patient medications and                            allergies were reviewed. The patient's tolerance of                            previous anesthesia was also reviewed. The risks                            and benefits of the procedure and the sedation                            options and risks were discussed with the patient.                            All questions were answered, and informed consent                            was obtained. Prior Anticoagulants: The patient has                            taken no previous anticoagulant or antiplatelet                            agents. ASA Grade Assessment: II - A patient with                            mild systemic disease. After reviewing the risks                            and benefits, the patient was deemed in                            satisfactory condition to undergo the procedure.  After obtaining informed consent, the colonoscope                            was passed under direct vision. Throughout the                            procedure, the patient's blood pressure, pulse, and                            oxygen saturations were monitored continuously. The                            Model PCF-H190L [id number]) scope was introduced                            through the anus and advanced to the  the cecum,                            identified by appendiceal orifice and ileocecal                            valve. The colonoscopy was performed without                            difficulty. The patient tolerated the procedure                            well. The quality of the bowel preparation was                            adequate after extensive rinsing and suctioning.                            The ileocecal valve, appendiceal orifice, and                            rectum were photographed. Scope In: 11:42:31 AM Scope Out: 11:58:15 AM Scope Withdrawal Time: 0 hours 12 minutes 36 seconds  Total Procedure Duration: 0 hours 15 minutes 44 seconds  Findings:      The digital rectal exam was normal.      A 7 mm polyp was found in the transverse colon. The polyp was sessile.       The polyp was removed with a cold snare. Resection and retrieval were       complete.      The exam was otherwise normal throughout the examined colon.      The retroflexed view of the distal rectum and anal verge was normal and       showed no anal or rectal abnormalities. Complications:            No immediate complications. Estimated Blood Loss:     Estimated blood loss: none. Impression:               - One 7 mm polyp in the transverse colon, removed  with a cold snare. Resected and retrieved. Recommendation:           - Patient has a contact number available for                            emergencies. The signs and symptoms of potential                            delayed complications were discussed with the                            patient. Return to normal activities tomorrow.                            Written discharge instructions were provided to the                            patient.                           - Resume previous diet.                           - Continue present medications.                           - Await pathology results.                            - Repeat colonoscopy in 5 years for surveillance                            with a more extensive bowel prep. Ladene Artist, MD 05/20/2015 12:03:57 PM This report has been signed electronically. Number of Addenda: 0 Referring MD:

## 2015-05-20 NOTE — Patient Instructions (Signed)
YOU HAD AN ENDOSCOPIC PROCEDURE TODAY AT THE [facility]:   Refer to the procedure report that was given to you for any specific questions about what was found during the examination.  If the procedure report does not answer your questions, please call your gastroenterologist to clarify.  If you requested that your care partner not be given the details of your procedure findings, then the procedure report has been included in a sealed envelope for you to review at your convenience later.  YOU SHOULD EXPECT: Some feelings of bloating in the abdomen. Passage of more gas than usual.  Walking can help get rid of the air that was put into your GI tract during the procedure and reduce the bloating. If you had a lower endoscopy (such as a colonoscopy or flexible sigmoidoscopy) you may notice spotting of blood in your stool or on the toilet paper. If you underwent a bowel prep for your procedure, you may not have a normal bowel movement for a few days.  Please Note:  You might notice some irritation and congestion in your nose or some drainage.  This is from the oxygen used during your procedure.  There is no need for concern and it should clear up in a day or so.  SYMPTOMS TO REPORT IMMEDIATELY:   Following lower endoscopy (colonoscopy or flexible sigmoidoscopy):  Excessive amounts of blood in the stool  Significant tenderness or worsening of abdominal pains  Swelling of the abdomen that is new, acute  Fever of 100F or higher   For urgent or emergent issues, a gastroenterologist can be reached at any hour by calling [phone].   DIET: Your first meal following the procedure should be a small meal and then it is ok to progress to your normal diet. Heavy or fried foods are harder to digest and may make you feel nauseous or bloated.  Likewise, meals heavy in dairy and vegetables can increase bloating.  Drink plenty of fluids but you should avoid alcoholic beverages for 24  hours.  ACTIVITY:  You should plan to take it easy for the rest of today and you should NOT DRIVE or use heavy machinery until tomorrow (because of the sedation medicines used during the test).    FOLLOW UP: Our staff will call the number listed on your records the next business day following your procedure to check on you and address any questions or concerns that you may have regarding the information given to you following your procedure. If we do not reach you, we will leave a message.  However, if you are feeling well and you are not experiencing any problems, there is no need to return our call.  We will assume that you have returned to your regular daily activities without incident.  If any biopsies were taken you will be contacted by phone or by letter within the next 1-3 weeks.  Please call us at [phone] if you have not heard about the biopsies in 3 weeks.    SIGNATURES/CONFIDENTIALITY: You and/or your care partner have signed paperwork which will be entered into your electronic medical record.  These signatures attest to the fact that that the information above on your After Visit Summary has been reviewed and is understood.  Full responsibility of the confidentiality of this discharge information lies with you and/or your care-partner. 

## 2015-05-20 NOTE — Progress Notes (Signed)
Report to PACU, RN, vss, BBS= Clear.  

## 2015-05-21 ENCOUNTER — Telehealth: Payer: Self-pay | Admitting: *Deleted

## 2015-05-21 NOTE — Telephone Encounter (Signed)
  Follow up Call-  Call back number 05/20/2015  Post procedure Call Back phone  # [phone]  Permission to leave phone message Yes     No answer, left message.

## 2015-05-26 ENCOUNTER — Encounter: Payer: Self-pay | Admitting: Gastroenterology

## 2015-05-29 ENCOUNTER — Encounter: Payer: Self-pay | Admitting: Gastroenterology

## 2015-05-29 ENCOUNTER — Telehealth: Payer: Self-pay | Admitting: Gastroenterology

## 2015-05-29 NOTE — Telephone Encounter (Signed)
We spoke at length regarding the pathology and the pt will call back with any further questions.  She is aware that the polyps were not cancerous but were pre cancerous.

## 2015-06-11 ENCOUNTER — Telehealth: Payer: Self-pay | Admitting: Family Medicine

## 2015-06-11 DIAGNOSIS — R202 Paresthesia of skin: Secondary | ICD-10-CM

## 2015-06-11 NOTE — Telephone Encounter (Signed)
Pt was seen by Dr. Lonny Prude in 05/09/15. Was told that if pain did not resolve, that a MRI could be ordered. Pt would like to get the MRI. Please advise.

## 2015-06-13 NOTE — Telephone Encounter (Signed)
Had discussed this with Dr. Nori Riis at the time of appointment and she recommended MRI if no improvement. Will place order for MRI. Please help patient schedule. Thanks!

## 2015-06-17 NOTE — Telephone Encounter (Signed)
LVM for pt to call. Need to ask her what days and time would be best to schedule the MRI. Ottis Stain, CMA

## 2015-06-18 NOTE — Telephone Encounter (Signed)
Pt has been scheduled for 06/25/15 @ 4:00 PM at [facility] with this info.

## 2015-06-19 ENCOUNTER — Ambulatory Visit (INDEPENDENT_AMBULATORY_CARE_PROVIDER_SITE_OTHER): Payer: Medicaid Other | Admitting: Cardiovascular Disease

## 2015-06-19 ENCOUNTER — Encounter: Payer: Self-pay | Admitting: Cardiovascular Disease

## 2015-06-19 VITALS — BP 132/64 | HR 83 | Ht 65.0 in | Wt 159.4 lb

## 2015-06-19 DIAGNOSIS — Z7689 Persons encountering health services in other specified circumstances: Secondary | ICD-10-CM

## 2015-06-19 DIAGNOSIS — Z7189 Other specified counseling: Secondary | ICD-10-CM | POA: Diagnosis not present

## 2015-06-19 DIAGNOSIS — M47816 Spondylosis without myelopathy or radiculopathy, lumbar region: Secondary | ICD-10-CM | POA: Insufficient documentation

## 2015-06-19 NOTE — Patient Instructions (Addendum)
Medication Instructions:  Your physician recommends that you continue on your current medications as directed. Please refer to the Current Medication list given to you today.  Labwork: NONE  Testing/Procedures: NONE  Follow-Up: Your physician wants you to follow-up in: 6 months with Dr. Johnsie Cancel. You will receive a reminder letter in the mail two months in advance. If you don't receive a letter, please call our office to schedule the follow-up appointment.  Please call [phone] and ask for Howie Ill to make an appointment for a Ca Score.   If you need a refill on your cardiac medications before your next appointment, please call your pharmacy.

## 2015-06-19 NOTE — Progress Notes (Signed)
Patient ID: Denise Aguirre, female   DOB: Jun 25, 1960, 55 y.o.   MRN: [id number]     Cardiology Office Note   Date:  06/19/2015   ID:  Denise Aguirre, DOB 27-Jan-1961, MRN [id number]  PCP:  Lind Covert, MD  Cardiologist:   Jenkins Rouge, MD   Chief Complaint  Patient presents with  . Establish Care      History of Present Illness: Denise Aguirre is a 55 y.o. female who presents for evaluation of atypical chest pain.  Seen by primary 05/09/15  Symptoms started mid March . Symptoms initially started in her left neck area without radiation to her arm. She then started to have numbness/tingling down to her left leg. She reports development of chest pressure 3/25  afternoon while in the kitchen preparing a meal. Chest pressure lasted about one hour. No associated dyspnea or palpitations but did have nausea. Tingling sensation never resolves, is worsened with moving, and improved sometimes with sitting.  Nerve block for interstitial cystitis  beginning of March  at [facility] (Dr. Theresa Mulligan). She has taken ibuprofen which has not helped much. Has not had recurrence of pain. Exercises daily with no issues.  Older sister had afib no premature CAD    Past Medical History  Diagnosis Date  . Cystitis, interstitial     chronic since 1999  . Diverticulitis   . Other malaise and fatigue   . Screening cholesterol level     Past Surgical History  Procedure Laterality Date  . Tubal ligation      1995  . Tonsillectomy      1980  . Interstim implant placement      and removal in 2008     Current Outpatient Prescriptions  Medication Sig Dispense Refill  . acetaminophen (TYLENOL) 325 MG tablet Take 650 mg by mouth every 6 (six) hours as needed.    . Calcium Carb-Cholecalciferol 600-200 MG-UNIT TABS Take 2 tablets by mouth daily.    . cholecalciferol (VITAMIN D) 1000 UNITS tablet Take 1 tablet (1,000 Units total) by mouth daily. 30 tablet 3  . hydrOXYzine  (ATARAX/VISTARIL) 25 MG tablet Take 25 mg by mouth at bedtime.    . methadone (DOLOPHINE) 5 MG tablet Take 5 mg by mouth every 12 (twelve) hours.    . Omega-3 Fatty Acids (FISH OIL PO) Take by mouth.     No current facility-administered medications for this visit.    Allergies:   Ivp dye    Social History:  The patient  reports that she quit smoking about 29 years ago. She has never used smokeless tobacco. She reports that she does not drink alcohol or use illicit drugs.   Family History:  The patient's family history includes Breast cancer in her maternal grandmother; Stroke in her father. There is no history of Colon cancer.    ROS:  Please see the history of present illness.   Otherwise, review of systems are positive for none.   All other systems are reviewed and negative.    PHYSICAL EXAM: VS:  LMP 09/06/2013 , BMI There is no weight on file to calculate BMI. Affect appropriate Healthy:  appears stated age 66: normal Neck supple with no adenopathy JVP normal no bruits no thyromegaly Lungs clear with no wheezing and good diaphragmatic motion Heart:  S1/S2 no murmur, no rub, gallop or click PMI normal Abdomen: benighn, BS positve, no tenderness, no AAA no bruit.  No HSM or HJR Distal pulses intact with  no bruits No edema Neuro non-focal Skin warm and dry No muscular weakness    EKG:  05/09/15  SR rate 72 ICRBBB   Recent Labs: No results found for requested labs within last 365 days.    Lipid Panel    Component Value Date/Time   CHOL 239* 06/26/2014 1207   TRIG 93 06/26/2014 1207   HDL 64 06/26/2014 1207   CHOLHDL 3.7 06/26/2014 1207   VLDL 19 06/26/2014 1207   LDLCALC 156* 06/26/2014 1207      Wt Readings from Last 3 Encounters:  05/20/15 72.576 kg (160 lb)  05/13/15 72.576 kg (160 lb)  05/09/15 72.122 kg (159 lb)      Other studies Reviewed: Additional studies/ records that were reviewed today include: Old records primary care notes  .    ASSESSMENT AND PLAN:  1. Chest Pain: atypical resolved normal ECG  Discussed options of observing, ETT and calcium score to further risk stratify.  Favor latter She will let us know if she wants to schedule and call if she gets further pain 2. Chol: labs repeating by primary next month calcium score would help decide if anything but diet and lifestyle changes needed 3. Leg Pain  Related to sciatica and injections f/u primary    Current medicines are reviewed at length with the patient today.  The patient does not have concerns regarding medicines.  The following changes have been made:  no change  Labs/ tests ordered today include: calcium score   No orders of the defined types were placed in this encounter.     Disposition:   FU with me in a year      Signed, Jenkins Rouge, MD  06/19/2015 9:30 AM    [facility] [address], [place], [place]  [address] Phone: [phone]; Fax: [phone]

## 2015-06-25 ENCOUNTER — Ambulatory Visit (HOSPITAL_COMMUNITY): Admission: RE | Admit: 2015-06-25 | Payer: Medicaid Other | Source: Ambulatory Visit

## 2015-07-02 ENCOUNTER — Ambulatory Visit (INDEPENDENT_AMBULATORY_CARE_PROVIDER_SITE_OTHER): Payer: Medicaid Other | Admitting: Family Medicine

## 2015-07-02 ENCOUNTER — Encounter: Payer: Self-pay | Admitting: Family Medicine

## 2015-07-02 DIAGNOSIS — Z0000 Encounter for general adult medical examination without abnormal findings: Secondary | ICD-10-CM

## 2015-07-02 DIAGNOSIS — R739 Hyperglycemia, unspecified: Secondary | ICD-10-CM

## 2015-07-02 DIAGNOSIS — Z13220 Encounter for screening for lipoid disorders: Secondary | ICD-10-CM

## 2015-07-02 LAB — LIPID PANEL
CHOLESTEROL: 213 mg/dL — AB (ref 125–200)
HDL: 59 mg/dL (ref >=46)
LDL Cholesterol: 136 mg/dL — ABNORMAL HIGH (ref <130)
Total CHOL/HDL Ratio: 3.6 Ratio (ref <=5.0)
Triglycerides: 92 mg/dL (ref <150)
VLDL: 18 mg/dL (ref <30)

## 2015-07-02 LAB — POCT GLYCOSYLATED HEMOGLOBIN (HGB A1C): HEMOGLOBIN A1C: 5.8

## 2015-07-02 NOTE — Patient Instructions (Signed)
Good to see you today!  Thanks for coming in.  If your neck pain in not better in 2-3 week or if not gone in a month the call me  I will call you if your tests are not good.  Otherwise I will send you a letter.  If you do not hear from me with in 2 weeks please call our office.     If you have chest pain with exertion then call us or come back

## 2015-07-02 NOTE — Progress Notes (Signed)
   Subjective:    Patient ID: Denise Aguirre, female    DOB: 22-Nov-1960, 55 y.o.   MRN: [id number]  HPI  Here for exam.    Specific Issues Neck pain stiffness - for last month or so.  Feels out of place.  No numbenss or tingling or weakness or incontinecne.  No specific injury.  No leg tingling now.  Has MRI scheduled tomorrow   Chest Pain - had once.  None since.  None with exertion.  Saw Dr Johnsie Cancel  Patient reports no  vision/ hearing changes,anorexia, weight change, fever ,adenopathy, persistant / recurrent hoarseness, swallowing issues,  edema,persistant / recurrent cough, hemoptysis, dyspnea(rest, exertional, paroxysmal nocturnal), gastrointestinal  bleeding (melena, rectal bleeding), abdominal pain, excessive heart burn, GU symptoms(dysuria, hematuria, pyuria, voiding/incontinence  Issues) syncope, focal weakness, severe memory loss, concerning skin lesions, depression, anxiety, abnormal bruising/bleeding, major joint swelling, breast masses or abnormal vaginal bleeding.      Review of Systems     Objective:   Physical Exam  Alert healthy appearing Heart - Regular rate and rhythm.  No murmurs, gallops or rubs.    Lungs:  Normal respiratory effort, chest expands symmetrically. Lungs are clear to auscultation, no crackles or wheezes.Marland Kitchenabd Neck:  No deformities, thyromegaly, masses, or tenderness noted.   Supple with full range of motion without pain but does feel stiffness Skin:  Intact without suspicious lesions or rashes Ears:  External ear exam shows no significant lesions or deformities.  Otoscopic examination reveals clear canals, tympanic membranes are intact bilaterally without bulging, retraction, inflammation or discharge. Hearing is grossly normal bilaterall      Assessment & Plan:    Neck Pain - no red flags for cancer or impingement.  Told could wait at least 6-8 weeks before needing imaging   Chest Pain - sounds noncardiac   HM - normal exam Will check labs

## 2015-07-03 ENCOUNTER — Ambulatory Visit (HOSPITAL_COMMUNITY): Admission: RE | Admit: 2015-07-03 | Payer: Medicaid Other | Source: Ambulatory Visit

## 2015-07-03 ENCOUNTER — Encounter: Payer: Self-pay | Admitting: Family Medicine

## 2015-07-24 ENCOUNTER — Ambulatory Visit (HOSPITAL_COMMUNITY)
Admission: RE | Admit: 2015-07-24 | Discharge: 2015-07-24 | Disposition: A | Discharge status: Home / Self Care | Payer: Medicaid Other | Source: Ambulatory Visit | Attending: Family Medicine | Admitting: Family Medicine

## 2015-07-24 DIAGNOSIS — R202 Paresthesia of skin: Secondary | ICD-10-CM

## 2015-07-25 ENCOUNTER — Telehealth: Payer: Self-pay | Admitting: Family Medicine

## 2015-07-25 DIAGNOSIS — M542 Cervicalgia: Secondary | ICD-10-CM | POA: Insufficient documentation

## 2015-07-25 NOTE — Telephone Encounter (Signed)
Pt called because she would like to speak to the doctor about orders for her imaging, jw

## 2015-07-25 NOTE — Telephone Encounter (Signed)
Called at [phone] and at 535   No radicular symptoms other than some pain in the neck shoulder area on L No weakness or loss of sensation Recommend Take ibuprofen and tylenol q 8 hrs for 3 days Get xray of neck  I will contact once is done  She agrrees

## 2015-07-29 ENCOUNTER — Ambulatory Visit
Admission: RE | Admit: 2015-07-29 | Discharge: 2015-07-29 | Disposition: A | Discharge status: Home / Self Care | Payer: Medicaid Other | Source: Ambulatory Visit | Attending: Family Medicine | Admitting: Family Medicine

## 2015-07-29 DIAGNOSIS — M542 Cervicalgia: Secondary | ICD-10-CM

## 2015-08-14 ENCOUNTER — Ambulatory Visit (INDEPENDENT_AMBULATORY_CARE_PROVIDER_SITE_OTHER): Payer: Medicaid Other | Admitting: Family Medicine

## 2015-08-14 ENCOUNTER — Encounter: Payer: Self-pay | Admitting: Family Medicine

## 2015-08-14 VITALS — BP 126/74 | HR 81 | Temp 98.3°F | Wt 158.0 lb

## 2015-08-14 DIAGNOSIS — R42 Dizziness and giddiness: Secondary | ICD-10-CM | POA: Insufficient documentation

## 2015-08-14 MED ORDER — MECLIZINE HCL 25 MG PO TABS: 25.0000 mg | ORAL_TABLET | Freq: Three times a day (TID) | ORAL | Status: DC | PRN

## 2015-08-14 NOTE — Progress Notes (Signed)
   Subjective:  Denise Aguirre is a 55 y.o. female who presents to the [facility] today with a chief complaint of vertigo.   HPI:  Vertigo Symptoms started a few days ago while she was brushing her hair and felt the room spinning around her. She has a few episodes since then. These episodes last for a few moments then resolve spontaneously. Symptoms always occur with motion of head. Symptoms doe not occur with rest. No syncopal episodes. No weakness or numbness. No ringing in her ears, though has noticed increased fullness in her right ear over the past few days. No hearing loss.  Has had some nausea associated with the vertigo. No vomiting. No headaches.   Had similar symptoms a few years ago that resolved spontaneously.  ROS: Per HPI  Objective:  Physical Exam: BP 126/74 mmHg  Pulse 81  Temp(Src) 98.3 F (36.8 C) (Oral)  Wt 158 lb (71.668 kg)  SpO2 99%  LMP 09/06/2013  Gen: NAD, resting comfortably HEENT: Tms clear bilaterally.  Neuro: EOMI, PERRL. CN2-12 intact. Strength 5/5 throughout. Sensation to light touch intact throughout. Dix Hallpike negative bilaterally.  Psych: Normal affect and thought content  Assessment/Plan:  Vertigo Symptoms likely secondary to BPPV given positional nature. Doubt Meniere's given lack of hearing loss and tinnitus. Doubt intracranial pathology given normal neurological exam. Gave handout on Epley Maneuvers. Will give prescription for meclizine. Return precautions reviewed. If symptoms not improving, consider vestibular rehab referral and/or advanced imaging if symptoms worsening or develops neurological findings.   Algis Greenhouse. Jerline Pain, [facility] Resident PGY-2 08/14/2015 3:52 PM

## 2015-08-14 NOTE — Assessment & Plan Note (Signed)
Symptoms likely secondary to BPPV given positional nature. Doubt Meniere's given lack of hearing loss and tinnitus. Doubt intracranial pathology given normal neurological exam. Gave handout on Epley Maneuvers. Will give prescription for meclizine. Return precautions reviewed. If symptoms not improving, consider vestibular rehab referral and/or advanced imaging if symptoms worsening or develops neurological findings.

## 2015-08-14 NOTE — Patient Instructions (Signed)
You likely have benign positional vertigo. We will give you a medication for this.  Please also try the exercises below.   If your symptoms are not improving over the next couple weeks, or worsening, please come back.   Take care,  Dr Jerline Pain  Epley Maneuver Self-Care WHAT IS THE EPLEY MANEUVER? The Epley maneuver is an exercise you can do to relieve symptoms of benign paroxysmal positional vertigo (BPPV). This condition is often just referred to as vertigo. BPPV is caused by the movement of tiny crystals (canaliths) inside your inner ear. The accumulation and movement of canaliths in your inner ear causes a sudden spinning sensation (vertigo) when you move your head to certain positions. Vertigo usually lasts about 30 seconds. BPPV usually occurs in just one ear. If you get vertigo when you lie on your left side, you probably have BPPV in your left ear. Your health care provider can tell you which ear is involved.  BPPV may be caused by a head injury. Many people older than 50 get BPPV for unknown reasons. If you have been diagnosed with BPPV, your health care provider may teach you how to do this maneuver. BPPV is not life threatening (benign) and usually goes away in time.  WHEN SHOULD I PERFORM THE EPLEY MANEUVER? You can do this maneuver at home whenever you have symptoms of vertigo. You may do the Epley maneuver up to 3 times a day until your symptoms of vertigo go away. HOW SHOULD I DO THE EPLEY MANEUVER? 1. Sit on the edge of a bed or table with your back straight. Your legs should be extended or hanging over the edge of the bed or table.  2. Turn your head halfway toward the affected ear.  3. Lie backward quickly with your head turned until you are lying flat on your back. You may want to position a pillow under your shoulders.  4. Hold this position for 30 seconds. You may experience an attack of vertigo. This is normal. Hold this position until the vertigo stops. 5. Then turn your  head to the opposite direction until your unaffected ear is facing the floor.  6. Hold this position for 30 seconds. You may experience an attack of vertigo. This is normal. Hold this position until the vertigo stops. 7. Now turn your whole body to the same side as your head. Hold for another 30 seconds.  8. You can then sit back up. ARE THERE RISKS TO THIS MANEUVER? In some cases, you may have other symptoms (such as changes in your vision, weakness, or numbness). If you have these symptoms, stop doing the maneuver and call your health care provider. Even if doing these maneuvers relieves your vertigo, you may still have dizziness. Dizziness is the sensation of light-headedness but without the sensation of movement. Even though the Epley maneuver may relieve your vertigo, it is possible that your symptoms will return within 5 years. WHAT SHOULD I DO AFTER THIS MANEUVER? After doing the Epley maneuver, you can return to your normal activities. Ask your doctor if there is anything you should do at home to prevent vertigo. This may include:  Sleeping with two or more pillows to keep your head elevated.  Not sleeping on the side of your affected ear.  Getting up slowly from bed.  Avoiding sudden movements during the day.  Avoiding extreme head movement, like looking up or bending over.  Wearing a cervical collar to prevent sudden head movements. WHAT SHOULD I DO  IF MY SYMPTOMS GET WORSE? Call your health care provider if your vertigo gets worse. Call your provider right way if you have other symptoms, including:   Nausea.  Vomiting.  Headache.  Weakness.  Numbness.  Vision changes.   This information is not intended to replace advice given to you by your health care provider. Make sure you discuss any questions you have with your health care provider.   Document Released: 02/06/2013 Document Reviewed: 02/06/2013 Elsevier Interactive Patient Education [employer].

## 2015-08-22 ENCOUNTER — Telehealth: Payer: Self-pay | Admitting: Family Medicine

## 2015-08-22 NOTE — Telephone Encounter (Signed)
Pt called because she was prescribed medication for her Vertigo. The medication takes very long to work and she will be flying on Monday and was told that there is a patch that can be used called trans-derm patch. She would like this called so that she can have this before the flight. Please call patient and let her know the status of this. jw

## 2015-08-22 NOTE — Telephone Encounter (Signed)
Patient calling again, would like a call back from someone today regarding whether this is going to be sent in or not.

## 2015-08-22 NOTE — Telephone Encounter (Signed)
Do not recommend scop patch for vertigo Asked to try the meclizine before flying Continue manuevers She agrees  She has no weakness or visual changes

## 2015-08-22 NOTE — Telephone Encounter (Signed)
Will forward to Dr. Jerline Pain to advise. Rowynn Mcweeney,CMA

## 2015-08-25 ENCOUNTER — Other Ambulatory Visit: Payer: Self-pay | Admitting: Family Medicine

## 2015-08-25 DIAGNOSIS — Z1231 Encounter for screening mammogram for malignant neoplasm of breast: Secondary | ICD-10-CM

## 2015-09-23 ENCOUNTER — Ambulatory Visit
Admission: RE | Admit: 2015-09-23 | Discharge: 2015-09-23 | Disposition: A | Discharge status: Home / Self Care | Payer: Medicaid Other | Source: Ambulatory Visit | Attending: Family Medicine | Admitting: Family Medicine

## 2015-09-23 ENCOUNTER — Ambulatory Visit: Payer: Medicaid Other

## 2015-09-23 DIAGNOSIS — Z1231 Encounter for screening mammogram for malignant neoplasm of breast: Secondary | ICD-10-CM

## 2015-11-25 ENCOUNTER — Ambulatory Visit: Payer: Medicaid Other | Admitting: Physical Therapy

## 2015-11-26 ENCOUNTER — Encounter: Payer: Self-pay | Admitting: Physical Therapy

## 2015-11-26 ENCOUNTER — Ambulatory Visit: Payer: Medicaid Other | Attending: Orthopedic Surgery | Admitting: Physical Therapy

## 2015-11-26 DIAGNOSIS — M25572 Pain in left ankle and joints of left foot: Secondary | ICD-10-CM

## 2015-11-26 DIAGNOSIS — M25672 Stiffness of left ankle, not elsewhere classified: Secondary | ICD-10-CM | POA: Diagnosis present

## 2015-11-26 DIAGNOSIS — M6281 Muscle weakness (generalized): Secondary | ICD-10-CM | POA: Diagnosis not present

## 2015-11-26 NOTE — Patient Instructions (Addendum)
Gastroc Stretch    Stand with right foot back, leg straight, forward leg bent. Keeping heel on floor, turned slightly out, lean into wall until stretch is felt in calf. Hold _30___ seconds. Repeat __2__ times per set. Do __1__ sets per session. Do __3__ sessions per day.  http://orth.exer.us/26   Copyright  VHI. All rights reserved.  Inversion: Resisted    Cross legs with right leg underneath, foot in tubing loop. Hold tubing around other foot to resist and turn foot in. Repeat _30___ times per set. Do _1___ sets per session. Do _1-2___ sessions per day. Do not work where there is pain. NO pain. Better to wear a sneaker http://orth.exer.us/12   Copyright  VHI. All rights reserved.  Eversion: Resisted    With right foot in tubing loop, hold tubing around other foot to resist and turn foot out. Repeat _30___ times per set. Do __1__ sets per session. Do __1-2__ sessions per day.  http://orth.exer.us/14   Copyright  VHI. All rights reserved.   Red band easier than greenHeel Raise: Unilateral (Standing)    Balance on left foot, then rise on ball of foot. Repeat __15__ times per set. Do _1___ sets per session. Do __2__ sessions per day. Start with 2 feet 30 times. When no pain then do the left.  Start 5 times work toward 30x http://orth.exer.us/40   Copyright  VHI. All rights reserved.   Ice Massage and Cold Packs Home Program  Ice and cold packs are used so that we can return the muscle to it's natural resting state without causing more pain, which can lead to more spasm, etc.  Icing and cold packs are also used to reduce swelling, which can lead to pain and stiffness.  COLD PACKS Cold packs should be placed circumferentially around the swollen area (ie., the wrist, etc.).  A paper towel can be placed over the area before the cold pack is applied.  A towel may be wrapped around the outside of the cold pack to keep the cold in.  The swollen area should be elevated with  the cold pack, if possible.  ICE MASSAGE Fill a 4 ounce paper cup three-quarters full and put it in a freezer until it is frozen.  When ready to use, tear off about 1 inch of the cup so that some of the ice is showing while the bottom of the cup can be used to hold onto. Massage the entire muscle area as instructed by your therapist.  You may use circular or up and down strokes, but do not hold the ice in one spot.  Four phases to the ice massage and cold pack application: 1. Cold: which you feel when you first apply the ice. 2. Ache: after a few minutes 3. Burning: after approximately five minutes, it will feel like your skin is burning.  At this point, remove the ice for a minute or so. 4. Numbness: THIS IS THE CRUCIAL PHASE!!! Return the ice or cold pack and massage until all the burning disappears.  This signals the end of cryotherapy.  The entire procedure should take ten to fifteen minutes while using the cold pack.  Do Not perform the ice massage for more than seven minutes on a small area or more than ten minutes on a large area.  Cryotherapy should be performed after exercises, when edema occurs, or when an area is painful. [facility] [address] [place], [place] [address] Phone # [phone] Fax [phone] [email or web address]

## 2015-11-26 NOTE — Therapy (Signed)
[facility] [address], [place], [place], [address] Phone: [phone]   Fax:  [phone]  Physical Therapy Evaluation  Patient Details  Name: ELIOT BERTOLUCCI MRN: [id number] Date of Birth: [birth year] Referring Provider: Dr. Victorino December  Encounter Date: 11/26/2015      PT End of Session - 11/26/15 1613    Visit Number 1   PT Start Time [id number]   PT Stop Time 1615   PT Time Calculation (min) 45 min   Activity Tolerance Patient tolerated treatment well   Behavior During Therapy [facility] for tasks assessed/performed      Past Medical History:  Diagnosis Date  . Cystitis, interstitial    chronic since 1999  . Diverticulitis   . Other malaise and fatigue   . Screening cholesterol level     Past Surgical History:  Procedure Laterality Date  . INTERSTIM IMPLANT PLACEMENT     and removal in 2008  . TONSILLECTOMY     1980  . TUBAL LIGATION     1995    There were no vitals filed for this visit.       Subjective Assessment - 11/26/15 1534    Subjective Patient reports she was walking with a small heel and her ankle rolled at end of July. Patient tried to rum on the treadmill and had pain. Patient tried to do you tube exercises for ankle strength.  When patient turns her ankle to the outside she has pain and feels like something is poking at it.    Diagnostic tests x-ray showed no fracture   Patient Stated Goals exercises   Currently in Pain? Yes   Pain Score 5    Pain Location Ankle   Pain Orientation Left   Pain Descriptors / Indicators Nagging   Pain Type Chronic pain   Pain Onset More than a month ago   Pain Frequency Intermittent   Aggravating Factors  exercise, running on treadmill   Pain Relieving Factors ice   Multiple Pain Sites No            [facility] PT Assessment - 11/26/15 0001      Assessment   Medical Diagnosis S93.492A Sprain of anterio talofibular ligament of left ankle, initial  encounter   Referring Provider Dr. Victorino December   Onset Date/Surgical Date 09/14/15   Prior Therapy None     Precautions   Precautions None     Restrictions   Weight Bearing Restrictions No     Balance Screen   Has the patient fallen in the past 6 months No   Has the patient had a decrease in activity level because of a fear of falling?  No   Is the patient reluctant to leave their home because of a fear of falling?  No     Home Ecologist residence     Prior Function   Level of Independence Independent     Cognition   Overall Cognitive Status Within Functional Limits for tasks assessed     ROM / Strength   AROM / PROM / Strength Strength;AROM;PROM     AROM   AROM Assessment Site Ankle   Right/Left Ankle Left   Left Ankle Dorsiflexion 15  knee extension ankle DF 5 degrees   Left Ankle Inversion 8   Left Ankle Eversion 10     PROM   PROM Assessment Site Ankle   Right/Left Ankle Left   Left Ankle Dorsiflexion 20  Left Ankle Inversion 15   Left Ankle Eversion 15     Strength   Strength Assessment Site Ankle   Right/Left Ankle Left     Palpation   Palpation comment tenderness located on inferior left lateral malleolus; decreased mobility of talus and cuuboid bones                           PT Education - 11/26/15 1612    Education provided Yes   Education Details stretches and theraband exercises   Person(s) Educated Patient   Methods Explanation;Demonstration;Verbal cues;Handout   Comprehension Returned demonstration;Verbalized understanding             PT Long Term Goals - 11/26/15 1619      PT LONG TERM GOAL #1   Title understrand strengthening and flexibility exercises that will improve her running and exericse   Time 1   Period Days   Status Achieved               Plan - 11/26/15 1614    Clinical Impression Statement Patient is a 55 year old female with diagnosis of sprain of left  ankle that happened on 09/14/2015 when she rolled her ankle walking on small heel. Patient reports an intermittent nagging pain in left ankle when she runs or exercises.  Patient has increased swelling of left ankle. Decreased mobility of talus and cuboid bones of left foot.  Thickness of the lateral Deltoid ligaments of left ankle. Patient has decreased left ankle dorsiflexion in closed chain position and decreased strength.  Patient has pain with plantarflexion on left foot. Patient is of lowe complex evaluation due to a stable condition and not comorbities to impact care provided.    Rehab Potential Excellent   Clinical Impairments Affecting Rehab Potential none   PT Frequency 1x / week   PT Duration --  1 time session with eval   PT Treatment/Interventions Neuromuscular re-education;Patient/family education;Therapeutic exercise;Balance training   PT Next Visit Plan Discharge this visit to HEP   PT Home Exercise Plan Current HEP   Recommended Other Services None   Consulted and Agree with Plan of Care Patient      Patient will benefit from skilled therapeutic intervention in order to improve the following deficits and impairments:  Decreased strength, Decreased mobility  Visit Diagnosis: Muscle weakness (generalized) - Plan: PT plan of care cert/re-cert  Pain in left ankle and joints of left foot - Plan: PT plan of care cert/re-cert  Stiffness of left ankle, not elsewhere classified - Plan: PT plan of care cert/re-cert     Problem List Patient Active Problem List   Diagnosis Date Noted  . Vertigo 08/14/2015  . Neck pain 07/25/2015  . Vitamin D deficiency 06/26/2014  . Hyperglycemia 06/26/2014  . Screening cholesterol level 12/13/2012  . CYSTITIS, CHRONIC 04/14/2006    Earlie Counts, PT 11/26/15 4:22 PM   [facility] [address], [place], [place], [address] Phone: [phone]   Fax:  [phone]  Name: LIRIDONA BULTEMEIER MRN: [id number] Date of Birth: 1960/07/29

## 2015-12-05 NOTE — Telephone Encounter (Signed)
Error

## 2015-12-08 ENCOUNTER — Telehealth: Payer: Self-pay | Admitting: Family Medicine

## 2015-12-08 NOTE — Telephone Encounter (Signed)
Would like to know how much Vitamin D and calicum she is suppose to be taking

## 2015-12-09 NOTE — Telephone Encounter (Signed)
Should be taking 1,000 units of Vit D per day and 1200 mg of calcium per day  Should come in for a vit d level in the next 6 months  Thanks  LC

## 2015-12-09 NOTE — Telephone Encounter (Signed)
Left message for pt to call me back to discuss her medication directions.

## 2015-12-10 NOTE — Telephone Encounter (Signed)
Pt informed of directions and to follow-up with lab work in 9mos.

## 2015-12-15 ENCOUNTER — Ambulatory Visit: Payer: Medicaid Other

## 2015-12-17 ENCOUNTER — Ambulatory Visit (INDEPENDENT_AMBULATORY_CARE_PROVIDER_SITE_OTHER): Payer: Medicaid Other | Admitting: *Deleted

## 2015-12-17 DIAGNOSIS — Z23 Encounter for immunization: Secondary | ICD-10-CM | POA: Diagnosis not present

## 2016-02-17 ENCOUNTER — Ambulatory Visit (HOSPITAL_COMMUNITY)
Admission: EM | Admit: 2016-02-17 | Discharge: 2016-02-17 | Disposition: A | Discharge status: Home / Self Care | Payer: Medicaid Other | Attending: Family Medicine | Admitting: Family Medicine

## 2016-02-17 ENCOUNTER — Encounter (HOSPITAL_COMMUNITY): Payer: Self-pay | Admitting: Emergency Medicine

## 2016-02-17 DIAGNOSIS — J069 Acute upper respiratory infection, unspecified: Secondary | ICD-10-CM

## 2016-02-17 MED ORDER — GUAIFENESIN-CODEINE 100-10 MG/5ML PO SYRP: 10.0000 mL | ORAL_SOLUTION | Freq: Four times a day (QID) | ORAL | 0 refills | Status: DC | PRN

## 2016-02-17 MED ORDER — IPRATROPIUM BROMIDE 0.06 % NA SOLN: 2.0000 | Freq: Four times a day (QID) | NASAL | 1 refills | Status: DC

## 2016-02-17 MED ORDER — CEFDINIR 300 MG PO CAPS: 300.0000 mg | ORAL_CAPSULE | Freq: Two times a day (BID) | ORAL | 0 refills | Status: DC

## 2016-02-17 NOTE — ED Provider Notes (Signed)
[facility]    CSN: [id number] Arrival date & time: 02/17/16  1144     History   Chief Complaint Chief Complaint  Patient presents with  . Sore Throat    HPI Denise Aguirre is a 56 y.o. female.   The history is provided by the patient.  Sore Throat  This is a new problem. The current episode started more than 2 days ago. The problem has been gradually worsening. Associated symptoms include chest pain. The symptoms are aggravated by swallowing and coughing.    Past Medical History:  Diagnosis Date  . Cystitis, interstitial    chronic since 1999  . Diverticulitis   . Other malaise and fatigue   . Screening cholesterol level     Patient Active Problem List   Diagnosis Date Noted  . Vertigo 08/14/2015  . Neck pain 07/25/2015  . Vitamin D deficiency 06/26/2014  . Hyperglycemia 06/26/2014  . Screening cholesterol level 12/13/2012  . CYSTITIS, CHRONIC 04/14/2006    Past Surgical History:  Procedure Laterality Date  . INTERSTIM IMPLANT PLACEMENT     and removal in 2008  . TONSILLECTOMY     1980  . TUBAL LIGATION     1995    OB History    No data available       Home Medications    Prior to Admission medications   Medication Sig Start Date End Date Taking? Authorizing Provider  Calcium Carb-Cholecalciferol 600-200 MG-UNIT TABS Take 2 tablets by mouth daily.    Historical Provider, MD  cholecalciferol (VITAMIN D) 1000 UNITS tablet Take 1 tablet (1,000 Units total) by mouth daily. 06/27/14   Lind Covert, MD  hydrOXYzine (ATARAX/VISTARIL) 25 MG tablet Take 25 mg by mouth at bedtime.    Historical Provider, MD  meclizine (ANTIVERT) 25 MG tablet Take 1 tablet (25 mg total) by mouth 3 (three) times daily as needed for dizziness. Patient not taking: Reported on 11/26/2015 08/14/15   Vivi Barrack, MD  methadone (DOLOPHINE) 5 MG tablet Take 5 mg by mouth every 12 (twelve) hours.    Historical Provider, MD  Omega-3 Fatty Acids (FISH OIL PO)  Take by mouth.    Historical Provider, MD    Family History Family History  Problem Relation Age of Onset  . Stroke Father   . Breast cancer Maternal Grandmother   . Colon cancer Neg Hx     Social History Social History  Substance Use Topics  . Smoking status: Former Smoker    Quit date: 11/22/1985  . Smokeless tobacco: Never Used  . Alcohol use No     Allergies   Ivp dye [iodinated diagnostic agents] and Topiramate   Review of Systems Review of Systems  Constitutional: Negative.  Negative for fever.  HENT: Positive for congestion, postnasal drip, rhinorrhea and sore throat.   Respiratory: Positive for cough.   Cardiovascular: Positive for chest pain.  All other systems reviewed and are negative.    Physical Exam Triage Vital Signs ED Triage Vitals  Enc Vitals Group     BP 02/17/16 1306 123/83     Pulse Rate 02/17/16 1306 78     Resp 02/17/16 1306 18     Temp 02/17/16 1306 98.6 F (37 C)     Temp Source 02/17/16 1306 Oral     SpO2 02/17/16 1306 100 %     Weight --      Height --      Head Circumference --  Peak Flow --      Pain Score 02/17/16 1305 6     Pain Loc --      Pain Edu? --      Excl. in [place]? --    No data found.   Updated Vital Signs BP 123/83 (BP Location: Left Arm)   Pulse 78   Temp 98.6 F (37 C) (Oral)   Resp 18   LMP 09/06/2013   SpO2 100%   Visual Acuity Right Eye Distance:   Left Eye Distance:   Bilateral Distance:    Right Eye Near:   Left Eye Near:    Bilateral Near:     Physical Exam  Constitutional: She appears well-developed and well-nourished. No distress.  HENT:  Right Ear: External ear normal.  Left Ear: External ear normal.  Nose: Nose normal.  Mouth/Throat: Oropharynx is clear and moist.  Eyes: Conjunctivae are normal. Pupils are equal, round, and reactive to light.  Neck: Normal range of motion.  Cardiovascular: Normal rate, regular rhythm, normal heart sounds and intact distal pulses.     Pulmonary/Chest: Effort normal and breath sounds normal. She exhibits tenderness.  Skin: Skin is warm and dry.  Nursing note and vitals reviewed.    UC Treatments / Results  Labs (all labs ordered are listed, but only abnormal results are displayed) Labs Reviewed - No data to display  EKG  EKG Interpretation None       Radiology No results found.  Procedures Procedures (including critical care time)  Medications Ordered in UC Medications - No data to display   Initial Impression / Assessment and Plan / UC Course  I have reviewed the triage vital signs and the nursing notes.  Pertinent labs & imaging results that were available during my care of the patient were reviewed by me and considered in my medical decision making (see chart for details).  Clinical Course       Final Clinical Impressions(s) / UC Diagnoses   Final diagnoses:  None    New Prescriptions New Prescriptions   No medications on file     Billy Fischer, MD 03/02/16 2106

## 2016-02-17 NOTE — ED Triage Notes (Signed)
Onset Sunday of sore throat, since then right ear pain and chest hurts with coughing, coughing up dar, yellow phlegm.

## 2016-02-27 DIAGNOSIS — S93432A Sprain of tibiofibular ligament of left ankle, initial encounter: Secondary | ICD-10-CM | POA: Diagnosis not present

## 2016-06-23 ENCOUNTER — Telehealth: Payer: Self-pay | Admitting: Family Medicine

## 2016-06-23 NOTE — Telephone Encounter (Signed)
Left message requesting pt to call back and let us know if she's getting overdue HM done elsewhere. - Mesha Guinyard

## 2016-07-14 ENCOUNTER — Encounter: Payer: Medicaid Other | Admitting: Family Medicine

## 2016-08-04 ENCOUNTER — Ambulatory Visit (INDEPENDENT_AMBULATORY_CARE_PROVIDER_SITE_OTHER): Payer: Medicaid Other | Admitting: Family Medicine

## 2016-08-04 ENCOUNTER — Encounter: Payer: Self-pay | Admitting: Family Medicine

## 2016-08-04 VITALS — BP 120/74 | HR 73 | Temp 98.5°F | Ht 65.0 in | Wt 159.8 lb

## 2016-08-04 DIAGNOSIS — M722 Plantar fascial fibromatosis: Secondary | ICD-10-CM

## 2016-08-04 DIAGNOSIS — L989 Disorder of the skin and subcutaneous tissue, unspecified: Secondary | ICD-10-CM | POA: Insufficient documentation

## 2016-08-04 DIAGNOSIS — Z131 Encounter for screening for diabetes mellitus: Secondary | ICD-10-CM | POA: Diagnosis present

## 2016-08-04 DIAGNOSIS — Z0000 Encounter for general adult medical examination without abnormal findings: Secondary | ICD-10-CM | POA: Diagnosis not present

## 2016-08-04 LAB — POCT GLYCOSYLATED HEMOGLOBIN (HGB A1C): Hemoglobin A1C: 5.7

## 2016-08-04 NOTE — Assessment & Plan Note (Signed)
Hard to definitively diagnose.  Could be local irritation vs AK.  Trial of hydrocortisone See after visit summary

## 2016-08-04 NOTE — Assessment & Plan Note (Signed)
No signs of bone lesion - see after visit summary

## 2016-08-04 NOTE — Progress Notes (Signed)
Subjective  Patient is presenting for a physical exam  Feels well except for   Pain in right heel - worse when first walks on it.  No trauma.  No weakness or soft tissue swelling or redness  Scaly area left cheek - noticed on and off for weeks   Patient reports no  vision/ hearing changes,anorexia, weight change, fever ,adenopathy, persistant / recurrent hoarseness, swallowing issues, chest pain, edema,persistant / recurrent cough, hemoptysis, dyspnea(rest, exertional, paroxysmal nocturnal), gastrointestinal  bleeding (melena, rectal bleeding), abdominal pain, excessive heart burn, GU symptoms(dysuria, hematuria, pyuria, voiding/incontinence  Issues) syncope, focal weakness, severe memory loss, concerning skin lesions, depression, anxiety, abnormal bruising/bleeding, major joint swelling, breast masses or abnormal vaginal bleeding.      Chief Complaint noted Review of Symptoms - see HPI PMH - Smoking status noted.     Objective Vital Signs reviewed Ears:  External ear exam shows no significant lesions or deformities.  Otoscopic examination reveals clear canals, tympanic membranes are intact bilaterally without bulging, retraction, inflammation or discharge. Hearing is grossly normal bilaterall Neck:  No deformities, thyromegaly, masses, or tenderness noted.   Supple with full range of motion without pain.  Heart - Regular rate and rhythm.  No murmurs, gallops or rubs.    Lungs:  Normal respiratory effort, chest expands symmetrically. Lungs are clear to auscultation, no crackles or wheezes. Abdomen: soft and non-tender without masses, organomegaly or hernias noted.  No guarding or rebound Extremities:  No cyanosis, edema, or deformity noted with good range of motion of all major joints.    Tender over dorsum of heel on R - no redness or deformity Skin - irregular 1 cm superficial deficit on left cheek.    Assessments/Plans  No problem-specific Assessment & Plan notes found for this  encounter.   See Encounter view if individual problem A/Ps not visible See after visit summary for details of patient instuctions

## 2016-08-04 NOTE — Patient Instructions (Signed)
Good to see you today!  Thanks for coming in.  For the plantar fascitis use heel cups and gentle exercise, ice and can rolling  For the face lesion use hydrocortisone cream 1% twice a day for two weeks.  If the lesion does not go away completely then come back and will freeze

## 2016-08-05 ENCOUNTER — Encounter: Payer: Self-pay | Admitting: Family Medicine

## 2016-08-10 ENCOUNTER — Other Ambulatory Visit: Payer: Self-pay | Admitting: Family Medicine

## 2016-08-10 DIAGNOSIS — Z1231 Encounter for screening mammogram for malignant neoplasm of breast: Secondary | ICD-10-CM

## 2016-08-25 ENCOUNTER — Telehealth: Payer: Self-pay | Admitting: Family Medicine

## 2016-08-25 DIAGNOSIS — M722 Plantar fascial fibromatosis: Secondary | ICD-10-CM

## 2016-08-25 NOTE — Telephone Encounter (Signed)
I put in referral order Pls let her know Thanks

## 2016-08-25 NOTE — Telephone Encounter (Signed)
Patient called today requesting to be referred to:  Specialist: Podiatry Provider name/phone number: [facility] Diagnosis (code):  Plantar fasciitis Date of last OV:  08/04/2016  Patient's foot pain has gotten progressively worse since was seen in June. Patient would like this referral urgently.

## 2016-08-27 NOTE — Telephone Encounter (Signed)
Patient already aware due to appointment already being made. Jazmin Hartsell,CMA

## 2016-09-10 ENCOUNTER — Ambulatory Visit (INDEPENDENT_AMBULATORY_CARE_PROVIDER_SITE_OTHER): Payer: Medicaid Other

## 2016-09-10 ENCOUNTER — Ambulatory Visit (INDEPENDENT_AMBULATORY_CARE_PROVIDER_SITE_OTHER): Payer: Medicaid Other | Admitting: Podiatry

## 2016-09-10 ENCOUNTER — Encounter: Payer: Self-pay | Admitting: Podiatry

## 2016-09-10 ENCOUNTER — Ambulatory Visit: Payer: Medicaid Other | Admitting: Podiatry

## 2016-09-10 ENCOUNTER — Telehealth: Payer: Self-pay | Admitting: Podiatry

## 2016-09-10 VITALS — BP 127/72 | HR 75 | Resp 16 | Ht 65.0 in | Wt 158.0 lb

## 2016-09-10 DIAGNOSIS — M722 Plantar fascial fibromatosis: Secondary | ICD-10-CM

## 2016-09-10 MED ORDER — DICLOFENAC SODIUM 75 MG PO TBEC: 75.0000 mg | DELAYED_RELEASE_TABLET | Freq: Two times a day (BID) | ORAL | 2 refills | Status: DC

## 2016-09-10 MED ORDER — TRIAMCINOLONE ACETONIDE 10 MG/ML IJ SUSP
10.0000 mg | Freq: Once | INTRAMUSCULAR | Status: AC
  Administered 2016-09-10: 10 mg

## 2016-09-10 NOTE — Telephone Encounter (Signed)
Pt states wants rx sent to [facility]. I told pt it had been sent.

## 2016-09-10 NOTE — Telephone Encounter (Signed)
I was in the office this morning and went by my pharmacy to pick up the medication. I was told it needed a prior authorization so they could not fill it. If you could please call me back about this. Thank you.

## 2016-09-10 NOTE — Telephone Encounter (Signed)
Left message informing pt that [facility] had this medication on their $4.00 formulary and if she would call with a [facility] I would call in for her, or she would have to wait for the approval to come back next week.

## 2016-09-10 NOTE — Patient Instructions (Signed)

## 2016-09-10 NOTE — Progress Notes (Signed)
   Subjective:    Patient ID: Denise Aguirre, female    DOB: February 15, 1961, 56 y.o.   MRN: [id number]  HPI Chief Complaint  Patient presents with  . Foot Pain    Right foot; bottom of heel; x1 yr      Review of Systems  Genitourinary: Positive for frequency.  All other systems reviewed and are negative.      Objective:   Physical Exam        Assessment & Plan:

## 2016-09-12 NOTE — Progress Notes (Signed)
Subjective:    Patient ID: Denise Aguirre, female   DOB: 56 y.o.   MRN: [id number]   HPI patient states she's had a lot of pain in the bottom of her right heel for approximately one year. States that it's been getting gradually worse and it makes ambulation difficult    Review of Systems  All other systems reviewed and are negative.       Objective:  Physical Exam  Cardiovascular: Intact distal pulses.   Musculoskeletal: Normal range of motion.  Neurological: She is alert.  Skin: Skin is warm.  Nursing note and vitals reviewed.  neurovascular status intact muscle strength adequate range of motion within normal limits with patient noted to have exquisite discomfort plantar aspect right heel insertional point tendon calcaneus with inflammation fluid buildup noted. Good digital perfusion noted     Assessment:    Acute plantar fasciitis right     Plan:    H&P condition reviewed and careful injection administered 3 mg Kenalog 5 mg Xylocaine and brace dispensed. Gave instructions on physical therapy and reappoint to reevaluate  X-rays indicated spur with no indication of stress fracture advanced arthritis

## 2016-09-24 ENCOUNTER — Encounter: Payer: Self-pay | Admitting: Podiatry

## 2016-09-24 ENCOUNTER — Ambulatory Visit (INDEPENDENT_AMBULATORY_CARE_PROVIDER_SITE_OTHER): Payer: Medicaid Other | Admitting: Podiatry

## 2016-09-24 DIAGNOSIS — L309 Dermatitis, unspecified: Secondary | ICD-10-CM

## 2016-09-24 DIAGNOSIS — M722 Plantar fascial fibromatosis: Secondary | ICD-10-CM

## 2016-09-24 NOTE — Progress Notes (Signed)
Subjective:    Patient ID: Denise Aguirre, female   DOB: 56 y.o.   MRN: [id number]   HPI patient presents stating I'm improved on my heel with minimal discomfort and I do have some irritation of my posterior heel left    ROS      Objective:  Physical Exam 2 separate problems with plantar fasciitis right and some instability left     Assessment:    H&P condition reviewed and plantar fasciitis dermatitis review     Plan:    Very pleased with how patient's doing instructed on physical therapy anti-inflammatories and types of creams for the left foot. Reappoint as needed

## 2016-09-27 ENCOUNTER — Ambulatory Visit
Admission: RE | Admit: 2016-09-27 | Discharge: 2016-09-27 | Disposition: A | Discharge status: Home / Self Care | Payer: Medicaid Other | Source: Ambulatory Visit | Attending: Family Medicine | Admitting: Family Medicine

## 2016-09-27 DIAGNOSIS — Z1231 Encounter for screening mammogram for malignant neoplasm of breast: Secondary | ICD-10-CM

## 2016-11-01 ENCOUNTER — Telehealth: Payer: Self-pay | Admitting: Family Medicine

## 2016-11-01 NOTE — Telephone Encounter (Signed)
Pt would like a referral to a dermatogist.  Please advise

## 2016-11-07 ENCOUNTER — Encounter: Payer: Self-pay | Admitting: Family Medicine

## 2016-11-07 DIAGNOSIS — L989 Disorder of the skin and subcutaneous tissue, unspecified: Secondary | ICD-10-CM

## 2016-11-07 NOTE — Telephone Encounter (Signed)
Sent patient a mychart message.

## 2016-11-11 ENCOUNTER — Encounter: Payer: Self-pay | Admitting: Podiatry

## 2016-11-11 ENCOUNTER — Ambulatory Visit (INDEPENDENT_AMBULATORY_CARE_PROVIDER_SITE_OTHER): Payer: Medicaid Other | Admitting: Podiatry

## 2016-11-11 DIAGNOSIS — M722 Plantar fascial fibromatosis: Secondary | ICD-10-CM

## 2016-11-11 MED ORDER — TRIAMCINOLONE ACETONIDE 10 MG/ML IJ SUSP
10.0000 mg | Freq: Once | INTRAMUSCULAR | Status: AC
  Administered 2016-11-11: 10 mg

## 2016-11-11 NOTE — Progress Notes (Signed)
Subjective:    Patient ID: Denise Aguirre, female   DOB: 56 y.o.   MRN: [id number]   HPI patient states that the right heel has started to become sore again and she was running and that's when it started    ROS      Objective:  Physical Exam neurovascular status intact with inflammation pain in the right plantar fascia at the insertional point tendon into the calcaneus     Assessment:    Acute plantar fasciitis right heel     Plan:    Reinjected the plantar fascia 3 Milligan Kenalog 5 mill grams Xylocaine and instructed on reduced heavy activity and reappoint to recheck

## 2016-12-08 ENCOUNTER — Ambulatory Visit (INDEPENDENT_AMBULATORY_CARE_PROVIDER_SITE_OTHER): Payer: Medicaid Other | Admitting: *Deleted

## 2016-12-08 DIAGNOSIS — Z23 Encounter for immunization: Secondary | ICD-10-CM | POA: Diagnosis not present

## 2017-01-21 ENCOUNTER — Encounter: Payer: Self-pay | Admitting: Podiatry

## 2017-01-21 ENCOUNTER — Ambulatory Visit: Payer: Medicaid Other | Admitting: Podiatry

## 2017-01-21 DIAGNOSIS — M722 Plantar fascial fibromatosis: Secondary | ICD-10-CM

## 2017-01-21 MED ORDER — TRIAMCINOLONE ACETONIDE 10 MG/ML IJ SUSP
10.0000 mg | Freq: Once | INTRAMUSCULAR | Status: AC
  Administered 2017-01-21: 10 mg

## 2017-01-21 NOTE — Progress Notes (Signed)
Subjective:   Patient ID: Denise Aguirre, female   DOB: 56 y.o.   MRN: [id number]   HPI Patient presents stating she is still getting some pain in the right heel and she is concerned about this and states she had a good period of relief but has had recurrence.   ROS      Objective:  Physical Exam  Neurovascular status intact muscle strength adequate range of motion within normal limits with patient found to have continued discomfort right plantar fascia insertional point in the calcaneus     Assessment:  H&P and condition reviewed and continues to have plantar fascial symptomatology right.       Plan:  Reviewed condition and reinjected the plantar fascia 3 mg Kenalog 5 mg Xylocaine and discussed orthotics which I think would be of great benefit to her.  Patient will think about orthotics and make a decision

## 2017-03-30 ENCOUNTER — Telehealth: Payer: Self-pay | Admitting: Family Medicine

## 2017-03-30 DIAGNOSIS — M722 Plantar fascial fibromatosis: Secondary | ICD-10-CM

## 2017-03-30 NOTE — Telephone Encounter (Signed)
Pt needs referral to Dr Doran Durand at  [facility] for [employer].  She has seen a podatrist and there has been no improvement. Please advise

## 2017-04-04 ENCOUNTER — Telehealth: Payer: Self-pay

## 2017-04-04 NOTE — Telephone Encounter (Signed)
Pt calling again about this referral. She wants it done today

## 2017-04-04 NOTE — Telephone Encounter (Signed)
Called and informed patient that a referral had been placed for ortho.Ozella Almond, CMA

## 2017-04-04 NOTE — Telephone Encounter (Signed)
Please let her know I put in a referral today  Thanks  Winter

## 2017-05-19 ENCOUNTER — Encounter: Payer: Self-pay | Admitting: Family Medicine

## 2017-05-19 ENCOUNTER — Other Ambulatory Visit: Payer: Self-pay

## 2017-05-19 ENCOUNTER — Ambulatory Visit: Payer: Medicaid Other | Admitting: Family Medicine

## 2017-05-19 VITALS — BP 104/60 | HR 90 | Temp 97.9°F

## 2017-05-19 DIAGNOSIS — R399 Unspecified symptoms and signs involving the genitourinary system: Secondary | ICD-10-CM | POA: Diagnosis not present

## 2017-05-19 LAB — POCT URINALYSIS DIP (MANUAL ENTRY)
Glucose, UA: NEGATIVE mg/dL
NITRITE UA: NEGATIVE
Protein Ur, POC: NEGATIVE mg/dL
RBC UA: NEGATIVE
SPEC GRAV UA: 1.025 (ref 1.010–1.025)
UROBILINOGEN UA: 0.2 U/dL
pH, UA: 6 (ref 5.0–8.0)

## 2017-05-19 MED ORDER — SULFAMETHOXAZOLE-TRIMETHOPRIM 800-160 MG PO TABS: 1.0000 | ORAL_TABLET | Freq: Two times a day (BID) | ORAL | 0 refills | Status: DC

## 2017-05-19 NOTE — Patient Instructions (Signed)

## 2017-05-19 NOTE — Progress Notes (Signed)
   Subjective:    Patient ID: Denise Aguirre is a 57 y.o. female presenting with No chief complaint on file.  on 05/19/2017  HPI: Had GI bug over the last weekend. Has had some fever. Is dehydrated. Low back pain is worse. Pelvic pain with her IC. Urine has an odor. No dysuria. Some frequency. Has similar symptoms to IC. Unsure if she has a UTI. Sees Dr. Amalia Hailey for this.  Review of Systems  Constitutional: Negative for chills and fever.  Respiratory: Negative for shortness of breath.   Cardiovascular: Negative for chest pain.  Gastrointestinal: Negative for abdominal pain, nausea and vomiting.  Genitourinary: Positive for frequency, pelvic pain and urgency. Negative for dysuria and hematuria.  Skin: Negative for rash.      Objective:    BP 104/60   Pulse 90   Temp 97.9 F (36.6 C) (Oral)   LMP 09/06/2013   SpO2 97%  Physical Exam  Constitutional: She is oriented to person, place, and time. She appears well-developed and well-nourished. No distress.  HENT:  Head: Normocephalic and atraumatic.  Eyes: No scleral icterus.  Neck: Neck supple.  Cardiovascular: Normal rate.  Pulmonary/Chest: Effort normal.  Abdominal: Soft. There is no tenderness.  Musculoskeletal: She exhibits no edema or tenderness.  Neurological: She is alert and oriented to person, place, and time.  Skin: Skin is warm and dry.  Psychiatric: She has a normal mood and affect.        Assessment & Plan:  UTI symptoms - Will treat presumptively with 3d course. Check culture. If no improvement, f/u with urology-Dr. Amalia Hailey. - Plan: POCT urinalysis dipstick, sulfamethoxazole-trimethoprim (BACTRIM DS,SEPTRA DS) 800-160 MG tablet, Urine Culture   Total face-to-face time with patient: 10 minutes. Over 50% of encounter was spent on counseling and coordination of care. Return if symptoms worsen or fail to improve.  [name] 05/19/2017 3:10 PM

## 2017-05-21 LAB — URINE CULTURE

## 2017-05-23 ENCOUNTER — Telehealth: Payer: Self-pay | Admitting: Family Medicine

## 2017-05-23 NOTE — Telephone Encounter (Signed)
Pt was seen on 4-4 and had a urine culture done. She hasn't bee called with the results of that urine culture and doesn't know if she needs to continue taking the meds she was given or if she needs something different. Please call pt to discuss this.

## 2017-05-23 NOTE — Telephone Encounter (Signed)
Called left Vm to call our office with times to call

## 2017-05-31 ENCOUNTER — Ambulatory Visit: Payer: Medicaid Other | Admitting: Family Medicine

## 2017-05-31 ENCOUNTER — Other Ambulatory Visit: Payer: Self-pay

## 2017-05-31 ENCOUNTER — Encounter: Payer: Self-pay | Admitting: Family Medicine

## 2017-05-31 DIAGNOSIS — L570 Actinic keratosis: Secondary | ICD-10-CM | POA: Insufficient documentation

## 2017-05-31 NOTE — Progress Notes (Signed)
Subjective  Denise Aguirre is a 57 y.o. female is presenting with the following  AK Scaly red area above L eye.  Comes and goes when picks it off.  Had an AK frozen a few years ago.   Chief Complaint noted Review of Symptoms - see HPI PMH - Smoking status noted.    Objective Vital Signs reviewed BP 112/68   Pulse 70   Temp 98.7 F (37.1 C) (Oral)   Ht 5\' 5"  (1.651 m)   Wt 159 lb 12.8 oz (72.5 kg)   LMP 09/06/2013   SpO2 98%   BMI 26.59 kg/m   Left forehead above eye brow - 1 cm area of erythema an scale consistent with a AK No other AKs seen on face  Procedure Freeze thaw freeze of face AK Tolerated well   Assessments/Plans  See after visit summary for details of patient instuctions  AK (actinic keratosis) Frozen.  No others seen.  Cautioned to keep looking for and wear sun screen

## 2017-05-31 NOTE — Assessment & Plan Note (Signed)
Frozen.  No others seen.  Cautioned to keep looking for and wear sun screen

## 2017-05-31 NOTE — Patient Instructions (Signed)
Good to see you today!  Thanks for coming in.  Call if the lesion looks infected - pus or red streaks  It should blister in a day or two then heal over Wear sun screen all the time  Be on the look out for more actinic keratosis  Come back in one year for a check up

## 2017-07-27 DIAGNOSIS — M722 Plantar fascial fibromatosis: Secondary | ICD-10-CM | POA: Diagnosis not present

## 2017-08-02 ENCOUNTER — Telehealth: Payer: Self-pay | Admitting: Family Medicine

## 2017-08-02 DIAGNOSIS — H547 Unspecified visual loss: Secondary | ICD-10-CM

## 2017-08-02 NOTE — Telephone Encounter (Signed)
Pt would like to have a referral for an ophthalmologist.

## 2017-08-05 NOTE — Telephone Encounter (Signed)
I would need a reason for a referral.  Please contact her and ask  Thanks  [place]

## 2017-08-08 NOTE — Telephone Encounter (Signed)
Called patient and left message for her to call back concerning a referral for an ophthalmologist referral.  If patient calls back please get a reason why she needs this referral per Dr. Erin Hearing.  Thanks  .Ozella Almond, CMA

## 2017-08-12 NOTE — Telephone Encounter (Signed)
Called patient again and left voice message for her to call the office concerning the reason for her request for a referral to  opthalmology.

## 2017-08-15 NOTE — Telephone Encounter (Signed)
Pt was calling to returning [facility] phone call. She said the reason she is needing the referral is because she has not been in years and she is having a hard time seeing.

## 2017-08-16 DIAGNOSIS — G894 Chronic pain syndrome: Secondary | ICD-10-CM | POA: Diagnosis not present

## 2017-08-16 DIAGNOSIS — Z5181 Encounter for therapeutic drug level monitoring: Secondary | ICD-10-CM | POA: Diagnosis not present

## 2017-08-16 DIAGNOSIS — Z79899 Other long term (current) drug therapy: Secondary | ICD-10-CM | POA: Diagnosis not present

## 2017-08-16 DIAGNOSIS — M549 Dorsalgia, unspecified: Secondary | ICD-10-CM | POA: Diagnosis not present

## 2017-08-16 DIAGNOSIS — R102 Pelvic and perineal pain: Secondary | ICD-10-CM | POA: Diagnosis not present

## 2017-08-16 DIAGNOSIS — N3010 Interstitial cystitis (chronic) without hematuria: Secondary | ICD-10-CM | POA: Diagnosis not present

## 2017-08-16 NOTE — Telephone Encounter (Signed)
Pt called again checking on this referral. Reason for the referral is in the message before this one when she called yesterday. Pt would like to know when this referral can be placed. Please advise

## 2017-08-19 DIAGNOSIS — H547 Unspecified visual loss: Secondary | ICD-10-CM | POA: Insufficient documentation

## 2017-08-19 NOTE — Telephone Encounter (Signed)
LMOVM informing pt. Evana Runnels, CMA  

## 2017-08-19 NOTE — Telephone Encounter (Signed)
Please let her know I put in a referral to optometry who will check her vision and suggest further evaluation as needed   Thanks

## 2017-08-30 DIAGNOSIS — H04123 Dry eye syndrome of bilateral lacrimal glands: Secondary | ICD-10-CM | POA: Diagnosis not present

## 2017-09-05 ENCOUNTER — Other Ambulatory Visit: Payer: Self-pay | Admitting: Family Medicine

## 2017-09-05 DIAGNOSIS — Z1231 Encounter for screening mammogram for malignant neoplasm of breast: Secondary | ICD-10-CM

## 2017-09-29 ENCOUNTER — Ambulatory Visit: Payer: Medicaid Other

## 2017-10-25 ENCOUNTER — Ambulatory Visit
Admission: RE | Admit: 2017-10-25 | Discharge: 2017-10-25 | Disposition: A | Discharge status: Home / Self Care | Payer: Medicaid Other | Source: Ambulatory Visit | Attending: Family Medicine | Admitting: Family Medicine

## 2017-10-25 DIAGNOSIS — Z1231 Encounter for screening mammogram for malignant neoplasm of breast: Secondary | ICD-10-CM | POA: Diagnosis not present

## 2017-10-26 ENCOUNTER — Other Ambulatory Visit: Payer: Self-pay | Admitting: Family Medicine

## 2017-10-26 DIAGNOSIS — R928 Other abnormal and inconclusive findings on diagnostic imaging of breast: Secondary | ICD-10-CM

## 2017-10-31 ENCOUNTER — Ambulatory Visit
Admission: RE | Admit: 2017-10-31 | Discharge: 2017-10-31 | Disposition: A | Discharge status: Home / Self Care | Payer: Medicaid Other | Source: Ambulatory Visit | Attending: Family Medicine | Admitting: Family Medicine

## 2017-10-31 DIAGNOSIS — R922 Inconclusive mammogram: Secondary | ICD-10-CM | POA: Diagnosis not present

## 2017-10-31 DIAGNOSIS — R928 Other abnormal and inconclusive findings on diagnostic imaging of breast: Secondary | ICD-10-CM

## 2017-10-31 DIAGNOSIS — N6489 Other specified disorders of breast: Secondary | ICD-10-CM | POA: Diagnosis not present

## 2017-11-01 ENCOUNTER — Other Ambulatory Visit: Payer: Medicaid Other

## 2017-11-02 ENCOUNTER — Ambulatory Visit: Payer: Medicaid Other

## 2017-11-09 ENCOUNTER — Ambulatory Visit (INDEPENDENT_AMBULATORY_CARE_PROVIDER_SITE_OTHER): Payer: Medicaid Other

## 2017-11-09 DIAGNOSIS — H5213 Myopia, bilateral: Secondary | ICD-10-CM | POA: Diagnosis not present

## 2017-11-09 DIAGNOSIS — Z23 Encounter for immunization: Secondary | ICD-10-CM

## 2017-11-09 NOTE — Progress Notes (Signed)
Pt presents in nurse clinic for flu injection. Injection given LD, site unremarkable. Epic and [facility] have been updated.

## 2017-11-15 DIAGNOSIS — Z5181 Encounter for therapeutic drug level monitoring: Secondary | ICD-10-CM | POA: Diagnosis not present

## 2017-11-15 DIAGNOSIS — Z79899 Other long term (current) drug therapy: Secondary | ICD-10-CM | POA: Diagnosis not present

## 2017-11-15 DIAGNOSIS — G894 Chronic pain syndrome: Secondary | ICD-10-CM | POA: Diagnosis not present

## 2017-11-15 DIAGNOSIS — R102 Pelvic and perineal pain: Secondary | ICD-10-CM | POA: Diagnosis not present

## 2017-11-15 DIAGNOSIS — G8929 Other chronic pain: Secondary | ICD-10-CM | POA: Diagnosis not present

## 2017-11-16 ENCOUNTER — Other Ambulatory Visit: Payer: Medicaid Other

## 2017-11-16 DIAGNOSIS — Z13220 Encounter for screening for lipoid disorders: Secondary | ICD-10-CM | POA: Diagnosis not present

## 2017-11-17 ENCOUNTER — Other Ambulatory Visit: Payer: Medicaid Other

## 2017-11-17 LAB — CBC
Hematocrit: 40.4 % (ref 34.0–46.6)
Hemoglobin: 13.2 g/dL (ref 11.1–15.9)
MCH: 28.6 pg (ref 26.6–33.0)
MCHC: 32.7 g/dL (ref 31.5–35.7)
MCV: 87 fL (ref 79–97)
Platelets: 226 10*3/uL (ref 150–450)
RBC: 4.62 x10E6/uL (ref 3.77–5.28)
RDW: 12.9 % (ref 12.3–15.4)
WBC: 4.2 10*3/uL (ref 3.4–10.8)

## 2017-11-17 LAB — LIPID PANEL
CHOL/HDL RATIO: 3.4 ratio (ref 0.0–4.4)
Cholesterol, Total: 235 mg/dL — ABNORMAL HIGH (ref 100–199)
HDL: 70 mg/dL (ref >39)
LDL Calculated: 154 mg/dL — ABNORMAL HIGH (ref 0–99)
Triglycerides: 57 mg/dL (ref 0–149)
VLDL Cholesterol Cal: 11 mg/dL (ref 5–40)

## 2017-11-21 ENCOUNTER — Telehealth: Payer: Self-pay | Admitting: Family Medicine

## 2017-11-21 ENCOUNTER — Encounter: Payer: Self-pay | Admitting: Family Medicine

## 2017-11-21 NOTE — Telephone Encounter (Signed)
Patient keeps calling and asking to get her lab results.  She asked for someone to call her today and she is worried as to why she cannot see it on mychart.

## 2017-11-21 NOTE — Telephone Encounter (Signed)
I sent her a message in Mychart about her labs

## 2017-11-21 NOTE — Telephone Encounter (Signed)
Patient was calling for her lab results because she did not see them in 'Mychart'.  Please call her as soon as possible, thank you.

## 2017-11-22 ENCOUNTER — Telehealth: Payer: Self-pay | Admitting: Family Medicine

## 2017-11-22 NOTE — Telephone Encounter (Signed)
Will forward to red team.  Denise Aguirre

## 2017-11-22 NOTE — Telephone Encounter (Signed)
Please let her know we can do an A1c when she comes in for a visit and will have the results that day before she leaves  Thanks  LC

## 2017-11-22 NOTE — Telephone Encounter (Signed)
Pt is calling to see what her A1C was. If they did not check A1C she would like to have that done. jw

## 2017-11-22 NOTE — Telephone Encounter (Signed)
Pt informed and she requested an appt for next week. So she will be seeing dr brown. Also she wants to know if you could put her results on my chart. Bronte Kropf Kennon Holter, CMA

## 2017-11-28 DIAGNOSIS — H5213 Myopia, bilateral: Secondary | ICD-10-CM | POA: Diagnosis not present

## 2017-12-02 ENCOUNTER — Other Ambulatory Visit: Payer: Self-pay

## 2017-12-02 ENCOUNTER — Encounter: Payer: Self-pay | Admitting: Family Medicine

## 2017-12-02 ENCOUNTER — Ambulatory Visit (INDEPENDENT_AMBULATORY_CARE_PROVIDER_SITE_OTHER): Payer: Medicaid Other | Admitting: Family Medicine

## 2017-12-02 VITALS — BP 124/78 | HR 71 | Temp 98.1°F | Ht 65.0 in | Wt 159.4 lb

## 2017-12-02 DIAGNOSIS — Z131 Encounter for screening for diabetes mellitus: Secondary | ICD-10-CM | POA: Diagnosis not present

## 2017-12-02 LAB — POCT GLYCOSYLATED HEMOGLOBIN (HGB A1C): HEMOGLOBIN A1C: 5.5 % (ref 4.0–5.6)

## 2017-12-02 NOTE — Patient Instructions (Signed)
It was wonderful to see you today.  Thank you for choosing  Family Medicine.   Please call [phone] with any questions about today's appointment.  Please be sure to schedule follow up at the front  desk before you leave today.   Aldo Sondgeroth, MD  Family Medicine    

## 2017-12-02 NOTE — Progress Notes (Signed)
  Patient Name: Denise Aguirre Date of Birth: [birth year] Date of Visit: 12/02/17 PCP: Lind Covert, MD  Chief Complaint: Questions about blood test  Subjective: Denise Aguirre is a pleasant 57 y.o. year old woman with a history of history of visual cystitis and methadone use presenting today for lab check.  Patient was recently seen for an annual wellness exam.  Her lipid panel at that time was notable for mildly elevated LDL.  She is a bit concerned about this.  Her ten-year ASCVD risk score is 2%.  The patient has a history of gestational diabetes.  She would like to be screened for diabetes.  Denies signs or symptoms of diabetes including but not limited to polyuria and polydipsia.  She denies vision changes.   ROS:  ROS Negative except for as above  I have reviewed the patient's medical, surgical, family, and social history as appropriate.   Vitals:   12/02/17 1115  BP: 124/78  Pulse: 71  Temp: 98.1 F (36.7 C)  SpO2: 98%   Filed Weights   12/02/17 1115  Weight: 159 lb 6.4 oz (72.3 kg)   Warm well perfused Comfortably on room air Cranial nerves grossly intact Pleasant warm affect  Tosha was seen today for labs only.  Diagnoses and all orders for this visit:  Screening for diabetes mellitus -     HgB A1c   Dorris Singh, MD  Family Medicine Teaching Service

## 2017-12-06 DIAGNOSIS — L438 Other lichen planus: Secondary | ICD-10-CM | POA: Diagnosis not present

## 2017-12-22 ENCOUNTER — Ambulatory Visit: Payer: Medicaid Other

## 2018-02-14 DIAGNOSIS — G894 Chronic pain syndrome: Secondary | ICD-10-CM | POA: Diagnosis not present

## 2018-02-14 DIAGNOSIS — R102 Pelvic and perineal pain: Secondary | ICD-10-CM | POA: Diagnosis not present

## 2018-02-14 DIAGNOSIS — G8929 Other chronic pain: Secondary | ICD-10-CM | POA: Diagnosis not present

## 2018-02-17 ENCOUNTER — Telehealth: Payer: Self-pay | Admitting: Gastroenterology

## 2018-02-17 NOTE — Telephone Encounter (Signed)
Pt called in wanting to ask question about lab results from last colon 4.4.2017

## 2018-02-17 NOTE — Telephone Encounter (Signed)
All questions answered for the patient.  She will call back for any additional questions or concerns.

## 2018-04-06 ENCOUNTER — Ambulatory Visit: Payer: Medicaid Other

## 2018-04-07 ENCOUNTER — Ambulatory Visit: Payer: Medicaid Other | Admitting: Family Medicine

## 2018-04-07 ENCOUNTER — Encounter: Payer: Self-pay | Admitting: Family Medicine

## 2018-04-07 VITALS — BP 109/70 | HR 69 | Temp 98.3°F

## 2018-04-07 DIAGNOSIS — H6981 Other specified disorders of Eustachian tube, right ear: Secondary | ICD-10-CM

## 2018-04-07 MED ORDER — FLUTICASONE PROPIONATE 50 MCG/ACT NA SUSP: 2.0000 | Freq: Every day | NASAL | 6 refills | Status: DC

## 2018-04-07 NOTE — Progress Notes (Signed)
  Patient Name: Denise Aguirre Date of Birth: Mar 23, 1960 Date of Visit: 04/07/18 PCP: Lind Covert, MD  Chief Complaint:  Right ear fullness   Subjective: Denise Aguirre is a pleasant 58 y.o.  With history of low vitamin D presenting with right ear fullness and nausea when sitting down once. She reports she had influenza three weeks ago. She had fatigue, myalgias, and some chills. She had a mild pharyngitis and congestion. This has largely resolved. She has had a week or more of right ear fullness. She also had one episode of mild nausea when she sat down to eat. No vertigo, falls, chest pain, difficulty breathing, fevers, or dizziness--endorses sensation of ear full and dysequilibrium at times. No headaches, vomiting, or recurrence of this nausea. She has not tried anything specific for her symptoms.   ROS: as above  ROS  I have reviewed the patient's medical, surgical, family, and social history as appropriate.   Vitals:   04/07/18 1126  BP: 109/70  Pulse: 69  Temp: 98.3 F (36.8 C)  SpO2: 100%   Filed Weights   HEENT: Sclera anicteric. Dentition is moderate. Appears well hydrated. Initial exam notable for EACs with moderate dark, hard cerumen, removed by CMA easily Both TMs visualized, left minimally retracted, no bulging or erythema, right has a small effusion, clear, normal cone of light, no erythema  Neck: Supple Cardiac: Regular rate and rhythm. Normal S1/S2. No murmurs, rubs, or gallops appreciated. Lungs: Clear bilaterally to ascultation.  Skin: Warm, dry Psych: Pleasant and appropriate   Denise Aguirre was seen today for ear fullness.  Diagnoses and all orders for this visit:  Dysfunction of right eustachian tube, no signs of BPPV, orthostasis, likely secondary to post viral nasal congestion. Cerumen was moderate, may be playing role as well.  -     fluticasone (FLONASE) 50 MCG/ACT nasal spray; Place 2 sprays into both nostrils daily. Then reduce to 1  spray daily in both nostrils - Discussed diagnosis at length - For cerumen, consider Debrox, avoid Qtips - Consider addition of anti-histamine     Dorris Singh, MD  [facility] Medicine Teaching Service

## 2018-04-07 NOTE — Patient Instructions (Addendum)
I think your Eustachian tube (see details below) is acting up causing your symptoms  Flonase- 2 sprays in each nostril once a day for a week then go to 1 spray in each nostril once per day   You can add Claritin or Zyrtec for your nasal congestion and ear fullness if your symptoms are not improving next week   Please call or return to care if your symptoms worsen or do not improve in the next 1-2 weeks     To reduce the amount of ear wax building up you can try Debrox (over the counter)---  It was wonderful to see you today.  Thank you for choosing [facility].   Please call [phone] with any questions about today's appointment.  Please be sure to schedule follow up at the front  desk before you leave today.   Dorris Singh, MD  Family Medicine     Eustachian Tube Dysfunction  Eustachian tube dysfunction refers to a condition in which a blockage develops in the narrow passage that connects the middle ear to the back of the nose (eustachian tube). The eustachian tube regulates air pressure in the middle ear by letting air move between the ear and nose. It also helps to drain fluid from the middle ear space. Eustachian tube dysfunction can affect one or both ears. When the eustachian tube does not function properly, air pressure, fluid, or both can build up in the middle ear. What are the causes? This condition occurs when the eustachian tube becomes blocked or cannot open normally. Common causes of this condition include:  Ear infections.  Colds and other infections that affect the nose, mouth, and throat (upper respiratory tract).  Allergies.  Irritation from cigarette smoke.  Irritation from stomach acid coming up into the esophagus (gastroesophageal reflux). The esophagus is the tube that carries food from the mouth to the stomach.  Sudden changes in air pressure, such as from descending in an airplane or scuba diving.  Abnormal growths in the nose or  throat, such as: ? Growths that line the nose (nasal polyps). ? Abnormal growth of cells (tumors). ? Enlarged tissue at the back of the throat (adenoids). What increases the risk? You are more likely to develop this condition if:  You smoke.  You are overweight.  You are a child who has: ? Certain birth defects of the mouth, such as cleft palate. ? Large tonsils or adenoids. What are the signs or symptoms? Common symptoms of this condition include:  A feeling of fullness in the ear.  Ear pain.  Clicking or popping noises in the ear.  Ringing in the ear.  Hearing loss.  Loss of balance.  Dizziness. Symptoms may get worse when the air pressure around you changes, such as when you travel to an area of high elevation, fly on an airplane, or go scuba diving. How is this diagnosed? This condition may be diagnosed based on:  Your symptoms.  A physical exam of your ears, nose, and throat. How is this treated? Treatment depends on the cause and severity of your condition.  In mild cases, you may relieve your symptoms by moving air into your ears. This is called "popping the ears."  In more severe cases, or if you have symptoms of fluid in your ears, treatment may include: ? Medicines to relieve congestion (decongestants). ? Medicines that treat allergies (antihistamines). ? Nasal sprays or ear drops that contain medicines that reduce swelling (steroids). ? A procedure to  drain the fluid in your eardrum (myringotomy). In this procedure, a small tube is placed in the eardrum to:  Drain the fluid.  Restore the air in the middle ear space. ? A procedure to insert a balloon device through the nose to inflate the opening of the eustachian tube (balloon dilation). Follow these instructions at home: Lifestyle   Do not use any products that contain nicotine or tobacco, such as cigarettes and e-cigarettes. If you need help quitting, ask your health care provider.  Keep your  ears dry. Wear fitted earplugs during showering and bathing. Dry your ears completely after. General instructions  Take over-the-counter and prescription medicines only as told by your health care provider.  Use techniques to help pop your ears as recommended by your health care provider. These may include: ? Chewing gum. ? Yawning. ? Frequent, forceful swallowing. ? Closing your mouth, holding your nose closed, and gently blowing as if you are trying to blow air out of your nose.  Keep all follow-up visits as told by your health care provider. This is important. Contact a health care provider if:  Your symptoms do not go away after treatment.  Your symptoms come back after treatment.  You are unable to pop your ears.  You have: ? A fever. ? Pain in your ear. ? Pain in your head or neck. ? Fluid draining from your ear.  Your hearing suddenly changes.  You become very dizzy.  You lose your balance. Summary  Eustachian tube dysfunction refers to a condition in which a blockage develops in the eustachian tube.  It can be caused by ear infections, allergies, inhaled irritants, or abnormal growths in the nose or throat.  Symptoms include ear pain, hearing loss, or ringing in the ears.  Mild cases are treated with maneuvers to unblock the ears, such as yawning or ear popping.  Severe cases are treated with medicines. Surgery may also be done (rare). This information is not intended to replace advice given to you by your health care provider. Make sure you discuss any questions you have with your health care provider. Document Released: 02/28/2015 Document Revised: 05/24/2017 Document Reviewed: 05/24/2017 Elsevier Interactive Patient Education  2019 [employer].

## 2018-04-13 DIAGNOSIS — Z79899 Other long term (current) drug therapy: Secondary | ICD-10-CM | POA: Diagnosis not present

## 2018-04-13 DIAGNOSIS — Z5181 Encounter for therapeutic drug level monitoring: Secondary | ICD-10-CM | POA: Diagnosis not present

## 2018-04-13 DIAGNOSIS — G8929 Other chronic pain: Secondary | ICD-10-CM | POA: Diagnosis not present

## 2018-04-13 DIAGNOSIS — R102 Pelvic and perineal pain: Secondary | ICD-10-CM | POA: Diagnosis not present

## 2018-04-13 DIAGNOSIS — G894 Chronic pain syndrome: Secondary | ICD-10-CM | POA: Diagnosis not present

## 2018-04-13 DIAGNOSIS — N3010 Interstitial cystitis (chronic) without hematuria: Secondary | ICD-10-CM | POA: Diagnosis not present

## 2018-06-08 DIAGNOSIS — G894 Chronic pain syndrome: Secondary | ICD-10-CM | POA: Diagnosis not present

## 2018-06-27 ENCOUNTER — Encounter: Payer: Self-pay | Admitting: Family Medicine

## 2018-06-27 ENCOUNTER — Telehealth: Payer: Medicaid Other | Admitting: Family Medicine

## 2018-06-27 ENCOUNTER — Telehealth (INDEPENDENT_AMBULATORY_CARE_PROVIDER_SITE_OTHER): Payer: Medicaid Other | Admitting: Family Medicine

## 2018-06-27 ENCOUNTER — Other Ambulatory Visit: Payer: Self-pay

## 2018-06-27 DIAGNOSIS — B309 Viral conjunctivitis, unspecified: Secondary | ICD-10-CM

## 2018-06-27 NOTE — Progress Notes (Signed)
[facility] Telemedicine Visit  Patient consented to have virtual visit. Method of visit: Video  Encounter participants: Patient: Denise Aguirre - located at home Provider: Lovenia Kim - located at clinic Others (if applicable): none  Chief Complaint: itchy watery right eye   HPI:  Right eye redness Patient describes watering and itching of right eye ever since contact with someone pressure washing the sidewalk on Friday.  She was unable to see clearly for a few minutes and this improved after rinsing her eye with cool water.  Eye has been irritated since.  Has not tried any OTC medications for it yet.  No drainage or pus noted.   No sick contacts with similar symptoms.  She reports normal vision. No fever, headache, cough, congestion or runny nose.   ROS: per HPI  Pertinent PMHx: plantar fasciitis, actinic keratosis, vitamin D deficiency   Exam:  General: 58 yo female, NAD  HEENT: NCAT, EOMI, PERRL, right eye with mild conjunctival injection, no evidence of pus or drainage, no involvement of left eye  Respiratory: no distress, speaking in full sentences   Assessment/Plan:  No problem-specific Assessment & Plan notes found for this encounter.  Viral conjunctivitis  Clinically c/w viral conjunctivitis vs chemosis Advised use of cold compresses and OTC artificial tears  Avoid rubbing eyes to reduce irritation  Discussed usually resolves over 2-3 weeks Return precautions discussed    Time spent during visit with patient: 15 minutes

## 2018-06-27 NOTE — Progress Notes (Signed)
Opened in error

## 2018-07-13 DIAGNOSIS — G894 Chronic pain syndrome: Secondary | ICD-10-CM | POA: Diagnosis not present

## 2018-07-13 DIAGNOSIS — N3010 Interstitial cystitis (chronic) without hematuria: Secondary | ICD-10-CM | POA: Diagnosis not present

## 2018-07-13 DIAGNOSIS — G8929 Other chronic pain: Secondary | ICD-10-CM | POA: Diagnosis not present

## 2018-07-13 DIAGNOSIS — R102 Pelvic and perineal pain: Secondary | ICD-10-CM | POA: Diagnosis not present

## 2018-08-08 DIAGNOSIS — N3010 Interstitial cystitis (chronic) without hematuria: Secondary | ICD-10-CM | POA: Diagnosis not present

## 2018-08-08 DIAGNOSIS — G8929 Other chronic pain: Secondary | ICD-10-CM | POA: Diagnosis not present

## 2018-08-08 DIAGNOSIS — G894 Chronic pain syndrome: Secondary | ICD-10-CM | POA: Diagnosis not present

## 2018-08-08 DIAGNOSIS — R102 Pelvic and perineal pain: Secondary | ICD-10-CM | POA: Diagnosis not present

## 2018-08-31 DIAGNOSIS — G8929 Other chronic pain: Secondary | ICD-10-CM | POA: Diagnosis not present

## 2018-08-31 DIAGNOSIS — G894 Chronic pain syndrome: Secondary | ICD-10-CM | POA: Diagnosis not present

## 2018-08-31 DIAGNOSIS — R102 Pelvic and perineal pain: Secondary | ICD-10-CM | POA: Diagnosis not present

## 2018-09-19 ENCOUNTER — Other Ambulatory Visit: Payer: Self-pay | Admitting: Family Medicine

## 2018-09-19 DIAGNOSIS — Z1231 Encounter for screening mammogram for malignant neoplasm of breast: Secondary | ICD-10-CM

## 2018-09-21 DIAGNOSIS — R102 Pelvic and perineal pain: Secondary | ICD-10-CM | POA: Diagnosis not present

## 2018-09-21 DIAGNOSIS — G8929 Other chronic pain: Secondary | ICD-10-CM | POA: Diagnosis not present

## 2018-09-21 DIAGNOSIS — G894 Chronic pain syndrome: Secondary | ICD-10-CM | POA: Diagnosis not present

## 2018-09-25 DIAGNOSIS — Z5181 Encounter for therapeutic drug level monitoring: Secondary | ICD-10-CM | POA: Diagnosis not present

## 2018-09-25 DIAGNOSIS — Z79899 Other long term (current) drug therapy: Secondary | ICD-10-CM | POA: Diagnosis not present

## 2018-10-24 DIAGNOSIS — N3010 Interstitial cystitis (chronic) without hematuria: Secondary | ICD-10-CM | POA: Diagnosis not present

## 2018-10-24 DIAGNOSIS — R102 Pelvic and perineal pain: Secondary | ICD-10-CM | POA: Diagnosis not present

## 2018-10-24 DIAGNOSIS — G894 Chronic pain syndrome: Secondary | ICD-10-CM | POA: Diagnosis not present

## 2018-10-24 DIAGNOSIS — G8929 Other chronic pain: Secondary | ICD-10-CM | POA: Diagnosis not present

## 2018-10-27 ENCOUNTER — Emergency Department (HOSPITAL_COMMUNITY)
Admission: EM | Admit: 2018-10-27 | Discharge: 2018-10-28 | Disposition: A | Discharge status: Home / Self Care | Payer: Medicaid Other | Attending: Emergency Medicine | Admitting: Emergency Medicine

## 2018-10-27 ENCOUNTER — Emergency Department (HOSPITAL_COMMUNITY): Payer: Medicaid Other

## 2018-10-27 ENCOUNTER — Other Ambulatory Visit: Payer: Self-pay

## 2018-10-27 ENCOUNTER — Encounter (HOSPITAL_COMMUNITY): Payer: Self-pay | Admitting: *Deleted

## 2018-10-27 DIAGNOSIS — R42 Dizziness and giddiness: Secondary | ICD-10-CM | POA: Diagnosis not present

## 2018-10-27 DIAGNOSIS — I10 Essential (primary) hypertension: Secondary | ICD-10-CM | POA: Diagnosis not present

## 2018-10-27 DIAGNOSIS — R0602 Shortness of breath: Secondary | ICD-10-CM | POA: Diagnosis not present

## 2018-10-27 DIAGNOSIS — R1011 Right upper quadrant pain: Secondary | ICD-10-CM | POA: Insufficient documentation

## 2018-10-27 DIAGNOSIS — R0789 Other chest pain: Secondary | ICD-10-CM | POA: Insufficient documentation

## 2018-10-27 DIAGNOSIS — Z79899 Other long term (current) drug therapy: Secondary | ICD-10-CM | POA: Insufficient documentation

## 2018-10-27 DIAGNOSIS — R1013 Epigastric pain: Secondary | ICD-10-CM | POA: Diagnosis not present

## 2018-10-27 DIAGNOSIS — R079 Chest pain, unspecified: Secondary | ICD-10-CM | POA: Diagnosis not present

## 2018-10-27 DIAGNOSIS — R52 Pain, unspecified: Secondary | ICD-10-CM

## 2018-10-27 DIAGNOSIS — R110 Nausea: Secondary | ICD-10-CM | POA: Diagnosis not present

## 2018-10-27 LAB — BASIC METABOLIC PANEL
Anion gap: 11 (ref 5–15)
BUN: 16 mg/dL (ref 6–20)
CO2: 22 mmol/L (ref 22–32)
Calcium: 9.5 mg/dL (ref 8.9–10.3)
Chloride: 104 mmol/L (ref 98–111)
Creatinine, Ser: 0.73 mg/dL (ref 0.44–1.00)
GFR calc Af Amer: >60 mL/min (ref >60)
GFR calc non Af Amer: >60 mL/min (ref >60)
Glucose, Bld: 151 mg/dL — ABNORMAL HIGH (ref 70–99)
Potassium: 3.9 mmol/L (ref 3.5–5.1)
Sodium: 137 mmol/L (ref 135–145)

## 2018-10-27 LAB — CBC
HCT: 40.7 % (ref 36.0–46.0)
Hemoglobin: 13.6 g/dL (ref 12.0–15.0)
MCH: 29.6 pg (ref 26.0–34.0)
MCHC: 33.4 g/dL (ref 30.0–36.0)
MCV: 88.7 fL (ref 80.0–100.0)
Platelets: 255 10*3/uL (ref 150–400)
RBC: 4.59 MIL/uL (ref 3.87–5.11)
RDW: 12.2 % (ref 11.5–15.5)
WBC: 11.5 10*3/uL — ABNORMAL HIGH (ref 4.0–10.5)
nRBC: 0.3 % — ABNORMAL HIGH (ref 0.0–0.2)

## 2018-10-27 LAB — TROPONIN I (HIGH SENSITIVITY): Troponin I (High Sensitivity): 4 ng/L (ref <18)

## 2018-10-27 MED ORDER — SODIUM CHLORIDE 0.9% FLUSH
3.0000 mL | Freq: Once | INTRAVENOUS | Status: AC
  Administered 2018-10-28: 3 mL via INTRAVENOUS

## 2018-10-27 NOTE — ED Triage Notes (Signed)
The pt is c/o chest pressure since 1800 with sob nauseated with dizziness.  She was given 4mg  of zofran iv by [facility]   She had 324mg  of aspirin and sl nitro no change in aspirin and nitro

## 2018-10-28 ENCOUNTER — Emergency Department (HOSPITAL_COMMUNITY): Payer: Medicaid Other

## 2018-10-28 ENCOUNTER — Other Ambulatory Visit: Payer: Self-pay

## 2018-10-28 DIAGNOSIS — R1011 Right upper quadrant pain: Secondary | ICD-10-CM | POA: Diagnosis not present

## 2018-10-28 DIAGNOSIS — R110 Nausea: Secondary | ICD-10-CM | POA: Diagnosis not present

## 2018-10-28 LAB — URINALYSIS, ROUTINE W REFLEX MICROSCOPIC
Bacteria, UA: NONE SEEN
Bilirubin Urine: NEGATIVE
Glucose, UA: NEGATIVE mg/dL
Hgb urine dipstick: NEGATIVE
Ketones, ur: 5 mg/dL — AB
Nitrite: NEGATIVE
Protein, ur: NEGATIVE mg/dL
Specific Gravity, Urine: 1.005 (ref 1.005–1.030)
pH: 7 (ref 5.0–8.0)

## 2018-10-28 LAB — HEPATIC FUNCTION PANEL
ALT: 30 U/L (ref 0–44)
AST: 26 U/L (ref 15–41)
Albumin: 3.9 g/dL (ref 3.5–5.0)
Alkaline Phosphatase: 55 U/L (ref 38–126)
Bilirubin, Direct: <0.1 mg/dL (ref 0.0–0.2)
Total Bilirubin: 0.7 mg/dL (ref 0.3–1.2)
Total Protein: 6.6 g/dL (ref 6.5–8.1)

## 2018-10-28 LAB — LIPASE, BLOOD: Lipase: 137 U/L — ABNORMAL HIGH (ref 11–51)

## 2018-10-28 LAB — TROPONIN I (HIGH SENSITIVITY): Troponin I (High Sensitivity): 6 ng/L (ref <18)

## 2018-10-28 MED ORDER — MORPHINE SULFATE (PF) 4 MG/ML IV SOLN
4.0000 mg | Freq: Once | INTRAVENOUS | Status: AC
  Administered 2018-10-28: 08:00:00 4 mg via INTRAVENOUS
  Filled 2018-10-28: qty 1

## 2018-10-28 MED ORDER — MORPHINE SULFATE (PF) 4 MG/ML IV SOLN
4.0000 mg | Freq: Once | INTRAVENOUS | Status: AC
  Administered 2018-10-28: 4 mg via INTRAVENOUS
  Filled 2018-10-28: qty 1

## 2018-10-28 MED ORDER — ONDANSETRON HCL 4 MG/2ML IJ SOLN
4.0000 mg | Freq: Once | INTRAMUSCULAR | Status: AC
  Administered 2018-10-28: 08:00:00 4 mg via INTRAVENOUS
  Filled 2018-10-28: qty 2

## 2018-10-28 MED ORDER — ONDANSETRON 4 MG PO TBDP: 4.0000 mg | ORAL_TABLET | Freq: Three times a day (TID) | ORAL | 0 refills | Status: DC | PRN

## 2018-10-28 MED ORDER — SODIUM CHLORIDE 0.9 % IV BOLUS
1000.0000 mL | Freq: Once | INTRAVENOUS | Status: AC
  Administered 2018-10-28: 1000 mL via INTRAVENOUS

## 2018-10-28 NOTE — ED Notes (Signed)
Pt reports having sudden onset of abdominal pain located RUQ and LUQ right below the rib cage. Tenderness on palpation.

## 2018-10-28 NOTE — ED Provider Notes (Signed)
[facility] EMERGENCY DEPARTMENT Provider Note   CSN: [id number] Arrival date & time: 10/27/18  2201     History   Chief Complaint Chief Complaint  Patient presents with  . Chest Pain    HPI Denise Aguirre is a 58 y.o. female.     Pt presents to the ED today with abdominal and chest pain.  She said it started at 1800 and she had some associated sob and dizziness.  She did call EMS and was given zofran and asa and ntg.  No change in pain with meds.  She went to the waiting room and waited all night.  She said the pain is more in her upper abdomen as opposed to her chest.     Past Medical History:  Diagnosis Date  . Cystitis, interstitial    chronic since 1999  . Diverticulitis   . Other malaise and fatigue   . Screening cholesterol level     Patient Active Problem List   Diagnosis Date Noted  . Visual problems 08/19/2017  . AK (actinic keratosis) 05/31/2017  . Plantar fasciitis 08/04/2016  . Vitamin D deficiency 06/26/2014  . Hyperglycemia 06/26/2014  . Screening cholesterol level 12/13/2012  . Interstitial cystitis 04/14/2006    Past Surgical History:  Procedure Laterality Date  . dental biopsy     right buccal mucosa   . INTERSTIM IMPLANT PLACEMENT     and removal in 2008  . TONSILLECTOMY     1980  . TUBAL LIGATION     1995  . VAGINAL HYSTERECTOMY       OB History   No obstetric history on file.      Home Medications    Prior to Admission medications   Medication Sig Start Date End Date Taking? Authorizing Provider  Calcium Carb-Cholecalciferol 600-200 MG-UNIT TABS Take 2 tablets by mouth daily.    [provider]  cholecalciferol (VITAMIN D) 1000 UNITS tablet Take 1 tablet (1,000 Units total) by mouth daily. 06/27/14   Lind Covert, MD  fluticasone (FLONASE) 50 MCG/ACT nasal spray Place 2 sprays into both nostrils daily. Then reduce to 1 spray daily in both nostrils 04/07/18   Martyn Malay, MD   hydrOXYzine (ATARAX/VISTARIL) 25 MG tablet Take 10 mg by mouth at bedtime.     [provider]  methadone (DOLOPHINE) 5 MG tablet Take 5 mg by mouth every 12 (twelve) hours.    [provider]  Omega-3 Fatty Acids (FISH OIL PO) Take by mouth.    [provider]  ondansetron (ZOFRAN ODT) 4 MG disintegrating tablet Take 1 tablet (4 mg total) by mouth every 8 (eight) hours as needed. 10/28/18   Isla Pence, MD    Family History Family History  Problem Relation Age of Onset  . Stroke Father   . Breast cancer Maternal Grandmother   . Colon cancer Neg Hx     Social History Social History   Tobacco Use  . Smoking status: Former Smoker    Quit date: 11/22/1985    Years since quitting: 32.9  . Smokeless tobacco: Never Used  Substance Use Topics  . Alcohol use: No  . Drug use: No     Allergies   Ivp dye [iodinated diagnostic agents] and Topiramate   Review of Systems Review of Systems  Cardiovascular: Positive for chest pain.  Gastrointestinal: Positive for abdominal pain.  All other systems reviewed and are negative.    Physical Exam Updated Vital Signs BP 114/67  Pulse 63   Temp 98.1 F (36.7 C) (Oral)   Resp 11   Ht 5\' 5"  (1.651 m)   Wt 70.3 kg   LMP 09/06/2013   SpO2 94%   BMI 25.79 kg/m   Physical Exam Vitals signs and nursing note reviewed.  Constitutional:      Appearance: She is well-developed.  HENT:     Head: Normocephalic and atraumatic.  Eyes:     Extraocular Movements: Extraocular movements intact.     Pupils: Pupils are equal, round, and reactive to light.  Neck:     Musculoskeletal: Normal range of motion and neck supple.  Cardiovascular:     Rate and Rhythm: Normal rate and regular rhythm.     Heart sounds: Normal heart sounds.  Pulmonary:     Effort: Pulmonary effort is normal.     Breath sounds: Normal breath sounds.  Abdominal:     General: Bowel sounds are normal.     Palpations: Abdomen is soft.      Tenderness: There is abdominal tenderness in the right upper quadrant.  Musculoskeletal: Normal range of motion.  Skin:    General: Skin is warm.     Capillary Refill: Capillary refill takes less than 2 seconds.  Neurological:     General: No focal deficit present.     Mental Status: She is alert and oriented to person, place, and time.  Psychiatric:        Mood and Affect: Mood normal.        Behavior: Behavior normal.      ED Treatments / Results  Labs (all labs ordered are listed, but only abnormal results are displayed) Labs Reviewed  BASIC METABOLIC PANEL - Abnormal; Notable for the following components:      Result Value   Glucose, Bld 151 (*)    All other components within normal limits  CBC - Abnormal; Notable for the following components:   WBC 11.5 (*)    nRBC 0.3 (*)    All other components within normal limits  LIPASE, BLOOD - Abnormal; Notable for the following components:   Lipase 137 (*)    All other components within normal limits  URINALYSIS, ROUTINE W REFLEX MICROSCOPIC - Abnormal; Notable for the following components:   Color, Urine STRAW (*)    Ketones, ur 5 (*)    Leukocytes,Ua LARGE (*)    All other components within normal limits  HEPATIC FUNCTION PANEL  TROPONIN I (HIGH SENSITIVITY)  TROPONIN I (HIGH SENSITIVITY)    EKG EKG Interpretation  Date/Time:  Friday October 27 2018 22:09:01 EDT Ventricular Rate:  84 PR Interval:  156 QRS Duration: 94 QT Interval:  372 QTC Calculation: 439 R Axis:   3 Text Interpretation:  Normal sinus rhythm Incomplete right bundle branch block Cannot rule out Anterior infarct , age undetermined Abnormal ECG Similar to prior. No STEMI  Confirmed by Nanda Quinton [phone]) on 10/27/2018 10:23:21 PM   Radiology Ct Abdomen Pelvis Wo Contrast  Result Date: 10/28/2018 CLINICAL DATA:  58 year old female with history of chest pressure. Shortness of breath and nausea. EXAM: CT ABDOMEN AND PELVIS WITHOUT CONTRAST  TECHNIQUE: Multidetector CT imaging of the abdomen and pelvis was performed following the standard protocol without IV contrast. COMPARISON:  No priors. FINDINGS: Lower chest: Unremarkable. Hepatobiliary: No suspicious cystic or solid hepatic lesions are confidently identified on today's noncontrast CT examination. Unenhanced appearance of the gallbladder is normal. Pancreas: No definite pancreatic mass or peripancreatic fluid collections or inflammatory changes are  noted on today's noncontrast CT examination. Spleen: Unremarkable. Adrenals/Urinary Tract: There are no abnormal calcifications within the collecting system of either kidney, along the course of either ureter, or within the lumen of the urinary bladder. No hydroureteronephrosis or perinephric stranding to suggest urinary tract obstruction at this time. The unenhanced appearance of the kidneys is unremarkable bilaterally. Unenhanced appearance of the urinary bladder is normal. Stomach/Bowel: Unenhanced appearance of the stomach is normal. No pathologic dilatation of small bowel or colon. Normal appendix. Vascular/Lymphatic: No atherosclerotic calcifications in the abdominal aorta or pelvic vasculature. No lymphadenopathy noted in the abdomen or pelvis. Reproductive: Status post hysterectomy. Ovaries are not confidently identified and may be surgically absent or atrophic. Other: No significant volume of ascites.  No pneumoperitoneum. Musculoskeletal: There are no aggressive appearing lytic or blastic lesions noted in the visualized portions of the skeleton. IMPRESSION: 1. No acute findings are noted in the abdomen or pelvis to account for the patient's symptoms. 2. Normal appendix. Electronically Signed   By: Vinnie Langton M.D.   On: 10/28/2018 10:39   Dg Chest 2 View  Result Date: 10/27/2018 CLINICAL DATA:  Shortness of breath, nausea, chest pain and dizziness EXAM: CHEST - 2 VIEW COMPARISON:  Radiograph August 15, 2007 FINDINGS: Streaky basilar  atelectatic changes. No consolidation, features of edema, pneumothorax, or effusion. Pulmonary vascularity is normally distributed. The cardiomediastinal contours are unremarkable. No acute osseous or soft tissue abnormality. Degenerative changes are present in the imaged spine and shoulders. IMPRESSION: No acute cardiopulmonary abnormality Electronically Signed   By: Lovena Le M.D.   On: 10/27/2018 23:42   US Abdomen Limited Ruq  Result Date: 10/28/2018 CLINICAL DATA:  58 year old female with history of right upper quadrant abdominal pain since yesterday. EXAM: ULTRASOUND ABDOMEN LIMITED RIGHT UPPER QUADRANT COMPARISON:  No priors. FINDINGS: Gallbladder: No gallstones or wall thickening visualized. No sonographic Murphy sign noted by sonographer. Common bile duct: Diameter: 2.5 mm Liver: No focal lesion identified. Within normal limits in parenchymal echogenicity. Portal vein is patent on color Doppler imaging with normal direction of blood flow towards the liver. Other: Tiny nonshadowing echogenic focus in the right kidney in junction of upper and interpolar region measuring 5 mm. IMPRESSION: 1. No acute findings to account for the patient's symptoms. Specifically, no cholelithiasis or findings to suggest an acute cholecystitis. 2. 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 Electronically Signed   By: Vinnie Langton M.D.   On: 10/28/2018 08:43    Procedures Procedures (including critical care time)  Medications Ordered in ED Medications  sodium chloride flush (NS) 0.9 % injection 3 mL (3 mLs Intravenous Given 10/28/18 0749)  morphine 4 MG/ML injection 4 mg (4 mg Intravenous Given 10/28/18 0745)  ondansetron (ZOFRAN) injection 4 mg (4 mg Intravenous Given 10/28/18 0745)  sodium chloride 0.9 % bolus 1,000 mL (1,000 mLs Intravenous New Bag/Given 10/28/18 0749)  morphine 4 MG/ML injection 4 mg (4 mg Intravenous Given 10/28/18  0937)     Initial Impression / Assessment and Plan / ED Course  I have reviewed the triage vital signs and the nursing notes.  Pertinent labs & imaging results that were available during my care of the patient were reviewed by me and considered in my medical decision making (see chart for details).    Pt is feeling much better.  No definite etiology of her pain, although she may have biliary dyskinesia.  Pt knows to avoid fatty and greasy foods  and to f/u with her pcp.  Return if worse.  Final Clinical Impressions(s) / ED Diagnoses   Final diagnoses:  Pain  Atypical chest pain  Right upper quadrant abdominal pain    ED Discharge Orders         Ordered    ondansetron (ZOFRAN ODT) 4 MG disintegrating tablet  Every 8 hours PRN     10/28/18 1050           Isla Pence, MD 10/28/18 1052

## 2018-10-28 NOTE — ED Notes (Signed)
Patient transported to Ultrasound 

## 2018-10-28 NOTE — Discharge Instructions (Addendum)
Avoid fatty and greasy foods.  If symptoms persist, your pcp may want to order a HIDA scan.

## 2018-10-30 ENCOUNTER — Telehealth: Payer: Self-pay | Admitting: Family Medicine

## 2018-10-30 ENCOUNTER — Ambulatory Visit (INDEPENDENT_AMBULATORY_CARE_PROVIDER_SITE_OTHER): Payer: Medicaid Other | Admitting: Family Medicine

## 2018-10-30 ENCOUNTER — Other Ambulatory Visit: Payer: Self-pay

## 2018-10-30 VITALS — BP 120/70 | HR 69

## 2018-10-30 DIAGNOSIS — R399 Unspecified symptoms and signs involving the genitourinary system: Secondary | ICD-10-CM | POA: Diagnosis not present

## 2018-10-30 DIAGNOSIS — R1011 Right upper quadrant pain: Secondary | ICD-10-CM

## 2018-10-30 LAB — POCT URINALYSIS DIP (MANUAL ENTRY)
Bilirubin, UA: NEGATIVE
Blood, UA: NEGATIVE
Glucose, UA: NEGATIVE mg/dL
Ketones, POC UA: NEGATIVE mg/dL
Nitrite, UA: NEGATIVE
Protein Ur, POC: NEGATIVE mg/dL
Spec Grav, UA: 1.025 (ref 1.010–1.025)
Urobilinogen, UA: 0.2 E.U./dL
pH, UA: 5.5 (ref 5.0–8.0)

## 2018-10-30 LAB — POCT URINE PREGNANCY: Preg Test, Ur: NEGATIVE

## 2018-10-30 NOTE — Progress Notes (Signed)
Subjective: Chief Complaint  Patient presents with  . UTI symptoms    HPI: Denise Aguirre is a 58 y.o. presenting to clinic today to discuss the following:  ED Follow up/concern for UTI Patient was seen in the ED due to concern for UTI. She has a history of interstitial cystitis but states her pain has been worse than usual and felt different. Her work up in the ED was negative for a UTI. She states her pain is located in the RUQ and radiates to the LUQ and has never had this before. It is not associated with eating or with meals. She is nauseated but no vomiting, fever, chills, or diarrhea. No burning or pain with urination, no increase in urinary frequency, no urinary urgency above her baseline. She is tolerating liquids and having difficulty with appetite.  ROS noted in HPI.    Social History   Tobacco Use  Smoking Status Former Smoker  . Quit date: 11/22/1985  . Years since quitting: 32.9  Smokeless Tobacco Never Used   Objective: BP 120/70   Pulse 69   LMP 09/06/2013   SpO2 98%  Vitals and nursing notes reviewed  Physical Exam Gen: Alert and Oriented x 3, NAD CV: RRR, no murmurs, normal S1, S2 split Resp: CTAB, no wheezing, rales, or rhonchi, comfortable work of breathing Abd: non-distended, tender to palpation in the RUQ, no guarding, no rebound tenderness, negative Murphy sign, soft, +bs in all four quadrants Ext: no clubbing, cyanosis, or edema Skin: warm, dry, intact, no rashes  Results for orders placed or performed during the hospital encounter of 10/27/18 (from the past 72 hour(s))  Basic metabolic panel     Status: Abnormal   Collection Time: 10/27/18 10:20 PM  Result Value Ref Range   Sodium 137 135 - 145 mmol/L   Potassium 3.9 3.5 - 5.1 mmol/L   Chloride 104 98 - 111 mmol/L   CO2 22 22 - 32 mmol/L   Glucose, Bld 151 (H) 70 - 99 mg/dL   BUN 16 6 - 20 mg/dL   Creatinine, Ser 0.73 0.44 - 1.00 mg/dL   Calcium 9.5 8.9 - 10.3 mg/dL   GFR calc non  Af Amer >60 >60 mL/min   GFR calc Af Amer >60 >60 mL/min   Anion gap 11 5 - 15    Comment: Performed at [facility], [place] [address], [place], [place] [address]  CBC     Status: Abnormal   Collection Time: 10/27/18 10:20 PM  Result Value Ref Range   WBC 11.5 (H) 4.0 - 10.5 K/uL   RBC 4.59 3.87 - 5.11 MIL/uL   Hemoglobin 13.6 12.0 - 15.0 g/dL   HCT 40.7 36.0 - 46.0 %   MCV 88.7 80.0 - 100.0 fL   MCH 29.6 26.0 - 34.0 pg   MCHC 33.4 30.0 - 36.0 g/dL   RDW 12.2 11.5 - 15.5 %   Platelets 255 150 - 400 K/uL   nRBC 0.3 (H) 0.0 - 0.2 %    Comment: Performed at [facility] [address], [place], [place] [address]  Troponin I (High Sensitivity)     Status: None   Collection Time: 10/27/18 10:20 PM  Result Value Ref Range   Troponin I (High Sensitivity) 4 <18 ng/L    Comment: (NOTE) Elevated high sensitivity troponin I (hsTnI) values and significant  changes across serial measurements may suggest ACS but many other  chronic and acute conditions are  known to elevate hsTnI results.  Refer to the "Links" section for chest pain algorithms and additional  guidance. Performed at [facility], [place] [address], [place], [place] [address]   Troponin I (High Sensitivity)     Status: None   Collection Time: 10/28/18  1:12 AM  Result Value Ref Range   Troponin I (High Sensitivity) 6 <18 ng/L    Comment: (NOTE) Elevated high sensitivity troponin I (hsTnI) values and significant  changes across serial measurements may suggest ACS but many other  chronic and acute conditions are known to elevate hsTnI results.  Refer to the "Links" section for chest pain algorithms and additional  guidance. Performed at [facility], [place] [address] [place], [place] [address]   Urinalysis, Routine w reflex microscopic     Status: Abnormal   Collection Time: 10/28/18  8:41 AM  Result Value Ref Range   Color, Urine STRAW (A) YELLOW   APPearance CLEAR CLEAR   Specific Gravity, Urine  1.005 1.005 - 1.030   pH 7.0 5.0 - 8.0   Glucose, UA NEGATIVE NEGATIVE mg/dL   Hgb urine dipstick NEGATIVE NEGATIVE   Bilirubin Urine NEGATIVE NEGATIVE   Ketones, ur 5 (A) NEGATIVE mg/dL   Protein, ur NEGATIVE NEGATIVE mg/dL   Nitrite NEGATIVE NEGATIVE   Leukocytes,Ua LARGE (A) NEGATIVE   RBC / HPF 0-5 0 - 5 RBC/hpf   WBC, UA 6-10 0 - 5 WBC/hpf   Bacteria, UA NONE SEEN NONE SEEN   Squamous Epithelial / LPF 0-5 0 - 5    Comment: Performed at [facility] [address], [place], [place] [address]  Lipase, blood     Status: Abnormal   Collection Time: 10/28/18  8:48 AM  Result Value Ref Range   Lipase 137 (H) 11 - 51 U/L    Comment: Performed at [facility], [place] [address] [place], [place] [address]  Hepatic function panel     Status: None   Collection Time: 10/28/18  8:57 AM  Result Value Ref Range   Total Protein 6.6 6.5 - 8.1 g/dL   Albumin 3.9 3.5 - 5.0 g/dL   AST 26 15 - 41 U/L   ALT 30 0 - 44 U/L   Alkaline Phosphatase 55 38 - 126 U/L   Total Bilirubin 0.7 0.3 - 1.2 mg/dL   Bilirubin, Direct <0.1 0.0 - 0.2 mg/dL   Indirect Bilirubin NOT CALCULATED 0.3 - 0.9 mg/dL    Comment: Performed at [facility] [address] [place], [place] [address]    Assessment/Plan:  RUQ pain New onset RUQ pain. DDx includes biliary dyskinesia vs cholelithasis vs cholecystitis vs gall bladder inflammation not from a stone vs UTI. - repeat U/A and reflex Urine Cx in clinic negative for UTI - Work up in ED included RUQ U/S and CT Abd Pelvis showed no gall stones, no gall bladder wall inflammation. - RUQ U/S with HIDA scan to look at gallbladder ejection fraction to rule out biliary dyskinesia  PATIENT EDUCATION PROVIDED: See AVS    Diagnosis and plan along with any newly prescribed medication(s) were discussed in detail with this patient today. The patient verbalized understanding and agreed with the plan. Patient advised if symptoms worsen return to clinic or ER.     Orders Placed This Encounter  Procedures  . Urine Culture  . NM Hepato W/EjeCT Fract    Standing Status:   Future    Standing Expiration Date:  11/29/2018    Order Specific Question:   If indicated for the ordered procedure, I authorize the administration of a radiopharmaceutical per Radiology protocol    Answer:   Yes    Order Specific Question:   Is the patient pregnant?    Answer:   No    Order Specific Question:   Preferred imaging location?    Answer:   [facility]    Order Specific Question:   Radiology Contrast Protocol - do NOT remove file path    Answer:   \\[email or web address]  . NM Hepato W/EjeCT Fract    Standing Status:   Future    Standing Expiration Date:   12/30/2019    Order Specific Question:   If indicated for the ordered procedure, I authorize the administration of a radiopharmaceutical per Radiology protocol    Answer:   Yes    Order Specific Question:   Is the patient pregnant?    Answer:   No    Order Specific Question:   Preferred imaging location?    Answer:   [facility]    Order Specific Question:   Radiology Contrast Protocol - do NOT remove file path    Answer:   \\[email or web address]  . POCT urinalysis dipstick  . POCT urine pregnancy    No orders of the defined types were placed in this encounter.    Harolyn Rutherford, DO 10/30/2018, 2:52 PM PGY-3 [place]

## 2018-10-30 NOTE — Patient Instructions (Addendum)
It was great to meet you today! Thank you for letting me participate in your care!  Today, we discussed your right upper quadrant pain. I am ordering a test to see if your gallbladder is functioning properly and will be in touch with results.   Please let me know if you need additional medication. Please seek immediate care if you develop fever, intractable vomiting, and/or low blood pressure.  Be well, Harolyn Rutherford, DO PGY-3, [facility]

## 2018-10-30 NOTE — Telephone Encounter (Signed)
Patient needs for [place] to contact her insurance company to try and get her HIDA test done sooner than this Friday (her appt now).  If we can approve this for her, then we can get it done sooner (through her medicaid).  Please call her back asap to discuss, [phone].

## 2018-10-31 DIAGNOSIS — G8929 Other chronic pain: Secondary | ICD-10-CM | POA: Diagnosis not present

## 2018-10-31 DIAGNOSIS — R102 Pelvic and perineal pain: Secondary | ICD-10-CM | POA: Diagnosis not present

## 2018-10-31 DIAGNOSIS — N3010 Interstitial cystitis (chronic) without hematuria: Secondary | ICD-10-CM | POA: Diagnosis not present

## 2018-10-31 DIAGNOSIS — G894 Chronic pain syndrome: Secondary | ICD-10-CM | POA: Diagnosis not present

## 2018-11-01 ENCOUNTER — Telehealth: Payer: Self-pay | Admitting: Family Medicine

## 2018-11-01 DIAGNOSIS — R1011 Right upper quadrant pain: Secondary | ICD-10-CM | POA: Insufficient documentation

## 2018-11-01 LAB — URINE CULTURE

## 2018-11-01 NOTE — Assessment & Plan Note (Signed)
New onset RUQ pain. DDx includes biliary dyskinesia vs cholelithasis vs cholecystitis vs gall bladder inflammation not from a stone vs UTI. - repeat U/A and reflex Urine Cx in clinic negative for UTI - Work up in ED included RUQ U/S and CT Abd Pelvis showed no gall stones, no gall bladder wall inflammation. - RUQ U/S with HIDA scan to look at gallbladder ejection fraction to rule out biliary dyskinesia

## 2018-11-01 NOTE — Telephone Encounter (Signed)
Pt would would like for someone to call her with the results from her last appointment 09/14.

## 2018-11-02 NOTE — Telephone Encounter (Signed)
For hida tomorrow   Still having RUQ pain.   No nausea and vomiting or fever   Recommend trial of otc omeprazole and light foods and add tylenol  She would like to know results of Hida asap  I related I would call tomorrow or Sat

## 2018-11-02 NOTE — Telephone Encounter (Signed)
Pt calling to check status since she is still having pain . Christen Bame, CMA

## 2018-11-03 ENCOUNTER — Encounter: Payer: Self-pay | Admitting: Family Medicine

## 2018-11-03 ENCOUNTER — Encounter (HOSPITAL_COMMUNITY)
Admission: RE | Admit: 2018-11-03 | Discharge: 2018-11-03 | Disposition: A | Discharge status: Home / Self Care | Payer: Medicaid Other | Source: Ambulatory Visit | Attending: Family Medicine | Admitting: Family Medicine

## 2018-11-03 ENCOUNTER — Other Ambulatory Visit: Payer: Self-pay

## 2018-11-03 DIAGNOSIS — R1011 Right upper quadrant pain: Secondary | ICD-10-CM | POA: Diagnosis not present

## 2018-11-03 MED ORDER — TECHNETIUM TC 99M MEBROFENIN IV KIT
5.3000 | PACK | Freq: Once | INTRAVENOUS | Status: AC | PRN
  Administered 2018-11-03: 5.3 via INTRAVENOUS

## 2018-11-03 NOTE — Telephone Encounter (Signed)
Pt called to check for results of test performed @ 1:30 today.   Advised that PCP has not reviewed them yet and that he documented he would call today or tomorrow. Christen Bame, CMA

## 2018-11-04 ENCOUNTER — Encounter: Payer: Self-pay | Admitting: Family Medicine

## 2018-11-06 ENCOUNTER — Other Ambulatory Visit: Payer: Self-pay | Admitting: Family Medicine

## 2018-11-06 DIAGNOSIS — R1011 Right upper quadrant pain: Secondary | ICD-10-CM

## 2018-11-06 NOTE — Progress Notes (Signed)
Called patient and gave her results over the phone about the HIDA scan, which showed normal ejection fraction. Patient is still in significant pain that is not worse than when I saw her but not better.  Putting in urgent referral to GI. Did not feel like repeating a Lipase would help [place] clinically as she is still tolerating liquids and soft foods. May consider bowel rest if no improvement in another few days.

## 2018-11-07 ENCOUNTER — Telehealth: Payer: Self-pay | Admitting: *Deleted

## 2018-11-07 NOTE — Telephone Encounter (Signed)
Pt informed.  Denise Aguirre, CMA  

## 2018-11-07 NOTE — Telephone Encounter (Signed)
Pt is very anxious about the urine culture.  She would like to know the results. Christen Bame, CMA

## 2018-11-08 ENCOUNTER — Other Ambulatory Visit: Payer: Self-pay

## 2018-11-08 ENCOUNTER — Ambulatory Visit
Admission: RE | Admit: 2018-11-08 | Discharge: 2018-11-08 | Disposition: A | Discharge status: Home / Self Care | Payer: Medicaid Other | Source: Ambulatory Visit | Attending: Family Medicine | Admitting: Family Medicine

## 2018-11-08 ENCOUNTER — Encounter (HOSPITAL_COMMUNITY): Payer: Medicaid Other

## 2018-11-08 DIAGNOSIS — Z1231 Encounter for screening mammogram for malignant neoplasm of breast: Secondary | ICD-10-CM

## 2018-11-09 ENCOUNTER — Encounter: Payer: Self-pay | Admitting: Family Medicine

## 2018-11-13 ENCOUNTER — Encounter: Payer: Self-pay | Admitting: Physician Assistant

## 2018-11-13 ENCOUNTER — Other Ambulatory Visit: Payer: Self-pay

## 2018-11-13 ENCOUNTER — Ambulatory Visit: Payer: Medicaid Other | Admitting: Physician Assistant

## 2018-11-13 VITALS — BP 120/70 | HR 72 | Temp 96.8°F | Ht 65.0 in | Wt 155.9 lb

## 2018-11-13 DIAGNOSIS — R1013 Epigastric pain: Secondary | ICD-10-CM | POA: Diagnosis not present

## 2018-11-13 DIAGNOSIS — K219 Gastro-esophageal reflux disease without esophagitis: Secondary | ICD-10-CM | POA: Diagnosis not present

## 2018-11-13 DIAGNOSIS — R110 Nausea: Secondary | ICD-10-CM | POA: Diagnosis not present

## 2018-11-13 MED ORDER — PANTOPRAZOLE SODIUM 40 MG PO TBEC: 40.0000 mg | DELAYED_RELEASE_TABLET | Freq: Two times a day (BID) | ORAL | 2 refills | Status: DC

## 2018-11-13 MED ORDER — AMBULATORY NON FORMULARY MEDICATION: 0 refills | Status: DC

## 2018-11-13 NOTE — Progress Notes (Signed)
Reviewed and agree with management plan.  Min Tunnell T. Shatona Andujar, MD FACG [facility]  

## 2018-11-13 NOTE — Progress Notes (Signed)
Chief Complaint: Right upper quadrant abdominal pain with nausea  HPI:    [name] is a 58 year old Caucasian female with a past medical history as listed below including interstitial cystitis and chronic pelvic pain, known to Dr. Fuller Plan, who was referred to me by Lind Covert, * for a complaint of right upper quadrant abdominal pain with nausea.      05/20/2015 colonoscopy with a 7 mm polyp, repeat recommended in 5 years with more extensive bowel prep.    10/27/2018 ED visit for chest pain.  CBC showed a white count 11.5, lipase 137, other labs were within normal range.  CT abdomen pelvis with contrast showed no acute findings in the abdomen or pelvis to account for symptoms.  Normal appendix.  Right upper quadrant ultrasound with no acute findings for symptoms, specifically no cholelithiasis.  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    11/03/2018 HIDA scan negative.    Today, the patient tells me that she has had severe epigastric "gnawing" pain that originally started on her right side and now has radiated over to her left/middle of her stomach.  This has started to hinder her eating schedule because she just "cannot eat anything".  She has been radiating towards mashed potatoes and soft/bland foods.  Previously mostly eating salads.  This pain is worse on an empty stomach.  Rates it as an 8/10 at its worst.  It is now keeping her awake/waking her up at night.  Tells me that she is on Methadone given her chronic pelvic pain due to interstitial cystitis and even this "does not touch it".  Has been on Pantoprazole 40 mg daily over the past 3 weeks as well as Famotidine 20 mg twice daily but still continues with this pain.  Associated with nausea.  No vomiting.    Social history positive for 1 of her children being a resident, sounds like in anesthesiology and another in medical school.    Patient denies fever,  chills, weight loss, anorexia, change in bowel habits or blood in her stool.  Past Medical History:  Diagnosis Date  . Cystitis, interstitial    chronic since 1999  . Diverticulitis   . Other malaise and fatigue   . Screening cholesterol level     Past Surgical History:  Procedure Laterality Date  . dental biopsy     right buccal mucosa   . INTERSTIM IMPLANT PLACEMENT     and removal in 2008  . TONSILLECTOMY     1980  . TUBAL LIGATION     1995  . VAGINAL HYSTERECTOMY    . WRIST FRACTURE SURGERY Right     Current Outpatient Medications  Medication Sig Dispense Refill  . Calcium Carb-Cholecalciferol 600-200 MG-UNIT TABS Take 2 tablets by mouth daily.    Mariane Baumgarten Calcium (STOOL SOFTENER PO) Take 2 capsules by mouth at bedtime.    . methadone (DOLOPHINE) 5 MG tablet Take 5 mg by mouth 2 (two) times daily.    . Omega-3 Fatty Acids (FISH OIL PO) Take 2 capsules by mouth daily.     . ondansetron (ZOFRAN ODT) 4 MG disintegrating tablet Take 1 tablet (4 mg total) by mouth every 8 (eight) hours as needed. 10 tablet 0   No current facility-administered medications for this visit.     Allergies as of 11/13/2018 - Review Complete 11/13/2018  Allergen Reaction Noted  . Ivp dye [iodinated diagnostic agents] Rash  12/21/2010  . Topiramate Nausea And Vomiting 06/19/2015    Family History  Problem Relation Age of Onset  . Stroke Father   . Breast cancer Maternal Grandmother   . Colon cancer Neg Hx     Social History   Socioeconomic History  . Marital status: Married    Spouse name: Not on file  . Number of children: Not on file  . Years of education: Not on file  . Highest education level: Not on file  Occupational History  . Not on file  Social Needs  . Financial resource strain: Not on file  . Food insecurity    Worry: Not on file    Inability: Not on file  . Transportation needs    Medical: Not on file    Non-medical: Not on file  Tobacco Use  . Smoking status:  Former Smoker    Quit date: 11/22/1985    Years since quitting: 32.9  . Smokeless tobacco: Never Used  Substance and Sexual Activity  . Alcohol use: No  . Drug use: No  . Sexual activity: Not on file  Lifestyle  . Physical activity    Days per week: Not on file    Minutes per session: Not on file  . Stress: Not on file  Relationships  . Social Herbalist on phone: Not on file    Gets together: Not on file    Attends religious service: Not on file    Active member of club or organization: Not on file    Attends meetings of clubs or organizations: Not on file    Relationship status: Not on file  . Intimate partner violence    Fear of current or ex partner: Not on file    Emotionally abused: Not on file    Physically abused: Not on file    Forced sexual activity: Not on file  Other Topics Concern  . Not on file  Social History Narrative  . Not on file    Review of Systems:    Constitutional: No weight loss, fever or chills Skin: No rash  Cardiovascular: No chest pain Respiratory: No SOB  Gastrointestinal: See HPI and otherwise negative Genitourinary: No dysuria  Neurological: No headache Musculoskeletal: No new muscle or joint pain Hematologic: No bleeding  Psychiatric: No history of depression or anxiety   Physical Exam:  Vital signs: BP 120/70   Pulse 72   Temp (!) 96.8 F (36 C)   Ht 5\' 5"  (1.651 m)   Wt 155 lb 14.4 oz (70.7 kg)   LMP 09/06/2013   BMI 25.94 kg/m   Constitutional:   Pleasant Caucasian female appears to be in NAD, Well developed, Well nourished, alert and cooperative Head:  Normocephalic and atraumatic. Eyes:   PEERL, EOMI. No icterus. Conjunctiva pink. Ears:  Normal auditory acuity. Neck:  Supple Throat: Oral cavity and pharynx without inflammation, swelling or lesion.  Respiratory: Respirations even and unlabored. Lungs clear to auscultation bilaterally.   No wheezes, crackles, or rhonchi.  Cardiovascular: Normal S1, S2. No MRG.  Regular rate and rhythm. No peripheral edema, cyanosis or pallor.  Gastrointestinal:  Soft, nondistended,marked epigastric ttp with involuntary guarding, Normal bowel sounds. No appreciable masses or hepatomegaly. Rectal:  Not performed.  Msk:  Symmetrical without gross deformities. Without edema, no deformity or joint abnormality.  Neurologic:  Alert and  oriented x4;  grossly normal neurologically.  Skin:   Dry and intact without significant lesions or rashes. Psychiatric:  Demonstrates  good judgement and reason without abnormal affect or behaviors.  RELEVANT LABS AND IMAGING: CBC    Component Value Date/Time   WBC 11.5 (H) 10/27/2018 2220   RBC 4.59 10/27/2018 2220   HGB 13.6 10/27/2018 2220   HGB 13.2 11/16/2017 1014   HCT 40.7 10/27/2018 2220   HCT 40.4 11/16/2017 1014   PLT 255 10/27/2018 2220   PLT 226 11/16/2017 1014   MCV 88.7 10/27/2018 2220   MCV 87 11/16/2017 1014   MCH 29.6 10/27/2018 2220   MCHC 33.4 10/27/2018 2220   RDW 12.2 10/27/2018 2220   RDW 12.9 11/16/2017 1014   LYMPHSABS 2.6 08/15/2007 2050   MONOABS 0.9 08/15/2007 2050   EOSABS 0.0 08/15/2007 2050   BASOSABS 0.1 08/15/2007 2050    CMP     Component Value Date/Time   NA 137 10/27/2018 2220   K 3.9 10/27/2018 2220   CL 104 10/27/2018 2220   CO2 22 10/27/2018 2220   GLUCOSE 151 (H) 10/27/2018 2220   BUN 16 10/27/2018 2220   CREATININE 0.73 10/27/2018 2220   CREATININE 0.65 12/13/2012 1208   CALCIUM 9.5 10/27/2018 2220   PROT 6.6 10/28/2018 0857   ALBUMIN 3.9 10/28/2018 0857   AST 26 10/28/2018 0857   ALT 30 10/28/2018 0857   ALKPHOS 55 10/28/2018 0857   BILITOT 0.7 10/28/2018 0857   GFRNONAA >60 10/27/2018 2220   GFRAA >60 10/27/2018 2220    Assessment: 1.  Epigastric pain: Full evaluation of gallbladder after ER visit 10/27/2018, normal right upper quadrant ultrasound and normal HIDA scan, labs normal, continues epigastric pain regardless of Pantoprazole 40 mg daily and Pepcid 20 mg twice  daily; consider PUD+/-H. pylori versus gastritis 2.  Nausea 3.  GERD  Plan: 1.  Scheduled patient for an EGD with Dr. Fuller Plan in the [facility].  Patient requested next soonest possible appointment.  Discussed risks, benefits, limitations and alternatives. 2.  Increase Pantoprazole to 40 mg twice daily, 30-60 minutes before breakfast and dinner #60 with 3 refills 3.  Continue Famotidine 20 mg twice daily 4.  Prescribed GI cocktail 5-10 mls as needed for epigastric pain. 5.  Reviewed antireflux diet and lifestyle modifications. 6.  Patient to follow in clinic per recommendations from Dr. Fuller Plan after time of procedure.  Ellouise Newer, PA-C [facility] 11/13/2018, 9:40 AM  Cc: Lind Covert, *

## 2018-11-13 NOTE — Patient Instructions (Addendum)
If you are age 58 or older, your body mass index should be between 23-30. Your Body mass index is 25.94 kg/m. If this is out of the aforementioned range listed, please consider follow up with your Primary Care Provider.  If you are age 42 or younger, your body mass index should be between 19-25. Your Body mass index is 25.94 kg/m. If this is out of the aformentioned range listed, please consider follow up with your Primary Care Provider.   You have been scheduled for an endoscopy. Please follow written instructions given to you at your visit today. If you use inhalers (even only as needed), please bring them with you on the day of your procedure.  Increase Pantoprazole to twice daily.    We have sent the following medications to your pharmacy for you to pick up at your convenience: Pantoprazole   We have sent a prescription for GI cocktail  to [facility].  [facility]'s information is below: Address: [address], [place], [place] [address]  [phone]  *Please DO NOT go directly from our office to pick up this medication! Give the pharmacy 1 day to process the prescription as this is compounded and takes time to make.   Thank you for choosing me and [facility].  Dennison Bulla

## 2018-11-14 ENCOUNTER — Encounter: Payer: Self-pay | Admitting: Gastroenterology

## 2018-11-14 ENCOUNTER — Other Ambulatory Visit: Payer: Self-pay

## 2018-11-14 ENCOUNTER — Ambulatory Visit (AMBULATORY_SURGERY_CENTER): Payer: Medicaid Other | Admitting: Gastroenterology

## 2018-11-14 VITALS — BP 114/66 | HR 69 | Temp 98.1°F | Resp 12 | Ht 65.0 in | Wt 155.0 lb

## 2018-11-14 DIAGNOSIS — R1013 Epigastric pain: Secondary | ICD-10-CM | POA: Diagnosis not present

## 2018-11-14 DIAGNOSIS — K3189 Other diseases of stomach and duodenum: Secondary | ICD-10-CM

## 2018-11-14 DIAGNOSIS — K317 Polyp of stomach and duodenum: Secondary | ICD-10-CM

## 2018-11-14 DIAGNOSIS — K2970 Gastritis, unspecified, without bleeding: Secondary | ICD-10-CM

## 2018-11-14 DIAGNOSIS — K229 Disease of esophagus, unspecified: Secondary | ICD-10-CM

## 2018-11-14 DIAGNOSIS — R1011 Right upper quadrant pain: Secondary | ICD-10-CM

## 2018-11-14 DIAGNOSIS — K208 Other esophagitis: Secondary | ICD-10-CM | POA: Diagnosis not present

## 2018-11-14 DIAGNOSIS — K219 Gastro-esophageal reflux disease without esophagitis: Secondary | ICD-10-CM | POA: Diagnosis not present

## 2018-11-14 MED ORDER — SODIUM CHLORIDE 0.9 % IV SOLN: 500.0000 mL | Freq: Once | INTRAVENOUS | Status: DC

## 2018-11-14 NOTE — Progress Notes (Signed)
Report given to PACU, vss 

## 2018-11-14 NOTE — Op Note (Signed)
[place] Patient Name: Denise Aguirre Procedure Date: 11/14/2018 3:32 PM MRN: [id number] Endoscopist: Ladene Artist , MD Age: 58 Referring MD:  Date of Birth: 1960/08/28 Gender: Female Account #: [id number] Procedure:                Upper GI endoscopy Indications:              Epigastric abdominal pain, Abdominal pain in the                            right upper quadrant Medicines:                Monitored Anesthesia Care Procedure:                Pre-Anesthesia Assessment:                           - Prior to the procedure, a History and Physical                            was performed, and patient medications and                            allergies were reviewed. The patient's tolerance of                            previous anesthesia was also reviewed. The risks                            and benefits of the procedure and the sedation                            options and risks were discussed with the patient.                            All questions were answered, and informed consent                            was obtained. Prior Anticoagulants: The patient has                            taken no previous anticoagulant or antiplatelet                            agents. ASA Grade Assessment: II - A patient with                            mild systemic disease. After reviewing the risks                            and benefits, the patient was deemed in                            satisfactory condition to undergo the procedure.  After obtaining informed consent, the endoscope was                            passed under direct vision. Throughout the                            procedure, the patient's blood pressure, pulse, and                            oxygen saturations were monitored continuously. The                            Endoscope was introduced through the mouth, and                            advanced to the second part of  duodenum. The upper                            GI endoscopy was accomplished without difficulty.                            The patient tolerated the procedure well. Scope In: Scope Out: Findings:                 The Z-line was variable and was found 40 cm from                            the incisors. Biopsies were taken with a cold                            forceps for histology.                           The exam of the esophagus was otherwise normal.                           A single localized, 3 mm non-bleeding erosion was                            found in the prepyloric region of the stomach.                            There were no stigmata of recent bleeding. Biopsies                            were taken with a cold forceps for histology.                           Patchy mildly erythematous mucosa without bleeding                            was found in the gastric antrum. Biopsies were  taken with a cold forceps for histology. Biopsies                            were taken with a cold forceps for histology.                           Multiple 3 to 5 mm sessile polyps with no bleeding                            and no stigmata of recent bleeding were found in                            the gastric fundus and in the gastric body.                            Biopsies were taken with a cold forceps for                            histology.                           The exam of the stomach was otherwise normal.                           Patchy mildly erythematous mucosa without active                            bleeding and with no stigmata of bleeding was found                            in the duodenal bulb.                           The exam of the duodenum was otherwise normal. Complications:            No immediate complications. Estimated Blood Loss:     Estimated blood loss was minimal. Impression:               - Z-line variable, 40 cm from the  incisors.                            Biopsied.                           - Non-bleeding erosive gastropathy. Biopsied.                           - Erythematous mucosa in the antrum. Biopsied.                           - Multiple gastric polyps. Biopsied.                           - Erythematous duodenopathy. Recommendation:           - Patient has a contact number  available for                            emergencies. The signs and symptoms of potential                            delayed complications were discussed with the                            patient. Return to normal activities tomorrow.                            Written discharge instructions were provided to the                            patient.                           - Resume previous diet.                           - Follow antireflux measures.                           - Continue present medications including                            pantoprazole 40 mg po bid and GI cocktail 5-13mL                            qid prn dyspepsia.                           - No aspirin, ibuprofen, naproxen, or other                            non-steroidal anti-inflammatory drugs.                           - Await pathology results.                           - Minimal findings on EGD might not be causing her                            epigastric/RUQ pain. Further evaluation per her PCP                            and Pain Medicine service. Ladene Artist, MD 11/14/2018 4:01:13 PM This report has been signed electronically.

## 2018-11-14 NOTE — Patient Instructions (Addendum)
Thank you for letting us take care of your healthcare needs today. PLease see handouts given to you on Gastritis. Continue on Protonix twice a day before breakfast and dinner and take the GI cocktail as needed when symptoms flare up. Await pathology results.      YOU HAD AN ENDOSCOPIC PROCEDURE TODAY AT [facility]:   Refer to the procedure report that was given to you for any specific questions about what was found during the examination.  If the procedure report does not answer your questions, please call your gastroenterologist to clarify.  If you requested that your care partner not be given the details of your procedure findings, then the procedure report has been included in a sealed envelope for you to review at your convenience later.  YOU SHOULD EXPECT: Some feelings of bloating in the abdomen. Passage of more gas than usual.  Walking can help get rid of the air that was put into your GI tract during the procedure and reduce the bloating. If you had a lower endoscopy (such as a colonoscopy or flexible sigmoidoscopy) you may notice spotting of blood in your stool or on the toilet paper. If you underwent a bowel prep for your procedure, you may not have a normal bowel movement for a few days.  Please Note:  You might notice some irritation and congestion in your nose or some drainage.  This is from the oxygen used during your procedure.  There is no need for concern and it should clear up in a day or so.  SYMPTOMS TO REPORT IMMEDIATELY:    Following upper endoscopy (EGD)  Vomiting of blood or coffee ground material  New chest pain or pain under the shoulder blades  Painful or persistently difficult swallowing  New shortness of breath  Fever of 100F or higher  Black, tarry-looking stools  For urgent or emergent issues, a gastroenterologist can be reached at any hour by calling [phone].   DIET:  We do recommend a small meal at first, but then you may  proceed to your regular diet.  Drink plenty of fluids but you should avoid alcoholic beverages for 24 hours.  ACTIVITY:  You should plan to take it easy for the rest of today and you should NOT DRIVE or use heavy machinery until tomorrow (because of the sedation medicines used during the test).    FOLLOW UP: Our staff will call the number listed on your records 48-72 hours following your procedure to check on you and address any questions or concerns that you may have regarding the information given to you following your procedure. If we do not reach you, we will leave a message.  We will attempt to reach you two times.  During this call, we will ask if you have developed any symptoms of COVID 19. If you develop any symptoms (ie: fever, flu-like symptoms, shortness of breath, cough etc.) before then, please call [phone].  If you test positive for Covid 19 in the 2 weeks post procedure, please call and report this information to [place].    If any biopsies were taken you will be contacted by phone or by letter within the next 1-3 weeks.  Please call us at [phone] if you have not heard about the biopsies in 3 weeks.    SIGNATURES/CONFIDENTIALITY: You and/or your care partner have signed paperwork which will be entered into your electronic medical record.  These signatures attest to the fact that that the information above  on your After Visit Summary has been reviewed and is understood.  Full responsibility of the confidentiality of this discharge information lies with you and/or your care-partner.

## 2018-11-14 NOTE — Progress Notes (Signed)
Called to room to assist during endoscopic procedure.  Patient ID and intended procedure confirmed with present staff. Received instructions for my participation in the procedure from the performing physician.  

## 2018-11-14 NOTE — Progress Notes (Signed)
Temperature- [place]

## 2018-11-16 ENCOUNTER — Telehealth: Payer: Self-pay

## 2018-11-16 ENCOUNTER — Telehealth: Payer: Self-pay | Admitting: *Deleted

## 2018-11-16 NOTE — Telephone Encounter (Signed)
Left message on f/u call 

## 2018-11-16 NOTE — Telephone Encounter (Signed)
  Follow up Call-  Call back number 11/14/2018  Post procedure Call Back phone  # [phone]  Permission to leave phone message Yes  Some recent data might be hidden     Patient questions:  Do you have a fever, pain , or abdominal swelling? No. Pain Score  0 *  Have you tolerated food without any problems? No.  Have you been able to return to your normal activities? Yes.    Do you have any questions about your discharge instructions: Diet   No. Medications  No. Follow up visit  No.  Do you have questions or concerns about your Care? No.  Actions: * If pain score is 4 or above: No action needed, pain <4. 1. Have you developed a fever since your procedure? no  2.   Have you had an respiratory symptoms (SOB or cough) since your procedure? no  3.   Have you tested positive for COVID 19 since your procedure no  4.   Have you had any family members/close contacts diagnosed with the COVID 19 since your procedure?  no   If yes to any of these questions please route to Joylene John, RN and Alphonsa Gin, Therapist, sports.

## 2018-11-17 ENCOUNTER — Telehealth: Payer: Self-pay | Admitting: Gastroenterology

## 2018-11-17 NOTE — Telephone Encounter (Signed)
Pt calling for path results from procedure, please advise.

## 2018-11-17 NOTE — Telephone Encounter (Signed)
Pt called about path results.

## 2018-11-20 ENCOUNTER — Encounter: Payer: Self-pay | Admitting: Gastroenterology

## 2018-11-20 ENCOUNTER — Telehealth: Payer: Self-pay | Admitting: Gastroenterology

## 2018-11-20 NOTE — Telephone Encounter (Signed)
Pt states she has been taking the protonix for about a week. She does not think the symptoms are getting any better and she feels it is making her nauseous. Reports her side and stomach are still hurting. She states she is taking it 74min prior to eating twice a day. Please advise.

## 2018-11-20 NOTE — Telephone Encounter (Signed)
Spoke with pt and she is aware.

## 2018-11-20 NOTE — Telephone Encounter (Signed)
See pathology letter

## 2018-11-20 NOTE — Telephone Encounter (Signed)
Add FDgard 1-2 po tid

## 2018-11-20 NOTE — Telephone Encounter (Signed)
Pt states that protonix are making her nauseous and she still has the same sxs. Pls call her.

## 2018-11-21 NOTE — Telephone Encounter (Signed)
Patient notified of the results of path letter.  All questions answered.  She will call back for any additional questions or concerns

## 2018-11-22 ENCOUNTER — Telehealth: Payer: Self-pay | Admitting: Gastroenterology

## 2018-11-22 DIAGNOSIS — R1013 Epigastric pain: Secondary | ICD-10-CM

## 2018-11-22 NOTE — Telephone Encounter (Signed)
Pt would like to discuss results of EGD.  She has a concerns regarding precancerous polyp.

## 2018-11-23 NOTE — Telephone Encounter (Signed)
Pt called returning your call 

## 2018-11-23 NOTE — Telephone Encounter (Signed)
Left message for patient to call back  

## 2018-11-23 NOTE — Telephone Encounter (Signed)
Patient is asking if she can have a repeat lipase level?  She had elevated lipase 137 on 9/12 in the ED.  She is also requesting an rx for carafate.  Please advise.

## 2018-11-26 NOTE — Telephone Encounter (Signed)
Repeat lipase Carafate tablet or suspension 1 g po tid prn, 5 refills

## 2018-11-27 MED ORDER — SUCRALFATE 1 GM/10ML PO SUSP: 1.0000 g | Freq: Three times a day (TID) | ORAL | 5 refills | Status: DC | PRN

## 2018-11-27 NOTE — Telephone Encounter (Signed)
Left message for patient to call back  

## 2018-11-28 ENCOUNTER — Other Ambulatory Visit: Payer: Self-pay

## 2018-11-28 ENCOUNTER — Encounter: Payer: Self-pay | Admitting: Family Medicine

## 2018-11-28 ENCOUNTER — Ambulatory Visit (INDEPENDENT_AMBULATORY_CARE_PROVIDER_SITE_OTHER): Payer: Medicaid Other | Admitting: Family Medicine

## 2018-11-28 ENCOUNTER — Ambulatory Visit: Payer: Medicaid Other | Admitting: Family Medicine

## 2018-11-28 ENCOUNTER — Telehealth: Payer: Self-pay

## 2018-11-28 DIAGNOSIS — R739 Hyperglycemia, unspecified: Secondary | ICD-10-CM

## 2018-11-28 DIAGNOSIS — R748 Abnormal levels of other serum enzymes: Secondary | ICD-10-CM | POA: Diagnosis not present

## 2018-11-28 DIAGNOSIS — M791 Myalgia, unspecified site: Secondary | ICD-10-CM | POA: Diagnosis not present

## 2018-11-28 DIAGNOSIS — Z23 Encounter for immunization: Secondary | ICD-10-CM | POA: Diagnosis not present

## 2018-11-28 LAB — GLUCOSE, POCT (MANUAL RESULT ENTRY): POC Glucose: 130 mg/dl — AB (ref 70–99)

## 2018-11-28 LAB — POCT GLYCOSYLATED HEMOGLOBIN (HGB A1C): Hemoglobin A1C: 5.8 % — AB (ref 4.0–5.6)

## 2018-11-28 NOTE — Telephone Encounter (Signed)
Error

## 2018-11-28 NOTE — Patient Instructions (Signed)
Good to see you today!  Thanks for coming in.  I will send you a message in My Chart about your labs  Contact Dr Fuller Plan about lowering your Protonix dose  Let me know if any new symptoms or worsening muscle aches or any rashes   We should check a cholesterol panel in about 6 months   HAPPY BIRTHDAY!

## 2018-11-28 NOTE — Assessment & Plan Note (Signed)
Elevated on recent lab work.  Will recheck

## 2018-11-28 NOTE — Assessment & Plan Note (Signed)
Perhaps related to protonix.  See after visit summary.  No signs of muscle weakness or nerve impingement.  Will check ck

## 2018-11-28 NOTE — Assessment & Plan Note (Signed)
No further abdomen pain.  Will recheck to ensure is normal

## 2018-11-28 NOTE — Telephone Encounter (Signed)
Pt LVM on nurse line.Pt was seen by Dr. Erin Hearing today. Pt would like to get an A1C check. Please give her a call on [phone] and let her know if this is something she can do.

## 2018-11-28 NOTE — Addendum Note (Signed)
Addended by: Christen Bame D on: 11/28/2018 04:57 PM   Modules accepted: Orders

## 2018-11-28 NOTE — Progress Notes (Signed)
Subjective  Denise Aguirre is a 58 y.o. female is presenting with the following  MUSCLE PAIN Ever since started twice a day protonix feels that she has more muscle pains in her neck and upper back.   Did not happen when taking once a day.  Takes protonix at noon and 730 PM. Her abdomen pain is much improved.  No muscle weakness or focal pain or change in sensation or falls or rashes or change in urine color  ELEVATED LIPASE On recent GI work up.  She has no pain now but is concerned and would like to make sure it is back to normal  HYPERGLYCEMIA On recent blood work.  No polyuria or dipsia.   Chief Complaint noted Review of Symptoms - see HPI PMH - Smoking status noted.    Objective Vital Signs reviewed BP 110/62   Pulse 69   LMP 09/06/2013   SpO2 98%  Neurologic exam : Cn 2-7 intact Strength equal & normal in upper & lower extremities Able to walk on heels and toes.   Balance normal  No specific pain to muscle palpation. Diffuse mild upper back tenderness Skin:  Intact without suspicious lesions or rashes Neck:  No deformities, thyromegaly, masses, or tenderness noted.   Supple with full range of motion without pain.   Assessments/Plans  Elevated lipase No further abdomen pain.  Will recheck to ensure is normal   Hyperglycemia Elevated on recent lab work.  Will recheck   Muscle pain Perhaps related to protonix.  See after visit summary.  No signs of muscle weakness or nerve impingement.  Will check ck

## 2018-11-28 NOTE — Telephone Encounter (Signed)
Patient notified

## 2018-11-29 LAB — CK: Total CK: 54 U/L (ref 32–182)

## 2018-11-29 LAB — LIPASE: Lipase: 19 U/L (ref 14–72)

## 2018-11-30 ENCOUNTER — Encounter: Payer: Self-pay | Admitting: Family Medicine

## 2018-12-01 ENCOUNTER — Encounter: Payer: Self-pay | Admitting: Family Medicine

## 2018-12-27 DIAGNOSIS — H04123 Dry eye syndrome of bilateral lacrimal glands: Secondary | ICD-10-CM | POA: Diagnosis not present

## 2018-12-28 DIAGNOSIS — R102 Pelvic and perineal pain: Secondary | ICD-10-CM | POA: Diagnosis not present

## 2018-12-28 DIAGNOSIS — G894 Chronic pain syndrome: Secondary | ICD-10-CM | POA: Diagnosis not present

## 2018-12-28 DIAGNOSIS — G8929 Other chronic pain: Secondary | ICD-10-CM | POA: Diagnosis not present

## 2019-01-01 DIAGNOSIS — Z79899 Other long term (current) drug therapy: Secondary | ICD-10-CM | POA: Diagnosis not present

## 2019-01-01 DIAGNOSIS — Z5181 Encounter for therapeutic drug level monitoring: Secondary | ICD-10-CM | POA: Diagnosis not present

## 2019-01-18 DIAGNOSIS — Z20828 Contact with and (suspected) exposure to other viral communicable diseases: Secondary | ICD-10-CM | POA: Diagnosis not present

## 2019-01-22 DIAGNOSIS — Z886 Allergy status to analgesic agent status: Secondary | ICD-10-CM | POA: Diagnosis not present

## 2019-01-22 DIAGNOSIS — S82831A Other fracture of upper and lower end of right fibula, initial encounter for closed fracture: Secondary | ICD-10-CM | POA: Diagnosis not present

## 2019-01-22 DIAGNOSIS — Z7951 Long term (current) use of inhaled steroids: Secondary | ICD-10-CM | POA: Diagnosis not present

## 2019-01-22 DIAGNOSIS — Z79899 Other long term (current) drug therapy: Secondary | ICD-10-CM | POA: Diagnosis not present

## 2019-01-22 DIAGNOSIS — Z79890 Hormone replacement therapy (postmenopausal): Secondary | ICD-10-CM | POA: Diagnosis not present

## 2019-01-22 DIAGNOSIS — S82444A Nondisplaced spiral fracture of shaft of right fibula, initial encounter for closed fracture: Secondary | ICD-10-CM | POA: Diagnosis not present

## 2019-01-22 DIAGNOSIS — G8911 Acute pain due to trauma: Secondary | ICD-10-CM | POA: Diagnosis not present

## 2019-01-22 DIAGNOSIS — Z87891 Personal history of nicotine dependence: Secondary | ICD-10-CM | POA: Diagnosis not present

## 2019-01-22 DIAGNOSIS — S93401A Sprain of unspecified ligament of right ankle, initial encounter: Secondary | ICD-10-CM | POA: Diagnosis not present

## 2019-01-22 DIAGNOSIS — Z91041 Radiographic dye allergy status: Secondary | ICD-10-CM | POA: Diagnosis not present

## 2019-01-24 ENCOUNTER — Other Ambulatory Visit: Payer: Self-pay | Admitting: Family Medicine

## 2019-01-29 DIAGNOSIS — S8264XA Nondisplaced fracture of lateral malleolus of right fibula, initial encounter for closed fracture: Secondary | ICD-10-CM | POA: Diagnosis not present

## 2019-01-29 DIAGNOSIS — M25571 Pain in right ankle and joints of right foot: Secondary | ICD-10-CM | POA: Diagnosis not present

## 2019-02-26 DIAGNOSIS — S8264XD Nondisplaced fracture of lateral malleolus of right fibula, subsequent encounter for closed fracture with routine healing: Secondary | ICD-10-CM | POA: Diagnosis not present

## 2019-02-26 DIAGNOSIS — M25571 Pain in right ankle and joints of right foot: Secondary | ICD-10-CM | POA: Diagnosis not present

## 2019-02-28 ENCOUNTER — Other Ambulatory Visit: Payer: Self-pay

## 2019-02-28 ENCOUNTER — Telehealth (INDEPENDENT_AMBULATORY_CARE_PROVIDER_SITE_OTHER): Payer: Medicaid Other | Admitting: Family Medicine

## 2019-02-28 DIAGNOSIS — E559 Vitamin D deficiency, unspecified: Secondary | ICD-10-CM

## 2019-02-28 DIAGNOSIS — Z13220 Encounter for screening for lipoid disorders: Secondary | ICD-10-CM | POA: Diagnosis not present

## 2019-02-28 DIAGNOSIS — W19XXXA Unspecified fall, initial encounter: Secondary | ICD-10-CM

## 2019-02-28 DIAGNOSIS — R739 Hyperglycemia, unspecified: Secondary | ICD-10-CM

## 2019-02-28 DIAGNOSIS — T148XXA Other injury of unspecified body region, initial encounter: Secondary | ICD-10-CM | POA: Insufficient documentation

## 2019-02-28 DIAGNOSIS — E2839 Other primary ovarian failure: Secondary | ICD-10-CM

## 2019-02-28 NOTE — Assessment & Plan Note (Signed)
With recent low velocity fracture will screen for osteoporosis

## 2019-02-28 NOTE — Progress Notes (Signed)
Subjective  Denise Aguirre is a 59 y.o. female is presenting with the following  FRACTURE Recent fibula fracture with low fall.  Concerned about osteoporosis.  Last Dexa five years ago.  Sister and Mother both have osteoporosis  Is taking VitD 2K u and calcium  HYPERGLYCEMIA Watching her weight although harder with recent fracture  Vit D Deficiency Has not had checke recently   Objective Vital Signs reviewed LMP 09/06/2013  Psych:  Cognition and judgment appear intact. Alert, communicative  and cooperative with normal attention span and concentration. No apparent delusions, illusions, hallucinations  Assessments/Plans  Screening cholesterol level Given elevated blood sugar and inactivity will recheck   Vitamin D deficiency On replacement Check level   Fracture With recent low velocity fracture will screen for osteoporosis    See after visit summary for details of patient instructions

## 2019-02-28 NOTE — Assessment & Plan Note (Signed)
On replacement Check level

## 2019-02-28 NOTE — Assessment & Plan Note (Signed)
Given elevated blood sugar and inactivity will recheck

## 2019-03-08 DIAGNOSIS — M25571 Pain in right ankle and joints of right foot: Secondary | ICD-10-CM | POA: Diagnosis not present

## 2019-03-19 DIAGNOSIS — M25571 Pain in right ankle and joints of right foot: Secondary | ICD-10-CM | POA: Diagnosis not present

## 2019-03-23 DIAGNOSIS — M25571 Pain in right ankle and joints of right foot: Secondary | ICD-10-CM | POA: Diagnosis not present

## 2019-03-26 DIAGNOSIS — M25571 Pain in right ankle and joints of right foot: Secondary | ICD-10-CM | POA: Diagnosis not present

## 2019-03-26 DIAGNOSIS — S8264XD Nondisplaced fracture of lateral malleolus of right fibula, subsequent encounter for closed fracture with routine healing: Secondary | ICD-10-CM | POA: Diagnosis not present

## 2019-04-18 ENCOUNTER — Other Ambulatory Visit: Payer: Self-pay | Admitting: *Deleted

## 2019-04-18 MED ORDER — PANTOPRAZOLE SODIUM 40 MG PO TBEC: 40.0000 mg | DELAYED_RELEASE_TABLET | Freq: Two times a day (BID) | ORAL | 2 refills | Status: DC

## 2019-04-19 ENCOUNTER — Other Ambulatory Visit: Payer: Medicaid Other

## 2019-04-26 DIAGNOSIS — G8929 Other chronic pain: Secondary | ICD-10-CM | POA: Diagnosis not present

## 2019-04-26 DIAGNOSIS — R102 Pelvic and perineal pain: Secondary | ICD-10-CM | POA: Diagnosis not present

## 2019-04-26 DIAGNOSIS — G894 Chronic pain syndrome: Secondary | ICD-10-CM | POA: Diagnosis not present

## 2019-04-30 ENCOUNTER — Telehealth: Payer: Self-pay

## 2019-04-30 MED ORDER — CETIRIZINE HCL 10 MG PO TABS: 10.0000 mg | ORAL_TABLET | Freq: Every day | ORAL | 11 refills | Status: DC

## 2019-04-30 NOTE — Telephone Encounter (Signed)
Patient calls nurse line requesting refill of cetirizine 10mg  tablets. Patient states that her allergies have been flaring up due to the change in weather. This medication is not on patient's current medication list.   To PCP  Please advise  Talbot Grumbling, RN

## 2019-04-30 NOTE — Telephone Encounter (Signed)
I sent in

## 2019-05-02 ENCOUNTER — Other Ambulatory Visit: Payer: Medicaid Other

## 2019-05-02 ENCOUNTER — Other Ambulatory Visit: Payer: Self-pay

## 2019-05-02 DIAGNOSIS — Z13220 Encounter for screening for lipoid disorders: Secondary | ICD-10-CM

## 2019-05-02 DIAGNOSIS — E559 Vitamin D deficiency, unspecified: Secondary | ICD-10-CM | POA: Diagnosis not present

## 2019-05-02 DIAGNOSIS — R739 Hyperglycemia, unspecified: Secondary | ICD-10-CM

## 2019-05-03 ENCOUNTER — Encounter: Payer: Self-pay | Admitting: Family Medicine

## 2019-05-03 DIAGNOSIS — Z5181 Encounter for therapeutic drug level monitoring: Secondary | ICD-10-CM | POA: Diagnosis not present

## 2019-05-03 DIAGNOSIS — Z79899 Other long term (current) drug therapy: Secondary | ICD-10-CM | POA: Diagnosis not present

## 2019-05-03 LAB — LIPID PANEL
Chol/HDL Ratio: 4 ratio (ref 0.0–4.4)
Cholesterol, Total: 254 mg/dL — ABNORMAL HIGH (ref 100–199)
HDL: 63 mg/dL (ref >39)
LDL Chol Calc (NIH): 180 mg/dL — ABNORMAL HIGH (ref 0–99)
Triglycerides: 65 mg/dL (ref 0–149)
VLDL Cholesterol Cal: 11 mg/dL (ref 5–40)

## 2019-05-03 LAB — MAGNESIUM: Magnesium: 2.1 mg/dL (ref 1.6–2.3)

## 2019-05-03 LAB — HEMOGLOBIN A1C
Est. average glucose Bld gHb Est-mCnc: 126 mg/dL
Hgb A1c MFr Bld: 6 % — ABNORMAL HIGH (ref 4.8–5.6)

## 2019-05-03 LAB — VITAMIN D 25 HYDROXY (VIT D DEFICIENCY, FRACTURES): Vit D, 25-Hydroxy: 36.8 ng/mL (ref 30.0–100.0)

## 2019-05-08 DIAGNOSIS — F331 Major depressive disorder, recurrent, moderate: Secondary | ICD-10-CM | POA: Diagnosis not present

## 2019-05-15 ENCOUNTER — Telehealth (INDEPENDENT_AMBULATORY_CARE_PROVIDER_SITE_OTHER): Payer: Medicaid Other | Admitting: Family Medicine

## 2019-05-15 ENCOUNTER — Other Ambulatory Visit: Payer: Self-pay

## 2019-05-15 DIAGNOSIS — R739 Hyperglycemia, unspecified: Secondary | ICD-10-CM | POA: Diagnosis not present

## 2019-05-15 DIAGNOSIS — E1169 Type 2 diabetes mellitus with other specified complication: Secondary | ICD-10-CM | POA: Insufficient documentation

## 2019-05-15 MED ORDER — ATORVASTATIN CALCIUM 40 MG PO TABS: 40.0000 mg | ORAL_TABLET | Freq: Every day | ORAL | 3 refills | Status: DC

## 2019-05-15 NOTE — Progress Notes (Signed)
[facility] Telemedicine Visit  Patient consented to have virtual visit. Method of visit: Video  Encounter participants: Patient: Denise Aguirre - located at home Provider: Lind Covert - located at office Others (if applicable): no  Chief Complaint: FOLLOW UP labs  HPI:  DIABETES Elevated A1c.  No symptoms.  Had gestational diabetes 20 years ago Does not drink a lot of sweet drinks or eat foods.  Weight is stable  LIPIDS Gradually increasing LDL for years.  Would like to see a nutritionist   ROS: per HPI   Exam:  Respiratory: normal   Assessment/Plan:  Hyperglycemia Would like to try diet and resuming exercise after her recent fracture.  Will recheck in 3 months  Hyperlipidemia associated with type 2 diabetes mellitus ([place]) Will start lipitor 40 mg daily and recheck in 3 months     Time spent 15 minutes

## 2019-05-15 NOTE — Assessment & Plan Note (Signed)
Will start lipitor 40 mg daily and recheck in 3 months

## 2019-05-15 NOTE — Assessment & Plan Note (Signed)
Would like to try diet and resuming exercise after her recent fracture.  Will recheck in 3 months

## 2019-05-17 ENCOUNTER — Ambulatory Visit
Admission: RE | Admit: 2019-05-17 | Discharge: 2019-05-17 | Disposition: A | Discharge status: Home / Self Care | Payer: Medicaid Other | Source: Ambulatory Visit | Attending: Family Medicine | Admitting: Family Medicine

## 2019-05-17 ENCOUNTER — Other Ambulatory Visit: Payer: Self-pay

## 2019-05-17 DIAGNOSIS — M8589 Other specified disorders of bone density and structure, multiple sites: Secondary | ICD-10-CM | POA: Diagnosis not present

## 2019-05-17 DIAGNOSIS — E2839 Other primary ovarian failure: Secondary | ICD-10-CM

## 2019-05-17 DIAGNOSIS — Z780 Asymptomatic menopausal state: Secondary | ICD-10-CM | POA: Diagnosis not present

## 2019-05-23 ENCOUNTER — Encounter: Payer: Self-pay | Admitting: Family Medicine

## 2019-05-23 ENCOUNTER — Telehealth: Payer: Self-pay | Admitting: Family Medicine

## 2019-05-23 NOTE — Telephone Encounter (Signed)
Pt is calling to get results from [facility] scan. If you could please call the pt and advise. Best number [phone]

## 2019-05-23 NOTE — Telephone Encounter (Signed)
Sent mychart message

## 2019-06-03 ENCOUNTER — Encounter (HOSPITAL_BASED_OUTPATIENT_CLINIC_OR_DEPARTMENT_OTHER): Payer: Self-pay | Admitting: Emergency Medicine

## 2019-06-03 ENCOUNTER — Emergency Department (HOSPITAL_BASED_OUTPATIENT_CLINIC_OR_DEPARTMENT_OTHER)
Admission: EM | Admit: 2019-06-03 | Discharge: 2019-06-03 | Disposition: A | Discharge status: Home / Self Care | Payer: Medicaid Other | Attending: Emergency Medicine | Admitting: Emergency Medicine

## 2019-06-03 ENCOUNTER — Other Ambulatory Visit: Payer: Self-pay

## 2019-06-03 ENCOUNTER — Emergency Department (HOSPITAL_BASED_OUTPATIENT_CLINIC_OR_DEPARTMENT_OTHER): Payer: Medicaid Other

## 2019-06-03 DIAGNOSIS — Y999 Unspecified external cause status: Secondary | ICD-10-CM | POA: Insufficient documentation

## 2019-06-03 DIAGNOSIS — S6010XA Contusion of unspecified finger with damage to nail, initial encounter: Secondary | ICD-10-CM

## 2019-06-03 DIAGNOSIS — W231XXA Caught, crushed, jammed, or pinched between stationary objects, initial encounter: Secondary | ICD-10-CM | POA: Insufficient documentation

## 2019-06-03 DIAGNOSIS — Z91041 Radiographic dye allergy status: Secondary | ICD-10-CM | POA: Diagnosis not present

## 2019-06-03 DIAGNOSIS — E1169 Type 2 diabetes mellitus with other specified complication: Secondary | ICD-10-CM | POA: Diagnosis not present

## 2019-06-03 DIAGNOSIS — S6710XA Crushing injury of unspecified finger(s), initial encounter: Secondary | ICD-10-CM

## 2019-06-03 DIAGNOSIS — Z888 Allergy status to other drugs, medicaments and biological substances status: Secondary | ICD-10-CM | POA: Insufficient documentation

## 2019-06-03 DIAGNOSIS — S6701XA Crushing injury of right thumb, initial encounter: Secondary | ICD-10-CM | POA: Insufficient documentation

## 2019-06-03 DIAGNOSIS — Z7984 Long term (current) use of oral hypoglycemic drugs: Secondary | ICD-10-CM | POA: Diagnosis not present

## 2019-06-03 DIAGNOSIS — S60111A Contusion of right thumb with damage to nail, initial encounter: Secondary | ICD-10-CM | POA: Diagnosis not present

## 2019-06-03 DIAGNOSIS — Y9389 Activity, other specified: Secondary | ICD-10-CM | POA: Insufficient documentation

## 2019-06-03 DIAGNOSIS — S60011A Contusion of right thumb without damage to nail, initial encounter: Secondary | ICD-10-CM | POA: Diagnosis not present

## 2019-06-03 DIAGNOSIS — Y92810 Car as the place of occurrence of the external cause: Secondary | ICD-10-CM | POA: Insufficient documentation

## 2019-06-03 DIAGNOSIS — E785 Hyperlipidemia, unspecified: Secondary | ICD-10-CM | POA: Insufficient documentation

## 2019-06-03 NOTE — ED Provider Notes (Signed)
[facility] EMERGENCY DEPARTMENT Provider Note   CSN: [id number] Arrival date & time: 06/03/19  1526     History Chief Complaint  Patient presents with  . Finger Injury    Denise Aguirre is a 59 y.o. female.  59yo female with right thumb pain/swelling/bruising. Patient accidentally closed her thumb in the trunk of a car yesterday, trunk did latch and had to be reopened to get her thumb out. Patient is concerned her thumb is fractured. No open wounds. No other injuries.         Past Medical History:  Diagnosis Date  . Allergy   . Cystitis, interstitial    chronic since 1999  . Diverticulitis   . Osteopenia   . Other malaise and fatigue   . Screening cholesterol level     Patient Active Problem List   Diagnosis Date Noted  . Hyperlipidemia associated with type 2 diabetes mellitus ([place]) 05/15/2019  . Fracture 02/28/2019  . Visual problems 08/19/2017  . AK (actinic keratosis) 05/31/2017  . Plantar fasciitis 08/04/2016  . Vitamin D deficiency 06/26/2014  . Hyperglycemia 06/26/2014  . Interstitial cystitis 04/14/2006    Past Surgical History:  Procedure Laterality Date  . COLONOSCOPY    . dental biopsy     right buccal mucosa   . INTERSTIM IMPLANT PLACEMENT     and removal in 2008  . TONSILLECTOMY     1980  . TUBAL LIGATION     1995  . VAGINAL HYSTERECTOMY    . WRIST FRACTURE SURGERY Right      OB History   No obstetric history on file.     Family History  Problem Relation Age of Onset  . Stroke Father   . Skin cancer Maternal Grandmother   . Colon cancer Neg Hx   . Esophageal cancer Neg Hx   . Pancreatic cancer Neg Hx   . Stomach cancer Neg Hx   . Liver disease Neg Hx   . Rectal cancer Neg Hx     Social History   Tobacco Use  . Smoking status: Former Smoker    Quit date: 11/22/1985    Years since quitting: 33.5  . Smokeless tobacco: Never Used  Substance Use Topics  . Alcohol use: No  . Drug use: No    Home  Medications Prior to Admission medications   Medication Sig Start Date End Date Taking? Authorizing Provider  AMBULATORY NON FORMULARY MEDICATION GI Cocktail- 54ml Viscous lidocaine, 50ml- 10mg /36ml Dicyclomine, 269ml Maalox - swish and swallow 5 to 47ml TID prn pain 11/13/18   Levin Erp, PA  atorvastatin (LIPITOR) 40 MG tablet Take 1 tablet (40 mg total) by mouth daily. 05/15/19   Lind Covert, MD  Calcium Carb-Cholecalciferol 600-200 MG-UNIT TABS Take 2 tablets by mouth daily.    [provider]  cetirizine (ZYRTEC) 10 MG tablet Take 1 tablet (10 mg total) by mouth daily. 04/30/19   Lind Covert, MD  Docusate Calcium (STOOL SOFTENER PO) Take 2 capsules by mouth at bedtime.    [provider]  famotidine (PEPCID) 20 MG tablet Take 20 mg by mouth 2 (two) times daily.    [provider]  methadone (DOLOPHINE) 5 MG tablet Take 5 mg by mouth 2 (two) times daily.    [provider]  Omega-3 Fatty Acids (FISH OIL PO) Take 2 capsules by mouth daily.     [provider]  ondansetron (ZOFRAN ODT) 4 MG disintegrating tablet Take 1 tablet (  4 mg total) by mouth every 8 (eight) hours as needed. 10/28/18   Isla Pence, MD  pantoprazole (PROTONIX) 40 MG tablet Take 1 tablet (40 mg total) by mouth 2 (two) times daily before a meal. 04/18/19   Lemmon, Lavone Nian, PA  sucralfate (CARAFATE) 1 GM/10ML suspension Take 10 mLs (1 g total) by mouth 3 (three) times daily as needed. 11/27/18   Ladene Artist, MD    Allergies    Ivp dye [iodinated diagnostic agents] and Topiramate  Review of Systems   Review of Systems  Constitutional: Negative for fever.  Musculoskeletal: Positive for arthralgias, joint swelling and myalgias.  Skin: Negative for wound.  Allergic/Immunologic: Negative for immunocompromised state.  Neurological: Negative for numbness.  Hematological: Does not bruise/bleed easily.    Physical Exam Updated Vital  Signs BP (!) 149/78 (BP Location: Right Arm)   Pulse 77   Temp 98.7 F (37.1 C) (Oral)   Resp 18   Ht 5\' 5"  (1.651 m)   Wt 68.9 kg   LMP 09/06/2013   SpO2 98%   BMI 25.29 kg/m   Physical Exam Vitals and nursing note reviewed.  Constitutional:      General: She is not in acute distress.    Appearance: She is well-developed. She is not diaphoretic.  HENT:     Head: Normocephalic and atraumatic.  Cardiovascular:     Pulses: Normal pulses.  Pulmonary:     Effort: Pulmonary effort is normal.  Musculoskeletal:        General: Swelling, tenderness and signs of injury present. No deformity. Normal range of motion.       Arms:  Skin:    General: Skin is warm and dry.     Capillary Refill: Capillary refill takes less than 2 seconds.     Findings: No erythema or rash.  Neurological:     Mental Status: She is alert and oriented to person, place, and time.  Psychiatric:        Behavior: Behavior normal.     ED Results / Procedures / Treatments   Labs (all labs ordered are listed, but only abnormal results are displayed) Labs Reviewed - No data to display  EKG None  Radiology DG Finger Thumb Right  Result Date: 06/03/2019 CLINICAL DATA:  Right thumb pain, bruising and swelling following a crush injury. EXAM: RIGHT THUMB 2+V COMPARISON:  Right hand dated 04/15/2013. FINDINGS: Mild 1st IP joint degenerative spur formation. Minimal 1st MCP joint degenerative spur formation. No fracture, dislocation or radiopaque foreign body seen. IMPRESSION: No fracture. Mild degenerative changes. Electronically Signed   By: Claudie Revering M.D.   On: 06/03/2019 16:42    Procedures Procedures (including critical care time)  Medications Ordered in ED Medications - No data to display  ED Course  I have reviewed the triage vital signs and the nursing notes.  Pertinent labs & imaging results that were available during my care of the patient were reviewed by me and considered in my medical  decision making (see chart for details).  Clinical Course as of Jun 02 1656  Sun Apr 18, 277  1135 59 year old female with right thumb injury after getting closed in a car trunk yesterday.  On exam has swelling with tenderness to the distal right thumb with subungual hematoma.  X-rays negative for fracture.  Attempted nail trephination without drainage, tolerated well, appears with blood has clotted and will not drain.  Patient unable to take NSAIDs, will take Tylenol, apply ice and elevate.   [  LM]    Clinical Course User Index [LM] Roque Lias   MDM Rules/Calculators/A&P                      Final Clinical Impression(s) / ED Diagnoses Final diagnoses:  Crushing injury of finger, initial encounter  Subungual hematoma of digit of hand, initial encounter    Rx / DC Orders ED Discharge Orders    None       Tacy Learn, PA-C 06/03/19 1658    Lucrezia Starch, MD 06/04/19 2337

## 2019-06-03 NOTE — ED Triage Notes (Signed)
Pt reports car trunk closed on left thumb. Bruising and swelling noted.

## 2019-06-03 NOTE — Discharge Instructions (Addendum)
Tylenol as needed as directed for pain. Apply ice for 20 minutes at a time and elevated to help with pain and swelling.

## 2019-06-05 ENCOUNTER — Telehealth: Payer: Self-pay

## 2019-06-05 NOTE — Telephone Encounter (Signed)
Patient left message on nurse line regarding muscle aches after starting atorvastatin. Patient started on medication on 3/30.  Called patient to gather more information. Patient did not answer, HIPPA compliant message left for patient to return phone call to office.    Talbot Grumbling, RN

## 2019-06-05 NOTE — Telephone Encounter (Signed)
Patient returned phone call to nurse line. Patient reports increased fatigue, muscle aches (upper body- arms and shoulder bilaterally) since starting lipitor. Patient states that she had been told that COQ10 could be helpful in treating muscle aches, while still continuing medication. Informed patient I was unfamiliar with this course and that I would ask Dr. Erin Hearing if this is appropriate.   To PCP   Please advise  Talbot Grumbling, RN

## 2019-06-05 NOTE — Telephone Encounter (Signed)
Patient returning call to nurse line to follow up on message.   To PCP  Talbot Grumbling, RN

## 2019-06-06 ENCOUNTER — Encounter: Payer: Self-pay | Admitting: Family Medicine

## 2019-06-06 NOTE — Telephone Encounter (Signed)
Patient LVM on nurse line to follow up from yesterday.   To PCP  Talbot Grumbling, RN

## 2019-06-06 NOTE — Telephone Encounter (Signed)
Sent My Chart Message  Please encourage her to communicate this way  Thanks

## 2019-06-19 ENCOUNTER — Encounter: Payer: Self-pay | Admitting: Family Medicine

## 2019-06-19 ENCOUNTER — Other Ambulatory Visit: Payer: Self-pay

## 2019-06-19 ENCOUNTER — Ambulatory Visit (INDEPENDENT_AMBULATORY_CARE_PROVIDER_SITE_OTHER): Payer: Medicaid Other | Admitting: Family Medicine

## 2019-06-19 DIAGNOSIS — E1169 Type 2 diabetes mellitus with other specified complication: Secondary | ICD-10-CM | POA: Diagnosis not present

## 2019-06-19 DIAGNOSIS — R739 Hyperglycemia, unspecified: Secondary | ICD-10-CM | POA: Diagnosis not present

## 2019-06-19 DIAGNOSIS — E785 Hyperlipidemia, unspecified: Secondary | ICD-10-CM

## 2019-06-19 NOTE — Patient Instructions (Addendum)
Benefits of adding more vegetables to your diet:   - Vegetables are a good source of fiber (mostly soluble), which can help lower blood lipids as well as blood sugar.  The effect is small, but still worth trying.    - Vegetables can provide vitamin K and magnesium, and the leafy greens are good sources of calcium, all good for bone health.  In addition, vegetables can buffer an acidic diet, which spares calcium from the bones that otherwise serves this purpose.    Intermittent Fasting:  Although this can be practiced many different ways, probably the biggest benefit of IF is in shrinking the "window of eating time" in any 24-hour period.  I recommend you aim for 10 or 11 hours as your eating time.    - Keep in mind that insulin sensitivity tends to decline later in the day/evening.  For this reason, it makes sense to avoid eating too much in general and too much carbohydrate in particular in the evening.    Supplements I recommend you continue your 2000 IU vitamin D3, and use a low-dose (e.g., 250 mg) calcium citrate supplement 1-2 times a day.  Get as much calcium from food as possible.  Adding more leafy greens will be helpful in this way, as will your homemade yogurt and fortified soy milk.  Checking your Vitamin D in March 2022 wil help you determine if you need to continue on 2000 IU/day.  You may be able to reduce your daily supplement to 1000 IU by then.    Given the above, and considering your health concerns of managing blood sugar and cholesterol as well as optimizing bone health, dietary recommendations are as follows:  1. Eat breakfast; this can be a small bowl of cooked (not instant) oatmeal and 1 boiled egg.  Or you may want to try protein powder in 12-16 oz of soy milk.  (The objective is to obtain both protein and carbohydrate, allowing your body the energy needed to fuel your day.)  - The celery juice you've been getting is very low in calories, and is also actually relatively low in  fiber.  While a cup of berries will have a few more calories than celery juice, they are probably a better source of fiber.   2. Foods to increase:  - Unsalted nuts and seeds, fatty fish (salmon), olive or canola oil, avocado:  These sources of polyunsaturated and mono-unsaturated fats will help to decrease LDL.    - Vegetables: Increase for reasons mentioned above; aim for 1/2 the volume of both lunch and dinner coming from vegetables.   3. Get most of your daily carbohydrate intake earlier in the day.  This does not mean avoiding carb's late in the day, but rather being mindful to have larger carb food portions and higher-glycemic foods earlier in the day.  An example of a good evening snack that is NOT high-glycemic is a bowl of plain yogurt with berries and nuts.     NOTE: Some foods recommended above may not be tolerated well related to your interstitial cystitis.  For example, some people will not tolerate oranges, strawberries, or artificial sweeteners (I.e., in protein powders).  You will need to determine what works best for you, as tolerances vary among people with IC.    There is a lot of information on diet and IC at the following:  [email or web address]

## 2019-06-19 NOTE — Progress Notes (Signed)
Telehealth Encounter  I connected with TZIVIA LOIBL (MRN [id number]) on 06/19/2019 by MyChart video-enabled, HIPAA-compliant telemedicine application, verified that I was speaking with the correct person using two identifiers, and that the patient was in a private environment conducive to confidentiality.  The patient agreed to proceed.  Provider was Kennith Center, PhD, RD, LDN, CEDRD Provider was located at [facility] during this telehealth encounter; patient was in her car (parked).  Appt start time: 1530 end time: 1630 (1 hour)  Reason for telehealth visit: Referral by Dr. Erin Hearing, for Medical Nutrition Therapy related to hyperglycemia (R73.9) and elevated LDL (E78.5).    Relevant history/background: Labs on 05/02/19 indicated elevated levels of BG (126 mg/dL), A1C (6.0%), LDL (180 mg/dL), and TC (254 mg/dL).  Lipitor was prescribed 05/15/19, but pt has not tolerated well so far, and is interested in optimizing diet and exercise to help normalize lipids and blood glucose.  Patient also has a recent diagnosis of osteopenia (lumbar spine T score = -2.3).  Had GDM when pregnant with daughter.    Assessment: Ms. Danner interstitial cystitis consistently prevents her getting good quality sleep.  This may contribute to some metabolic changes reflected in elevated lipid and glucose levels, as she has been sleep-deprived for years.  Efforts at pain management have been mostly unsuccessful, and she has most discomfort during the night and early morning.  She was aware of some poorly tolerated foods (spicy or acidic foods, onions), but was not familiar with general dietary recommendations for IC.   Usual eating pattern: 2 meals and 1-3 snacks per day. Frequent foods and beverages: water, herb tea, homemade kambucha; homemade soup, egg whites, celery juice (in AM), salads, chx, fruit.   Avoided foods: tries to avoid sweets, avoids fried foods, butter, mayo, most cheese,  bread.  Limits fluids after dinner b/c of IC.   Usual physical activity: walks 30 min TM 5-6 X wk and 20 min walking dog 7  days a week.  Sleep: Estimates average of ~7 hours of sleep/night; up 4-6 X night b/c of interstitial cystitis (dx'd 1999).   24-hr recall:  (Up at 8 AM) B (9 AM)-  ~1 c juiced celery  - Walked 30 min. -  Snk ( AM)-  --- L (1:30 PM)-  3 egg whites w/ mushrms & onion, baggie of pita chips, 1  homemade cookie of oats, coconut oil, honey, cacao powder, chia seeds Snk ( PM)-  Water  - Walked dog 20 min. -  D (7 PM)-  2 c mixture of hamburger, rice, green beans; water to drink Snk (9 PM)-  1 tangerine Typical day? No.  More typical dinner is salad and a protein source.    Intervention: Completed diet and exercise history, and established behavioral goals.   For recommendations and goals, see Patient Instructions.    Follow-up: Per patient.    Felicity Penix,JEANNIE

## 2019-06-20 ENCOUNTER — Encounter: Payer: Self-pay | Admitting: Family Medicine

## 2019-06-20 ENCOUNTER — Telehealth: Payer: Self-pay

## 2019-06-20 NOTE — Telephone Encounter (Signed)
Patient calls nurse line requesting continuous glucose monitor, preferably the [facility]. Informed patient that diagnosis of pre-diabetes would most likely not be covered with medicaid.   Patient states that she spoke with representative at [facility] who advised her to contact PCP for PA through [facility]. After looking at guidelines it does not seem that medicaid would approve this even with a PA.   Patient's last A1c 6.0, and is currently diet-controlled.   To Valentina Lucks and PCP  Please advise how patient should proceed.   Talbot Grumbling, RN

## 2019-06-20 NOTE — Telephone Encounter (Signed)
Will it be ok, if we set up a meeting to develop a protocol or create a process?

## 2019-06-28 DIAGNOSIS — G894 Chronic pain syndrome: Secondary | ICD-10-CM | POA: Diagnosis not present

## 2019-06-28 DIAGNOSIS — N3010 Interstitial cystitis (chronic) without hematuria: Secondary | ICD-10-CM | POA: Diagnosis not present

## 2019-06-28 DIAGNOSIS — R102 Pelvic and perineal pain: Secondary | ICD-10-CM | POA: Diagnosis not present

## 2019-06-28 DIAGNOSIS — G8929 Other chronic pain: Secondary | ICD-10-CM | POA: Diagnosis not present

## 2019-07-24 ENCOUNTER — Ambulatory Visit (INDEPENDENT_AMBULATORY_CARE_PROVIDER_SITE_OTHER): Payer: Medicaid Other | Admitting: Family Medicine

## 2019-07-24 ENCOUNTER — Encounter: Payer: Self-pay | Admitting: Family Medicine

## 2019-07-24 ENCOUNTER — Other Ambulatory Visit: Payer: Self-pay

## 2019-07-24 DIAGNOSIS — E1169 Type 2 diabetes mellitus with other specified complication: Secondary | ICD-10-CM | POA: Diagnosis not present

## 2019-07-24 DIAGNOSIS — S6010XA Contusion of unspecified finger with damage to nail, initial encounter: Secondary | ICD-10-CM | POA: Insufficient documentation

## 2019-07-24 DIAGNOSIS — E785 Hyperlipidemia, unspecified: Secondary | ICD-10-CM

## 2019-07-24 DIAGNOSIS — S6010XS Contusion of unspecified finger with damage to nail, sequela: Secondary | ICD-10-CM

## 2019-07-24 DIAGNOSIS — R739 Hyperglycemia, unspecified: Secondary | ICD-10-CM

## 2019-07-24 NOTE — Assessment & Plan Note (Signed)
Trimmed splinted.  Suggested continuing to trim as slowly grows out

## 2019-07-24 NOTE — Patient Instructions (Signed)
Good to see you today!  Thanks for coming in.  Come back for a lab test in 1-2 months.  I will let you know the results in My Chart  If the base of the nail looks infected let me know  Keep the nail covered when you might hit it other wise leave it open

## 2019-07-24 NOTE — Assessment & Plan Note (Signed)
Improved able to take lipitor.  Will check labs in a month or so

## 2019-07-24 NOTE — Progress Notes (Signed)
    SUBJECTIVE:   CHIEF COMPLAINT / HPI:   NAIL PROBLEM Shut finger in car trunk in April.  Now nail is partially coming off.  Gets hung in hair etc and pulls. No redness or discharge  CHOLESTEROL Taking Q10 now and tolerating lipitor well.  No chest pain or sob  PERTINENT  PMH / PSH: hyperglycmeia  OBJECTIVE:   BP 112/62   Pulse 64   Ht 5\' 5"  (1.651 m)   Wt 153 lb (69.4 kg)   LMP 09/06/2013   SpO2 96%   BMI 25.46 kg/m   R thumbnail - partially separated from matrix medially.   Procedure - trimmed lose nail at angle and placed alumina foam splint  ASSESSMENT/PLAN:   Finger nail contusion Trimmed splinted.  Suggested continuing to trim as slowly grows out  Hyperlipidemia associated with type 2 diabetes mellitus ([place]) Improved able to take lipitor.  Will check labs in a month or so      Lind Covert, [place]

## 2019-08-28 DIAGNOSIS — R300 Dysuria: Secondary | ICD-10-CM | POA: Diagnosis not present

## 2019-09-10 DIAGNOSIS — G894 Chronic pain syndrome: Secondary | ICD-10-CM | POA: Diagnosis not present

## 2019-09-10 DIAGNOSIS — R102 Pelvic and perineal pain: Secondary | ICD-10-CM | POA: Diagnosis not present

## 2019-09-10 DIAGNOSIS — G8929 Other chronic pain: Secondary | ICD-10-CM | POA: Diagnosis not present

## 2019-09-10 DIAGNOSIS — N3010 Interstitial cystitis (chronic) without hematuria: Secondary | ICD-10-CM | POA: Diagnosis not present

## 2019-09-10 DIAGNOSIS — Z5181 Encounter for therapeutic drug level monitoring: Secondary | ICD-10-CM | POA: Diagnosis not present

## 2019-09-10 DIAGNOSIS — Z79899 Other long term (current) drug therapy: Secondary | ICD-10-CM | POA: Diagnosis not present

## 2019-09-24 ENCOUNTER — Encounter: Payer: Self-pay | Admitting: Family Medicine

## 2019-09-26 ENCOUNTER — Ambulatory Visit: Payer: Medicaid Other | Admitting: Family Medicine

## 2019-10-08 ENCOUNTER — Other Ambulatory Visit: Payer: Self-pay | Admitting: Family Medicine

## 2019-10-08 DIAGNOSIS — Z1231 Encounter for screening mammogram for malignant neoplasm of breast: Secondary | ICD-10-CM

## 2019-10-30 ENCOUNTER — Ambulatory Visit (INDEPENDENT_AMBULATORY_CARE_PROVIDER_SITE_OTHER): Payer: Medicaid Other | Admitting: Student in an Organized Health Care Education/Training Program

## 2019-10-30 ENCOUNTER — Other Ambulatory Visit: Payer: Self-pay

## 2019-10-30 VITALS — BP 105/80 | HR 84 | Ht 65.0 in | Wt 152.0 lb

## 2019-10-30 DIAGNOSIS — L304 Erythema intertrigo: Secondary | ICD-10-CM

## 2019-10-30 DIAGNOSIS — R739 Hyperglycemia, unspecified: Secondary | ICD-10-CM

## 2019-10-30 DIAGNOSIS — M8588 Other specified disorders of bone density and structure, other site: Secondary | ICD-10-CM

## 2019-10-30 DIAGNOSIS — E785 Hyperlipidemia, unspecified: Secondary | ICD-10-CM

## 2019-10-30 DIAGNOSIS — E1169 Type 2 diabetes mellitus with other specified complication: Secondary | ICD-10-CM | POA: Diagnosis not present

## 2019-10-30 DIAGNOSIS — E782 Mixed hyperlipidemia: Secondary | ICD-10-CM | POA: Diagnosis not present

## 2019-10-30 DIAGNOSIS — M8580 Other specified disorders of bone density and structure, unspecified site: Secondary | ICD-10-CM | POA: Insufficient documentation

## 2019-10-30 LAB — POCT GLYCOSYLATED HEMOGLOBIN (HGB A1C): HbA1c, POC (controlled diabetic range): 5.7 % (ref 0.0–7.0)

## 2019-10-30 MED ORDER — CLOTRIMAZOLE 1 % EX CREA: 1.0000 "application " | TOPICAL_CREAM | CUTANEOUS | 1 refills | Status: DC | PRN

## 2019-10-30 NOTE — Assessment & Plan Note (Signed)
Repeat lipid panel today - continue atorvastatin 40mg 

## 2019-10-30 NOTE — Assessment & Plan Note (Signed)
A1c 05/2019 was 6.0 and patient met pre-diabetic criteria but has been implementing lifestyle changes since then. Repeat A1c today.

## 2019-10-30 NOTE — Progress Notes (Signed)
SUBJECTIVE:   CHIEF COMPLAINT / HPI: Hgb A1c and lipid  Hyperlipidemia - has been adherent with atorvastatin and able to tolerate without side effects. Also continues to take CoQ10.  Hyperglycemia - consciously decreased sweet intake recently but endorses a pretty healthy diet regularly. Walks every day about a mile. Down 1 pound since last appointment 07/2019  Osteopenia - patient wants to discuss Dexa scan 05/2019 which showed T score -2.3 worsening from previous in 2015. She currently takes 2,0000u vitamin D daily and had normal level when checked 6 months ago. She also takes OTC calcium supplement of unknown dose daily. She takes them at different times of day as instructed.  She has h/o ankle and wrist fracture after low impact injury. She is curious about starting a bisphosphonate medication today.   inframamary fold rash- patient does not currently have the rash but states that she will occassionally get red and painful rash under bilateral breasts and this is associated with when she has a hot/sweaty activity. The rash resolves with a powder application. Requests prescription  OBJECTIVE:   BP 105/80   Pulse 84   Ht 5' 5" (1.651 m)   Wt 152 lb (68.9 kg)   LMP 09/06/2013   SpO2 96%   BMI 25.29 kg/m   General: NAD, pleasant, able to participate in exam Extremities: no edema or cyanosis. WWP. Skin: warm and dry, no rashes noted Neuro: alert and oriented x4, no focal deficits Psych: Normal affect and mood  ASSESSMENT/PLAN:   Hyperlipidemia Repeat lipid panel today - continue atorvastatin 40mg  Hyperglycemia A1c 05/2019 was 6.0 and patient met pre-diabetic criteria but has been implementing lifestyle changes since then. Repeat A1c today.  Osteopenia DEXA 05/2019 showed T score -2.3. patient already on vitamin D and calcium supplement with recent normal vitamin D level.  She is taking the supplements at appropriate time intervals.  Discussed with patient the indication for  bisphosphonate initiation and that she could consider at this time because of her fractures but we instead decided to implement lifestyle changes first.  - Provided patient with weight-bearing exercise document as well as demonstrated multiple exercises in office emphasizing on technique to prevent injury. Instructed patient to start with 2-3x/week and increase weight and frequency as tolerated. - continue to take supplements - repeat DEXA 05/2021  Intertrigo Patient does not currently have rash but describes a likely fungal infection under breasts intermittently.  - recommended techniques for prevention is the best treatment and prescribed topical clotrimazole for flares - return to PCP if not managed with current regimen     Chelsey L Anderson, DO  [facility]   

## 2019-10-30 NOTE — Patient Instructions (Addendum)
It was a pleasure to see you today!  To summarize our discussion for this visit:  We will be checking some blood work today to include your blood sugars, hgb A1c and cholesterol  Please continue to take your vitamin D and calcium supplements as well as add in some weight-bearing strengthening exercises to help increase bone density  For the rash under breasts, I am prescribing a topical antifungal for flares but the best treatment is prevention. Try to keep that area dry as much as possible  Some additional health maintenance measures we should update are: Health Maintenance Due  Topic Date Due  . DTAP VACCINES (1) Never done  . PNEUMOCOCCAL POLYSACCHARIDE VACCINE AGE 30-64 HIGH RISK  Never done  . FOOT EXAM  Never done  . OPHTHALMOLOGY EXAM  Never done  . URINE MICROALBUMIN  Never done  . COVID-19 Vaccine (1) Never done  . DTaP/Tdap/Td (1 - Tdap) Never done  . TETANUS/TDAP  Never done  . INFLUENZA VACCINE  09/16/2019  .   Call the clinic at [phone] if your symptoms worsen or you have any concerns.   Thank you for allowing me to take part in your care,  Dr. Doristine Mango

## 2019-10-30 NOTE — Assessment & Plan Note (Signed)
Patient does not currently have rash but describes a likely fungal infection under breasts intermittently.  - recommended techniques for prevention is the best treatment and prescribed topical clotrimazole for flares - return to PCP if not managed with current regimen

## 2019-10-30 NOTE — Assessment & Plan Note (Signed)
DEXA 05/2019 showed T score -2.3. patient already on vitamin D and calcium supplement with recent normal vitamin D level.  She is taking the supplements at appropriate time intervals.  Discussed with patient the indication for bisphosphonate initiation and that she could consider at this time because of her fractures but we instead decided to implement lifestyle changes first.  - Provided patient with weight-bearing exercise document as well as demonstrated multiple exercises in office emphasizing on technique to prevent injury. Instructed patient to start with 2-3x/week and increase weight and frequency as tolerated. - continue to take supplements - repeat DEXA 05/2021

## 2019-10-31 LAB — BASIC METABOLIC PANEL
BUN/Creatinine Ratio: 32 — ABNORMAL HIGH (ref 9–23)
BUN: 24 mg/dL (ref 6–24)
CO2: 24 mmol/L (ref 20–29)
Calcium: 9.6 mg/dL (ref 8.7–10.2)
Chloride: 102 mmol/L (ref 96–106)
Creatinine, Ser: 0.75 mg/dL (ref 0.57–1.00)
GFR calc Af Amer: 102 mL/min/{1.73_m2} (ref >59)
GFR calc non Af Amer: 88 mL/min/{1.73_m2} (ref >59)
Glucose: 99 mg/dL (ref 65–99)
Potassium: 4.7 mmol/L (ref 3.5–5.2)
Sodium: 140 mmol/L (ref 134–144)

## 2019-10-31 LAB — LIPID PANEL
Chol/HDL Ratio: 2.5 ratio (ref 0.0–4.4)
Cholesterol, Total: 147 mg/dL (ref 100–199)
HDL: 60 mg/dL (ref >39)
LDL Chol Calc (NIH): 77 mg/dL (ref 0–99)
Triglycerides: 43 mg/dL (ref 0–149)
VLDL Cholesterol Cal: 10 mg/dL (ref 5–40)

## 2019-11-05 DIAGNOSIS — R102 Pelvic and perineal pain: Secondary | ICD-10-CM | POA: Diagnosis not present

## 2019-11-05 DIAGNOSIS — G894 Chronic pain syndrome: Secondary | ICD-10-CM | POA: Diagnosis not present

## 2019-11-05 DIAGNOSIS — Z79899 Other long term (current) drug therapy: Secondary | ICD-10-CM | POA: Diagnosis not present

## 2019-11-05 DIAGNOSIS — G8929 Other chronic pain: Secondary | ICD-10-CM | POA: Diagnosis not present

## 2019-11-05 DIAGNOSIS — N3010 Interstitial cystitis (chronic) without hematuria: Secondary | ICD-10-CM | POA: Diagnosis not present

## 2019-11-05 DIAGNOSIS — Z5181 Encounter for therapeutic drug level monitoring: Secondary | ICD-10-CM | POA: Diagnosis not present

## 2019-11-09 ENCOUNTER — Ambulatory Visit: Payer: Medicaid Other

## 2019-11-15 ENCOUNTER — Ambulatory Visit
Admission: RE | Admit: 2019-11-15 | Discharge: 2019-11-15 | Disposition: A | Discharge status: Home / Self Care | Payer: Medicaid Other | Source: Ambulatory Visit | Attending: Family Medicine | Admitting: Family Medicine

## 2019-11-15 ENCOUNTER — Other Ambulatory Visit: Payer: Self-pay

## 2019-11-15 DIAGNOSIS — Z1231 Encounter for screening mammogram for malignant neoplasm of breast: Secondary | ICD-10-CM

## 2019-11-27 ENCOUNTER — Ambulatory Visit: Payer: Medicaid Other | Admitting: Family Medicine

## 2019-12-11 ENCOUNTER — Ambulatory Visit (INDEPENDENT_AMBULATORY_CARE_PROVIDER_SITE_OTHER): Payer: Medicaid Other | Admitting: Family Medicine

## 2019-12-11 ENCOUNTER — Encounter: Payer: Self-pay | Admitting: Family Medicine

## 2019-12-11 ENCOUNTER — Other Ambulatory Visit: Payer: Self-pay

## 2019-12-11 VITALS — BP 122/72 | HR 84

## 2019-12-11 DIAGNOSIS — R739 Hyperglycemia, unspecified: Secondary | ICD-10-CM | POA: Diagnosis not present

## 2019-12-11 DIAGNOSIS — G4700 Insomnia, unspecified: Secondary | ICD-10-CM | POA: Diagnosis not present

## 2019-12-11 DIAGNOSIS — Z23 Encounter for immunization: Secondary | ICD-10-CM | POA: Diagnosis not present

## 2019-12-11 DIAGNOSIS — R9230 Dense breasts, unspecified: Secondary | ICD-10-CM | POA: Insufficient documentation

## 2019-12-11 DIAGNOSIS — R922 Inconclusive mammogram: Secondary | ICD-10-CM | POA: Diagnosis not present

## 2019-12-11 DIAGNOSIS — E782 Mixed hyperlipidemia: Secondary | ICD-10-CM

## 2019-12-11 NOTE — Assessment & Plan Note (Signed)
Ordered MRI.  Will see if insurance will cover

## 2019-12-11 NOTE — Assessment & Plan Note (Signed)
Stable continue current medications

## 2019-12-11 NOTE — Assessment & Plan Note (Signed)
Max A1c 6.0  Last was improved

## 2019-12-11 NOTE — Patient Instructions (Addendum)
  I will let you know about the urine micro  We will see about the breast MRI  You need an diabetes eye exam every year.  Please see your eye doctor.  Ask them to fax [place] a report of your exam  Try a regular sleep schedule no more than 7.5-8 hrs in the bed Use the melatonin intermittently

## 2019-12-11 NOTE — Progress Notes (Signed)
    SUBJECTIVE:   CHIEF COMPLAINT / HPI:   DENSE BREASTS She would like an MRI to better detect cancer.  Her online risk calculator puts her at sligltly less than avg risk 1.2 with lifetime fo 6.7%.  Only family member is aunt with breast cancer in 80s.    INSOMNIA Wakes up frequently with her interstitial cystitis and now having trouble getting back to sleep.  Melatonin helped at first but seems to have worn off.  Currently trying to stay in bed 8-9 hours (12-8-9).  Some snoring but no apnea.  No naps.  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  Normal bun and crt.    HYPERGLYCEMIA Last A1c normal   PERTINENT  PMH / PSH: son finishing anesthesia residency and daughter in 2nd year MS at [employer].  Older son in medical sales   OBJECTIVE:   BP 122/72   Pulse 84   LMP 09/06/2013   SpO2 98%   Psych:  Cognition and judgment appear intact. Alert, communicative  and cooperative with normal attention span and concentration. No apparent delusions, illusions, hallucinations   ASSESSMENT/PLAN:   Dense breast tissue Ordered MRI.  Will see if insurance will cover   Hyperglycemia Max A1c 6.0  Last was improved   Hyperlipidemia Stable continue current medications   Insomnia Likely due to age related changes.  Discussed not being in bed more than 7.5-8 hrs and trying different sleep schedules and using melatonin intermittently      Lind Covert, [place]

## 2019-12-11 NOTE — Assessment & Plan Note (Signed)
Likely due to age related changes.  Discussed not being in bed more than 7.5-8 hrs and trying different sleep schedules and using melatonin intermittently

## 2019-12-12 LAB — MICROALBUMIN, URINE: Microalbumin, Urine: 4.6 ug/mL

## 2019-12-13 DIAGNOSIS — G894 Chronic pain syndrome: Secondary | ICD-10-CM | POA: Diagnosis not present

## 2019-12-13 DIAGNOSIS — R102 Pelvic and perineal pain: Secondary | ICD-10-CM | POA: Diagnosis not present

## 2019-12-19 ENCOUNTER — Encounter: Payer: Self-pay | Admitting: Family Medicine

## 2019-12-31 DIAGNOSIS — G894 Chronic pain syndrome: Secondary | ICD-10-CM | POA: Diagnosis not present

## 2019-12-31 DIAGNOSIS — N3010 Interstitial cystitis (chronic) without hematuria: Secondary | ICD-10-CM | POA: Diagnosis not present

## 2019-12-31 DIAGNOSIS — R102 Pelvic and perineal pain: Secondary | ICD-10-CM | POA: Diagnosis not present

## 2019-12-31 DIAGNOSIS — G8929 Other chronic pain: Secondary | ICD-10-CM | POA: Diagnosis not present

## 2020-01-02 DIAGNOSIS — H04123 Dry eye syndrome of bilateral lacrimal glands: Secondary | ICD-10-CM | POA: Diagnosis not present

## 2020-01-03 ENCOUNTER — Ambulatory Visit
Admission: RE | Admit: 2020-01-03 | Discharge: 2020-01-03 | Disposition: A | Discharge status: Home / Self Care | Payer: Medicaid Other | Source: Ambulatory Visit | Attending: Family Medicine | Admitting: Family Medicine

## 2020-01-03 ENCOUNTER — Other Ambulatory Visit: Payer: Self-pay

## 2020-01-03 DIAGNOSIS — H5213 Myopia, bilateral: Secondary | ICD-10-CM | POA: Diagnosis not present

## 2020-01-03 DIAGNOSIS — N6002 Solitary cyst of left breast: Secondary | ICD-10-CM | POA: Diagnosis not present

## 2020-01-03 DIAGNOSIS — R922 Inconclusive mammogram: Secondary | ICD-10-CM

## 2020-01-03 MED ORDER — GADOBUTROL 1 MMOL/ML IV SOLN
7.0000 mL | Freq: Once | INTRAVENOUS | Status: AC | PRN
  Administered 2020-01-03: 7 mL via INTRAVENOUS

## 2020-01-25 DIAGNOSIS — H5203 Hypermetropia, bilateral: Secondary | ICD-10-CM | POA: Diagnosis not present

## 2020-01-25 DIAGNOSIS — H52203 Unspecified astigmatism, bilateral: Secondary | ICD-10-CM | POA: Diagnosis not present

## 2020-03-04 DIAGNOSIS — N3010 Interstitial cystitis (chronic) without hematuria: Secondary | ICD-10-CM | POA: Diagnosis not present

## 2020-03-04 DIAGNOSIS — G894 Chronic pain syndrome: Secondary | ICD-10-CM | POA: Diagnosis not present

## 2020-03-04 DIAGNOSIS — R102 Pelvic and perineal pain: Secondary | ICD-10-CM | POA: Diagnosis not present

## 2020-03-04 DIAGNOSIS — G8929 Other chronic pain: Secondary | ICD-10-CM | POA: Diagnosis not present

## 2020-03-17 DIAGNOSIS — R102 Pelvic and perineal pain: Secondary | ICD-10-CM | POA: Diagnosis not present

## 2020-03-17 DIAGNOSIS — G894 Chronic pain syndrome: Secondary | ICD-10-CM | POA: Diagnosis not present

## 2020-03-17 DIAGNOSIS — G8929 Other chronic pain: Secondary | ICD-10-CM | POA: Diagnosis not present

## 2020-03-31 ENCOUNTER — Encounter: Payer: Self-pay | Admitting: Gastroenterology

## 2020-04-22 ENCOUNTER — Other Ambulatory Visit: Payer: Self-pay | Admitting: Family Medicine

## 2020-05-08 DIAGNOSIS — Z5181 Encounter for therapeutic drug level monitoring: Secondary | ICD-10-CM | POA: Diagnosis not present

## 2020-05-08 DIAGNOSIS — G894 Chronic pain syndrome: Secondary | ICD-10-CM | POA: Diagnosis not present

## 2020-05-08 DIAGNOSIS — Z79899 Other long term (current) drug therapy: Secondary | ICD-10-CM | POA: Diagnosis not present

## 2020-05-13 ENCOUNTER — Other Ambulatory Visit: Payer: Self-pay

## 2020-05-13 ENCOUNTER — Ambulatory Visit (AMBULATORY_SURGERY_CENTER): Payer: Self-pay

## 2020-05-13 VITALS — Ht 65.0 in | Wt 157.0 lb

## 2020-05-13 DIAGNOSIS — Z86010 Personal history of colonic polyps: Secondary | ICD-10-CM

## 2020-05-13 MED ORDER — GOLYTELY 236 G PO SOLR: 4000.0000 mL | ORAL | 0 refills | Status: DC

## 2020-05-13 NOTE — Progress Notes (Signed)
No egg or soy allergy known to patient  No issues with past sedation with any surgeries or procedures Patient denies ever being told they had issues or difficulty with intubation  No FH of Malignant Hyperthermia No diet pills per patient No home 02 use per patient  No blood thinners per patient  Pt reports issues with constipation --uses stools softener daily- will advise uses of Miralax daily for 5 days prior to prep starting No A fib or A flutter  EMMI video to pt or via [place] 19 guidelines implemented in [place] today with Pt and RN  Coupon given to pt in PV today, Code to Pharmacy and  NO PA's for preps discussed with pt in [facility] today  Discussed with pt there will be an out-of-pocket cost for prep and that varies from $0 to 70 dollars;  Due to the COVID-19 pandemic we are asking patients to follow certain guidelines.   Pt aware of COVID protocols and [facility] guidelines

## 2020-05-27 ENCOUNTER — Other Ambulatory Visit: Payer: Self-pay

## 2020-05-27 ENCOUNTER — Ambulatory Visit (AMBULATORY_SURGERY_CENTER): Payer: Medicaid Other | Admitting: Gastroenterology

## 2020-05-27 ENCOUNTER — Encounter: Payer: Self-pay | Admitting: Gastroenterology

## 2020-05-27 VITALS — BP 118/65 | HR 70 | Temp 97.5°F | Resp 12 | Ht 65.0 in | Wt 157.0 lb

## 2020-05-27 DIAGNOSIS — Z86010 Personal history of colonic polyps: Secondary | ICD-10-CM | POA: Diagnosis not present

## 2020-05-27 DIAGNOSIS — D124 Benign neoplasm of descending colon: Secondary | ICD-10-CM | POA: Diagnosis not present

## 2020-05-27 DIAGNOSIS — K635 Polyp of colon: Secondary | ICD-10-CM

## 2020-05-27 DIAGNOSIS — K639 Disease of intestine, unspecified: Secondary | ICD-10-CM | POA: Diagnosis not present

## 2020-05-27 DIAGNOSIS — D123 Benign neoplasm of transverse colon: Secondary | ICD-10-CM

## 2020-05-27 DIAGNOSIS — Z1211 Encounter for screening for malignant neoplasm of colon: Secondary | ICD-10-CM | POA: Diagnosis not present

## 2020-05-27 MED ORDER — PANTOPRAZOLE SODIUM 40 MG PO TBEC: 40.0000 mg | DELAYED_RELEASE_TABLET | Freq: Every day | ORAL | 3 refills | Status: DC

## 2020-05-27 MED ORDER — SODIUM CHLORIDE 0.9 % IV SOLN: 500.0000 mL | Freq: Once | INTRAVENOUS | Status: DC

## 2020-05-27 NOTE — Patient Instructions (Signed)
Handouts given for polyps and GERD.  No aspirin, ibuprofen, naproxen or aleve for 2 weeks after polyp removal.  You may take tylenol if needed.  Await pathology results.  Pick up your new stomach medicine.  YOU HAD AN ENDOSCOPIC PROCEDURE TODAY AT [facility]:   Refer to the procedure report that was given to you for any specific questions about what was found during the examination.  If the procedure report does not answer your questions, please call your gastroenterologist to clarify.  If you requested that your care partner not be given the details of your procedure findings, then the procedure report has been included in a sealed envelope for you to review at your convenience later.  YOU SHOULD EXPECT: Some feelings of bloating in the abdomen. Passage of more gas than usual.  Walking can help get rid of the air that was put into your GI tract during the procedure and reduce the bloating. If you had a lower endoscopy (such as a colonoscopy or flexible sigmoidoscopy) you may notice spotting of blood in your stool or on the toilet paper. If you underwent a bowel prep for your procedure, you may not have a normal bowel movement for a few days.  Please Note:  You might notice some irritation and congestion in your nose or some drainage.  This is from the oxygen used during your procedure.  There is no need for concern and it should clear up in a day or so.  SYMPTOMS TO REPORT IMMEDIATELY:   Following lower endoscopy (colonoscopy or flexible sigmoidoscopy):  Excessive amounts of blood in the stool  Significant tenderness or worsening of abdominal pains  Swelling of the abdomen that is new, acute  Fever of 100F or higher  For urgent or emergent issues, a gastroenterologist can be reached at any hour by calling [phone]. Do not use MyChart messaging for urgent concerns.    DIET:  We do recommend a small meal at first, but then you may proceed to your regular diet.   Drink plenty of fluids but you should avoid alcoholic beverages for 24 hours.  ACTIVITY:  You should plan to take it easy for the rest of today and you should NOT DRIVE or use heavy machinery until tomorrow (because of the sedation medicines used during the test).    FOLLOW UP: Our staff will call the number listed on your records 48-72 hours following your procedure to check on you and address any questions or concerns that you may have regarding the information given to you following your procedure. If we do not reach you, we will leave a message.  We will attempt to reach you two times.  During this call, we will ask if you have developed any symptoms of COVID 19. If you develop any symptoms (ie: fever, flu-like symptoms, shortness of breath, cough etc.) before then, please call [phone].  If you test positive for Covid 19 in the 2 weeks post procedure, please call and report this information to [place].    If any biopsies were taken you will be contacted by phone or by letter within the next 1-3 weeks.  Please call us at [phone] if you have not heard about the biopsies in 3 weeks.    SIGNATURES/CONFIDENTIALITY: You and/or your care partner have signed paperwork which will be entered into your electronic medical record.  These signatures attest to the fact that that the information above on your After Visit Summary has been reviewed  and is understood.  Full responsibility of the confidentiality of this discharge information lies with you and/or your care-partner. 

## 2020-05-27 NOTE — Progress Notes (Signed)
Called to room to assist during endoscopic procedure.  Patient ID and intended procedure confirmed with present staff. Received instructions for my participation in the procedure from the performing physician.  

## 2020-05-27 NOTE — Op Note (Addendum)
[place] Patient Name: Denise Aguirre Procedure Date: 05/27/2020 8:41 AM MRN: [id number] Endoscopist: Ladene Artist , MD Age: 60 Referring MD:  Date of Birth: 1960-10-04 Gender: Female Account #: [id number] Procedure:                Colonoscopy Indications:              Surveillance: Personal history of adenomatous                            polyps on last colonoscopy 5 years ago and prior                            history of sessile serrated adenomas Medicines:                Monitored Anesthesia Care Procedure:                Pre-Anesthesia Assessment:                           - Prior to the procedure, a History and Physical                            was performed, and patient medications and                            allergies were reviewed. The patient's tolerance of                            previous anesthesia was also reviewed. The risks                            and benefits of the procedure and the sedation                            options and risks were discussed with the patient.                            All questions were answered, and informed consent                            was obtained. Prior Anticoagulants: The patient has                            taken no previous anticoagulant or antiplatelet                            agents. ASA Grade Assessment: II - A patient with                            mild systemic disease. After reviewing the risks                            and benefits, the patient was deemed in  satisfactory condition to undergo the procedure.                           After obtaining informed consent, the colonoscope                            was passed under direct vision. Throughout the                            procedure, the patient's blood pressure, pulse, and                            oxygen saturations were monitored continuously. The                            Olympus CF-HQ190L  (Serial# [id number]) Colonoscope was                            introduced through the anus and advanced to the the                            terminal ileum, with identification of the                            appendiceal orifice and IC valve. The ileocecal                            valve, appendiceal orifice, and rectum were                            photographed. The quality of the bowel preparation                            was good. The colonoscopy was performed without                            difficulty. The patient tolerated the procedure                            well. Scope In: 8:49:43 AM Scope Out: 9:20:02 AM Scope Withdrawal Time: 0 hours 25 minutes 3 seconds  Total Procedure Duration: 0 hours 30 minutes 19 seconds  Findings:                 The perianal and digital rectal examinations were                            normal.                           A 25 mm polyp was found in the transverse colon.                            The polyp was sessile. The polyp was removed with a  cold snare. Resection and retrieval were complete.                           A 10 mm polyp was found in the descending colon.                            The polyp was sessile. The polyp was removed with a                            cold snare. Resection and retrieval were complete.                           The terminal ileum with white mucosa otherwise                            appeared normal.                           A localized area of white granular mucosa was found                            at the ileocecal valve. Biopsies were taken with a                            cold forceps for histology.                           The exam was otherwise without abnormality on                            direct and retroflexion views. Complications:            No immediate complications. Estimated blood loss:                            None. Estimated Blood Loss:     Estimated  blood loss: none. Impression:               - One 25 mm polyp in the transverse colon, removed                            with a cold snare. Resected and retrieved.                           - One 10 mm polyp in the descending colon, removed                            with a cold snare. Resected and retrieved.                           - The examined portion of the ileum with white                            mucosa otherwise was normal.                           -  White granular mucosa at the ileocecal valve.                            Biopsied.                           - The examination was otherwise normal on direct                            and retroflexion views. Recommendation:           - Repeat colonoscopy likely in 3 years for                            surveillance based on pathology results with an                            extended bowel prep.                           - Patient has a contact number available for                            emergencies. The signs and symptoms of potential                            delayed complications were discussed with the                            patient. Return to normal activities tomorrow.                            Written discharge instructions were provided to the                            patient.                           - Resume previous diet.                           - Antireflux measures.                           - Continue present medications.                           - Pantoprazole 40 mg po qam, 1 year of refills.                           - Await pathology results.                           - No aspirin, ibuprofen, naproxen, or other                            non-steroidal anti-inflammatory drugs for 2 weeks  after polyp removal.                           - GI office appt in 6 weeks. Ladene Artist, MD 05/27/2020 9:29:17 AM This report has been signed electronically.

## 2020-05-27 NOTE — Progress Notes (Signed)
PT taken to PACU. Monitors in place. VSS. Report given to RN. 

## 2020-05-29 ENCOUNTER — Telehealth: Payer: Self-pay | Admitting: *Deleted

## 2020-05-29 NOTE — Telephone Encounter (Signed)
  Follow up Call-  Call back number 05/27/2020 11/14/2018  Post procedure Call Back phone  # [phone]  Permission to leave phone message Yes Yes  Some recent data might be hidden    LMOM to call back with any questions or concerns.  Also, call back if patient has developed fever, respiratory issues or been dx with COVID or had any family members or close contacts diagnosed since her procedure.

## 2020-05-29 NOTE — Telephone Encounter (Signed)
Follow up call made. 

## 2020-06-10 ENCOUNTER — Encounter: Payer: Self-pay | Admitting: Gastroenterology

## 2020-06-10 DIAGNOSIS — R102 Pelvic and perineal pain: Secondary | ICD-10-CM | POA: Diagnosis not present

## 2020-06-10 DIAGNOSIS — G894 Chronic pain syndrome: Secondary | ICD-10-CM | POA: Diagnosis not present

## 2020-06-12 ENCOUNTER — Telehealth: Payer: Self-pay | Admitting: Gastroenterology

## 2020-06-12 ENCOUNTER — Other Ambulatory Visit: Payer: Self-pay | Admitting: Family Medicine

## 2020-06-12 DIAGNOSIS — R0989 Other specified symptoms and signs involving the circulatory and respiratory systems: Secondary | ICD-10-CM

## 2020-06-12 DIAGNOSIS — K219 Gastro-esophageal reflux disease without esophagitis: Secondary | ICD-10-CM

## 2020-06-12 DIAGNOSIS — R198 Other specified symptoms and signs involving the digestive system and abdomen: Secondary | ICD-10-CM

## 2020-06-12 NOTE — Telephone Encounter (Signed)
I reviewed the results with the patient and all questions answered .  Patient reports she is still having continued globus sensation after a few weeks of pantoprazole.  She states you and she discussed possible EGD.  Do you want to schedule direct or OV?

## 2020-06-12 NOTE — Telephone Encounter (Signed)
Patient calling to get Path results

## 2020-06-12 NOTE — Telephone Encounter (Signed)
Globus sensation might be from GERD or other nonGI causes. She underwent EGD in 10/2018 so not necessary to repeat EGD at this time. She needs a more intensive treatment for a possible GERD cause.   Increase pantoprazole to 40 mg po bid Closely follow antireflux measures  Schedule barium esophogram  Advise her to have her PCP refer her to ENT for other possible causes Reassess at REV with me or APP in 4-6 weeks

## 2020-06-13 MED ORDER — PANTOPRAZOLE SODIUM 40 MG PO TBEC: 40.0000 mg | DELAYED_RELEASE_TABLET | Freq: Two times a day (BID) | ORAL | 3 refills | Status: DC

## 2020-06-13 NOTE — Telephone Encounter (Signed)
Patient notified of recommendations and she verbalized understanding.  New rx sent Orders placed for BS.  She is aware she will be contacted by Central scheduling to arrange She needs to call back to schedule office visit, she is not home and does not have her calendar. She understands to call back to schedule 4-6 week follow up.

## 2020-07-07 ENCOUNTER — Other Ambulatory Visit: Payer: Self-pay

## 2020-07-07 ENCOUNTER — Ambulatory Visit (HOSPITAL_COMMUNITY)
Admission: RE | Admit: 2020-07-07 | Discharge: 2020-07-07 | Disposition: A | Discharge status: Home / Self Care | Payer: Medicaid Other | Source: Ambulatory Visit | Attending: Gastroenterology | Admitting: Gastroenterology

## 2020-07-07 DIAGNOSIS — K219 Gastro-esophageal reflux disease without esophagitis: Secondary | ICD-10-CM | POA: Insufficient documentation

## 2020-07-07 DIAGNOSIS — R198 Other specified symptoms and signs involving the digestive system and abdomen: Secondary | ICD-10-CM | POA: Diagnosis not present

## 2020-07-07 DIAGNOSIS — R0989 Other specified symptoms and signs involving the circulatory and respiratory systems: Secondary | ICD-10-CM

## 2020-07-08 ENCOUNTER — Telehealth: Payer: Self-pay | Admitting: Gastroenterology

## 2020-07-08 NOTE — Telephone Encounter (Signed)
Patient will come in to discuss EGD on 6/21.

## 2020-07-08 NOTE — Telephone Encounter (Signed)
Inbound call from patient. Stated she is taking the protonix and it is not working. Wants to discuss having the EDG as previous stated if the problems continued. Best contact number [phone]

## 2020-07-09 DIAGNOSIS — R102 Pelvic and perineal pain: Secondary | ICD-10-CM | POA: Diagnosis not present

## 2020-07-09 DIAGNOSIS — N3010 Interstitial cystitis (chronic) without hematuria: Secondary | ICD-10-CM | POA: Diagnosis not present

## 2020-07-09 DIAGNOSIS — G894 Chronic pain syndrome: Secondary | ICD-10-CM | POA: Diagnosis not present

## 2020-07-09 DIAGNOSIS — G8929 Other chronic pain: Secondary | ICD-10-CM | POA: Diagnosis not present

## 2020-07-29 ENCOUNTER — Telehealth: Payer: Self-pay | Admitting: Family Medicine

## 2020-07-29 NOTE — Telephone Encounter (Signed)
..  Patient declines further follow up and engagement by the Managed Medicaid Team. Appropriate care team members and provider have been notified via electronic communication. The Managed Medicaid Team is available to follow up with the patient after provider conversation with the patient regarding recommendation for engagement and subsequent re-referral to the Managed Medicaid Team.    Jennifer Alley Care Guide, High Risk Medicaid Managed Care Embedded Care Coordination [place]  [facility]   

## 2020-08-04 ENCOUNTER — Other Ambulatory Visit: Payer: Self-pay

## 2020-08-05 ENCOUNTER — Ambulatory Visit: Payer: Medicaid Other | Admitting: Gastroenterology

## 2020-08-05 DIAGNOSIS — Z20822 Contact with and (suspected) exposure to covid-19: Secondary | ICD-10-CM | POA: Diagnosis not present

## 2020-08-11 ENCOUNTER — Telehealth: Payer: Self-pay

## 2020-08-11 NOTE — Telephone Encounter (Signed)
Patient is scheduled for an appt in August. Dr. Fuller Plan informed me to contact patient and offer her a direct Upper Endoscopy. See phone note on 07/08/20. Patient scheduled for EGD on 10/10/20 at 10am in our [place]. Informed patient I will be putting the instructions and consent for her to sign in the mail. Also, informed patient to please mail back pre-procedure acknowledgement and consent after she has read and signed. Patient verbalized understanding. Informed patient this information will also be available on my chart to review.
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
## 2020-09-02 DIAGNOSIS — N3010 Interstitial cystitis (chronic) without hematuria: Secondary | ICD-10-CM | POA: Diagnosis not present

## 2020-09-02 DIAGNOSIS — Z79899 Other long term (current) drug therapy: Secondary | ICD-10-CM | POA: Diagnosis not present

## 2020-09-02 DIAGNOSIS — G8929 Other chronic pain: Secondary | ICD-10-CM | POA: Diagnosis not present

## 2020-09-02 DIAGNOSIS — R102 Pelvic and perineal pain: Secondary | ICD-10-CM | POA: Diagnosis not present

## 2020-09-02 DIAGNOSIS — Z5181 Encounter for therapeutic drug level monitoring: Secondary | ICD-10-CM | POA: Diagnosis not present

## 2020-09-02 DIAGNOSIS — G894 Chronic pain syndrome: Secondary | ICD-10-CM | POA: Diagnosis not present

## 2020-09-17 ENCOUNTER — Ambulatory Visit: Payer: Medicaid Other | Admitting: Gastroenterology

## 2020-10-10 ENCOUNTER — Other Ambulatory Visit: Payer: Self-pay

## 2020-10-10 ENCOUNTER — Encounter: Payer: Self-pay | Admitting: Gastroenterology

## 2020-10-10 ENCOUNTER — Ambulatory Visit (AMBULATORY_SURGERY_CENTER): Payer: Medicaid Other | Admitting: Gastroenterology

## 2020-10-10 VITALS — BP 142/81 | HR 80 | Temp 97.8°F | Resp 13 | Ht 65.0 in | Wt 153.0 lb

## 2020-10-10 DIAGNOSIS — K2970 Gastritis, unspecified, without bleeding: Secondary | ICD-10-CM

## 2020-10-10 DIAGNOSIS — K269 Duodenal ulcer, unspecified as acute or chronic, without hemorrhage or perforation: Secondary | ICD-10-CM

## 2020-10-10 DIAGNOSIS — K219 Gastro-esophageal reflux disease without esophagitis: Secondary | ICD-10-CM | POA: Diagnosis not present

## 2020-10-10 DIAGNOSIS — K319 Disease of stomach and duodenum, unspecified: Secondary | ICD-10-CM

## 2020-10-10 DIAGNOSIS — K229 Disease of esophagus, unspecified: Secondary | ICD-10-CM

## 2020-10-10 DIAGNOSIS — K208 Other esophagitis without bleeding: Secondary | ICD-10-CM

## 2020-10-10 DIAGNOSIS — R198 Other specified symptoms and signs involving the digestive system and abdomen: Secondary | ICD-10-CM | POA: Diagnosis not present

## 2020-10-10 DIAGNOSIS — K317 Polyp of stomach and duodenum: Secondary | ICD-10-CM | POA: Diagnosis not present

## 2020-10-10 DIAGNOSIS — R0989 Other specified symptoms and signs involving the circulatory and respiratory systems: Secondary | ICD-10-CM

## 2020-10-10 DIAGNOSIS — R09A2 Foreign body sensation, throat: Secondary | ICD-10-CM

## 2020-10-10 MED ORDER — SODIUM CHLORIDE 0.9 % IV SOLN: 500.0000 mL | Freq: Once | INTRAVENOUS | Status: DC

## 2020-10-10 NOTE — Progress Notes (Signed)
Called to room to assist during endoscopic procedure.  Patient ID and intended procedure confirmed with present staff. Received instructions for my participation in the procedure from the performing physician.  

## 2020-10-10 NOTE — Patient Instructions (Signed)
Await pathology results.  Resume previous diet.  Continue present medications including Pantoprazole 40 mg by mouth twice a day.   Avoid NSAIDS (Aspirin, Ibuprofen, Aleve, Naproxen), you may use Tylenol as needed.  Return to GI office in 2 months.   YOU HAD AN ENDOSCOPIC PROCEDURE TODAY AT [facility]:   Refer to the procedure report that was given to you for any specific questions about what was found during the examination.  If the procedure report does not answer your questions, please call your gastroenterologist to clarify.  If you requested that your care partner not be given the details of your procedure findings, then the procedure report has been included in a sealed envelope for you to review at your convenience later.  YOU SHOULD EXPECT: Some feelings of bloating in the abdomen. Passage of more gas than usual.  Walking can help get rid of the air that was put into your GI tract during the procedure and reduce the bloating. If you had a lower endoscopy (such as a colonoscopy or flexible sigmoidoscopy) you may notice spotting of blood in your stool or on the toilet paper. If you underwent a bowel prep for your procedure, you may not have a normal bowel movement for a few days.  Please Note:  You might notice some irritation and congestion in your nose or some drainage.  This is from the oxygen used during your procedure.  There is no need for concern and it should clear up in a day or so.  SYMPTOMS TO REPORT IMMEDIATELY:   Following upper endoscopy (EGD)  Vomiting of blood or coffee ground material  New chest pain or pain under the shoulder blades  Painful or persistently difficult swallowing  New shortness of breath  Fever of 100F or higher  Black, tarry-looking stools  For urgent or emergent issues, a gastroenterologist can be reached at any hour by calling [phone]. Do not use MyChart messaging for urgent concerns.    DIET:  We do recommend a small  meal at first, but then you may proceed to your regular diet.  Drink plenty of fluids but you should avoid alcoholic beverages for 24 hours.  ACTIVITY:  You should plan to take it easy for the rest of today and you should NOT DRIVE or use heavy machinery until tomorrow (because of the sedation medicines used during the test).    FOLLOW UP: Our staff will call the number listed on your records 48-72 hours following your procedure to check on you and address any questions or concerns that you may have regarding the information given to you following your procedure. If we do not reach you, we will leave a message.  We will attempt to reach you two times.  During this call, we will ask if you have developed any symptoms of COVID 19. If you develop any symptoms (ie: fever, flu-like symptoms, shortness of breath, cough etc.) before then, please call [phone].  If you test positive for Covid 19 in the 2 weeks post procedure, please call and report this information to [place].    If any biopsies were taken you will be contacted by phone or by letter within the next 1-3 weeks.  Please call us at [phone] if you have not heard about the biopsies in 3 weeks.    SIGNATURES/CONFIDENTIALITY: You and/or your care partner have signed paperwork which will be entered into your electronic medical record.  These signatures attest to the fact that that  the information above on your After Visit Summary has been reviewed and is understood.  Full responsibility of the confidentiality of this discharge information lies with you and/or your care-partner.

## 2020-10-10 NOTE — Op Note (Signed)
[facility] Patient Name: Denise Aguirre Procedure Date: 10/10/2020 9:52 AM MRN: [id number] Endoscopist: Ladene Artist , MD Age: 60 Referring MD:  Date of Birth: October 24, 1960 Gender: Female Account #: [id number] Procedure:                Upper GI endoscopy Indications:              Gastroesophageal reflux disease, Globus sensation Medicines:                Monitored Anesthesia Care Procedure:                Pre-Anesthesia Assessment:                           - Prior to the procedure, a History and Physical                            was performed, and patient medications and                            allergies were reviewed. The patient's tolerance of                            previous anesthesia was also reviewed. The risks                            and benefits of the procedure and the sedation                            options and risks were discussed with the patient.                            All questions were answered, and informed consent                            was obtained. Prior Anticoagulants: The patient has                            taken no previous anticoagulant or antiplatelet                            agents. ASA Grade Assessment: II - A patient with                            mild systemic disease. After reviewing the risks                            and benefits, the patient was deemed in                            satisfactory condition to undergo the procedure.                           After obtaining informed consent, the endoscope was  passed under direct vision. Throughout the                            procedure, the patient's blood pressure, pulse, and                            oxygen saturations were monitored continuously. The                            GIF [id number] was introduced through the                            mouth, and advanced to the second part of duodenum.                             The upper GI endoscopy was accomplished without                            difficulty. The patient tolerated the procedure                            well. Scope In: Scope Out: Findings:                 The Z-line was variable, 1.5 cm, and was found at                            the gastroesophageal junction. Biopsies were taken                            with a cold forceps for histology.                           The exam of the esophagus was otherwise normal.                           Multiple small sessile polyps with no bleeding and                            no stigmata of recent bleeding were found in the                            gastric fundus and in the gastric body. Typical                            appearance of small benign gastric fundic polyps                            which were previously biopsied.                           Patchy mildly erythematous mucosa without bleeding                            was  found in the entire examined stomach. Biopsies                            were taken with a cold forceps for histology.                           The exam of the stomach was otherwise normal.                           A single localized erosion without bleeding was                            found in the duodenal bulb.                           The exam of the duodenum was otherwise normal. Complications:            No immediate complications. Estimated Blood Loss:     Estimated blood loss was minimal. Impression:               - Z-line variable, at the gastroesophageal                            junction. Biopsied.                           - Multiple gastric polyps.                           - Erythematous mucosa in the stomach. Biopsied.                           - Duodenal erosion without bleeding. Recommendation:           - Patient has a contact number available for                            emergencies. The signs and symptoms of potential                             delayed complications were discussed with the                            patient. Return to normal activities tomorrow.                            Written discharge instructions were provided to the                            patient.                           - Resume previous diet.                           - Closely follow antireflux measures.                           -  Continue present medications including                            pantoprazole 40 mg po bid.                           - Avoid/minimize NSAIDs.                           - Await pathology results.                           - Return to GI office in 2 months. Ladene Artist, MD 10/10/2020 10:18:03 AM This report has been signed electronically.

## 2020-10-10 NOTE — Progress Notes (Signed)
History & Physical  Primary Care Physician:  Lind Covert, MD Primary Gastroenterologist: Jerilynn Mages. Fuller Plan, MD  CHIEF COMPLAINT: Globus sensation, GERD.  HPI: Denise Aguirre is a 60 y.o. female who presents for EGD to further evaluate worsening globus sensation and worsening reflux symptoms.  Symptoms are not well controlled on pantoprazole 40 mg p.o. twice daily.   Past Medical History:  Diagnosis Date   Allergy    seasonal allergies   Arthritis    bilateral hands   Cystitis, interstitial    chronic since 1999   Diverticulitis    GERD (gastroesophageal reflux disease)    Osteopenia    Osteoporosis    Other malaise and fatigue    Screening cholesterol level     Past Surgical History:  Procedure Laterality Date   COLONOSCOPY  2017   MS-MAC-adeq with suctioning and ext rinsing-TA x 1   dental biopsy     right buccal mucosa    INTERSTIM IMPLANT PLACEMENT     and removal in 2008   [place] Right    with plate    Prior to Admission medications   Medication Sig Start Date End Date Taking? Authorizing Provider  atorvastatin (LIPITOR) 40 MG tablet TAKE 1 TABLET(40 MG) BY MOUTH DAILY 04/22/20  Yes Chambliss, Jeb Levering, MD  Bioflavonoid Products (BIOFLEX PO) Take 1 tablet by mouth daily.   Yes [provider]  Calcium Carb-Cholecalciferol 600-200 MG-UNIT TABS Take 2 tablets by mouth daily.   Yes [provider]  co-enzyme Q-10 50 MG capsule Take 50 mg by mouth daily.   Yes [provider]  Docusate Calcium (STOOL SOFTENER PO) Take 2 capsules by mouth at bedtime.   Yes [provider]  methadone (DOLOPHINE) 5 MG tablet Take 5 mg by mouth 2 (two) times daily.   Yes [provider]  Omega-3 Fatty Acids (FISH OIL PO) Take 2 capsules by mouth daily.    Yes [provider]  pantoprazole (PROTONIX)  40 MG tablet Take 1 tablet (40 mg total) by mouth 2 (two) times daily. Take one every morning. 06/13/20  Yes Ladene Artist, MD  cetirizine (ZYRTEC) 10 MG tablet TAKE 1 TABLET(10 MG) BY MOUTH DAILY Patient not taking: Reported on 10/10/2020 06/13/20   Lind Covert, MD  clotrimazole (LOTRIMIN) 1 % cream Apply 1 application topically as needed. Patient not taking: No sig reported 10/30/19   Richarda Osmond, MD  naloxone [facility]) nasal spray 4 mg/0.1 mL SMARTSIG:1 Spray(s) Both Nares Daily PRN Patient not taking: Reported on 10/10/2020 05/23/20   [provider]    Current Outpatient Medications  Medication Sig Dispense Refill   atorvastatin (LIPITOR) 40 MG tablet TAKE 1 TABLET(40 MG) BY MOUTH DAILY 90 tablet 3   Bioflavonoid Products (BIOFLEX PO) Take 1 tablet by mouth daily.     Calcium Carb-Cholecalciferol 600-200 MG-UNIT TABS Take 2 tablets by mouth daily.     co-enzyme Q-10 50 MG capsule Take 50 mg by mouth daily.     Docusate Calcium (STOOL SOFTENER PO) Take 2 capsules by mouth at bedtime.     methadone (DOLOPHINE) 5 MG tablet Take 5 mg by mouth 2 (two) times daily.     Omega-3 Fatty Acids (FISH OIL PO) Take 2 capsules by mouth daily.  pantoprazole (PROTONIX) 40 MG tablet Take 1 tablet (40 mg total) by mouth 2 (two) times daily. Take one every morning. 180 tablet 3   cetirizine (ZYRTEC) 10 MG tablet TAKE 1 TABLET(10 MG) BY MOUTH DAILY (Patient not taking: Reported on 10/10/2020) 30 tablet 11   clotrimazole (LOTRIMIN) 1 % cream Apply 1 application topically as needed. (Patient not taking: No sig reported) 30 g 1   naloxone (NARCAN) nasal spray 4 mg/0.1 mL SMARTSIG:1 Spray(s) Both Nares Daily PRN (Patient not taking: Reported on 10/10/2020)     Current Facility-Administered Medications  Medication Dose Route Frequency Provider Last Rate Last Admin   0.9 %  sodium chloride infusion  500 mL Intravenous Once Ladene Artist, MD        Allergies as of 10/10/2020 - Review  Complete 10/10/2020  Allergen Reaction Noted   Ivp dye [iodinated diagnostic agents] Rash 12/21/2010   Topiramate Nausea And Vomiting 06/19/2015    Family History  Problem Relation Age of Onset   Stroke Father    Skin cancer Maternal Grandmother    Colon cancer Neg Hx    Esophageal cancer Neg Hx    Pancreatic cancer Neg Hx    Stomach cancer Neg Hx    Liver disease Neg Hx    Colon polyps Neg Hx    Rectal cancer Neg Hx     Social History   Socioeconomic History   Marital status: Married    Spouse name: Not on file   Number of children: Not on file   Years of education: Not on file   Highest education level: Not on file  Occupational History   Not on file  Tobacco Use   Smoking status: Former    Types: Cigarettes    Quit date: 11/22/1985    Years since quitting: 34.9   Smokeless tobacco: Never  Vaping Use   Vaping Use: Never used  Substance and Sexual Activity   Alcohol use: No   Drug use: No   Sexual activity: Not on file  Other Topics Concern   Not on file  Social History Narrative   Not on file   Social Determinants of Health   Financial Resource Strain: Not on file  Food Insecurity: Not on file  Transportation Needs: Not on file  Physical Activity: Not on file  Stress: Not on file  Social Connections: Not on file  Intimate Partner Violence: Not on file    Review of Systems:  All systems reviewed an negative except where noted in HPI.  Gen: Denies any fever, chills, sweats, anorexia, fatigue, weakness, malaise, weight loss, and sleep disorder CV: Denies chest pain, angina, palpitations, syncope, orthopnea, PND, peripheral edema, and claudication. Resp: Denies dyspnea at rest, dyspnea with exercise, cough, sputum, wheezing, coughing up blood, and pleurisy. GI: Denies vomiting blood, jaundice, and fecal incontinence.   Denies dysphagia or odynophagia. GU : Denies urinary burning, blood in urine, urinary frequency, urinary hesitancy, nocturnal urination,  and urinary incontinence. MS: Denies joint pain, limitation of movement, and swelling, stiffness, low back pain, extremity pain. Denies muscle weakness, cramps, atrophy.  Derm: Denies rash, itching, dry skin, hives, moles, warts, or unhealing ulcers.  Psych: Denies depression, anxiety, memory loss, suicidal ideation, hallucinations, paranoia, and confusion. Heme: Denies bruising, bleeding, and enlarged lymph nodes. Neuro:  Denies any headaches, dizziness, paresthesias. Endo:  Denies any problems with DM, thyroid, adrenal function.   Physical Exam: General:  Alert, well-developed, in NAD Head:  Normocephalic and atraumatic. Eyes:  Sclera clear,  no icterus.   Conjunctiva pink. Ears:  Normal auditory acuity. Mouth:  No deformity or lesions.  Neck:  Supple; no masses . Lungs:  Clear throughout to auscultation.   No wheezes, crackles, or rhonchi. No acute distress. Heart:  Regular rate and rhythm; no murmurs. Abdomen:  Soft, nondistended, nontender. No masses, hepatomegaly. No obvious masses.  Normal bowel .    Rectal:  Deferred   Msk:  Symmetrical without gross deformities.. Pulses:  Normal pulses noted. Extremities:  Without edema. Neurologic:  Alert and  oriented x4;  grossly normal neurologically. Skin:  Intact without significant lesions or rashes. Cervical Nodes:  No significant cervical adenopathy. Psych:  Alert and cooperative. Normal mood and affect.    Impression / Plan:   Worsening GERD and worsening globus sensation for EGD to further evaluate.   This patient is appropriate for endoscopic procedures in the ambulatory setting.    Pricilla Riffle. Fuller Plan  10/10/2020, 9:59 AM

## 2020-10-10 NOTE — Progress Notes (Signed)
Pt in recovery with monitors in place, VSS. Report given to receiving RN. Bite guard was placed with pt awake to ensure comfort. No dental or soft tissue damage noted. 

## 2020-10-10 NOTE — Progress Notes (Signed)
Pt's states no medical or surgical changes since previsit or office visit. VS assessed by C.W 

## 2020-10-13 ENCOUNTER — Other Ambulatory Visit: Payer: Self-pay | Admitting: Family Medicine

## 2020-10-13 DIAGNOSIS — Z1231 Encounter for screening mammogram for malignant neoplasm of breast: Secondary | ICD-10-CM

## 2020-10-14 ENCOUNTER — Telehealth: Payer: Self-pay | Admitting: *Deleted

## 2020-10-14 NOTE — Telephone Encounter (Signed)
  Follow up Call-  Call back number 10/10/2020 05/27/2020 11/14/2018  Post procedure Call Back phone  # [phone]  Permission to leave phone message Yes Yes Yes  Some recent data might be hidden     Patient questions:  Do you have a fever, pain , or abdominal swelling? No. Pain Score  0 *  Have you tolerated food without any problems? Yes.    Have you been able to return to your normal activities? Yes.    Do you have any questions about your discharge instructions: Diet   No. Medications  No. Follow up visit  No.  Do you have questions or concerns about your Care? No.  Actions: * If pain score is 4 or above: No action needed, pain <4.  Have you developed a fever since your procedure? no  2.   Have you had an respiratory symptoms (SOB or cough) since your procedure? no  3.   Have you tested positive for COVID 19 since your procedure no  4.   Have you had any family members/close contacts diagnosed with the COVID 19 since your procedure?  no   If yes to any of these questions please route to Joylene John, RN and Joella Prince, RN

## 2020-10-14 NOTE — Telephone Encounter (Signed)
No answer for post procedure call back. Left VM. 

## 2020-10-17 ENCOUNTER — Ambulatory Visit: Payer: Medicaid Other | Admitting: Family Medicine

## 2020-10-24 ENCOUNTER — Telehealth: Payer: Self-pay | Admitting: Gastroenterology

## 2020-10-24 ENCOUNTER — Encounter: Payer: Self-pay | Admitting: Gastroenterology

## 2020-10-24 NOTE — Telephone Encounter (Signed)
Pt called inquiring about path results that are already available in Mychart. She is aware that she will be contacted as soons as Dr. Fuller Plan reviews them.

## 2020-10-24 NOTE — Telephone Encounter (Signed)
Patient notified how to look at her results in [place].  I reviewed with her that the pathology was benign, and the Dr.Stark will be sending her a letter soon.

## 2020-10-30 DIAGNOSIS — R7303 Prediabetes: Secondary | ICD-10-CM | POA: Insufficient documentation

## 2020-10-30 NOTE — Progress Notes (Signed)
  SUBJECTIVE:   CHIEF COMPLAINT / HPI:   Hyperlipidemia: Takes atorvastatin '40mg'$  daily and CoQ-10. No complaints or concerns.  Prediabetes: Last A1c on 10/30/19 5.7%. Does not eat breakfast, egg whites and vegetables with pita chips or soup for lunch. Salad and chicken for dinner. Exercises frequently but not much at this time as she is having family trouble. Her husband recently had quadruple bypass for his heart.   PERTINENT  PMH / PSH: HLD, prediabetes, osteopenia  OBJECTIVE:   BP 107/78   Pulse 72   Ht '5\' 5"'$  (1.651 m)   Wt 151 lb (68.5 kg)   LMP 09/06/2013   SpO2 100%   BMI 25.13 kg/m    General: NAD, pleasant, able to participate in exam Cardiac: RRR, no murmurs. Respiratory: CTAB, normal effort, No wheezes, rales or rhonchi  ASSESSMENT/PLAN:  Prediabetes A1c 5.7 today. Her diet and exercise are appropriate. Addressed concerns of developing diabetes with patient and encouraged her that she is doing well. Recheck A1c in 6 months.  Hyperlipidemia Compliant with medications.  Lipid panel and BMP today.  Encounter for vaccination Patient opted to receive flu vaccine today.   Return in about 6 months (around 04/30/2021) for Prediabetes follow-up.   Wells Guiles, [place]

## 2020-10-31 ENCOUNTER — Ambulatory Visit: Payer: Medicaid Other | Admitting: Student

## 2020-10-31 ENCOUNTER — Encounter: Payer: Self-pay | Admitting: Student

## 2020-10-31 ENCOUNTER — Other Ambulatory Visit: Payer: Self-pay

## 2020-10-31 VITALS — BP 107/78 | HR 72 | Ht 65.0 in | Wt 151.0 lb

## 2020-10-31 DIAGNOSIS — E785 Hyperlipidemia, unspecified: Secondary | ICD-10-CM

## 2020-10-31 DIAGNOSIS — Z23 Encounter for immunization: Secondary | ICD-10-CM | POA: Diagnosis not present

## 2020-10-31 DIAGNOSIS — R7303 Prediabetes: Secondary | ICD-10-CM

## 2020-10-31 LAB — POCT GLYCOSYLATED HEMOGLOBIN (HGB A1C): HbA1c, POC (controlled diabetic range): 5.7 % (ref 0.0–7.0)

## 2020-10-31 NOTE — Assessment & Plan Note (Signed)
A1c 5.7 today. Her diet and exercise are appropriate. Addressed concerns of developing diabetes with patient and encouraged her that she is doing well. Recheck A1c in 6 months.

## 2020-10-31 NOTE — Patient Instructions (Signed)
It was great to see you today! Thank you for choosing [facility] for your primary care. Denise Aguirre was seen for blood work for hyperlipidemia and prediabetes.  Our plans for today were:  -Prediabetes: As we discussed, I believe you are doing excellent with your diet and exercise routine.  Please continue and we will recheck your A1c in 6 months. -Hyperlipidemia: Atorvastatin compliant with Denise Aguirre medications.  We will be checking your lipid panel and electrolytes today.  Thank you for fasting for these labs. -He received the flu vaccination today.  As we discussed, I encourage all patients to get the vaccinations when they are able.  Please return another time for your COVID-vaccine and Tdap.  We are checking some labs today. If they are abnormal, I will call you. If they are normal, I will send you a MyChart message or a letter. If you do not hear about your labs in the next 2 weeks, please let us know.  You should return to our clinic in 6 months for prediabetes follow-up.   I recommend that you always bring your medications to each appointment as this makes it easy to ensure you are on the correct medications and helps [place] not miss refills when you need them.  Please arrive 15 minutes before your appointment to ensure smooth check in process.  We appreciate your efforts in making this happen.  Take care and seek immediate care sooner if you develop any concerns.   Thank you for allowing me to participate in your care, Wells Guiles, DO 10/31/2020, 2:11 PM PGY-1, [place]

## 2020-10-31 NOTE — Assessment & Plan Note (Signed)
Compliant with medications.  Lipid panel and BMP today.

## 2020-10-31 NOTE — Assessment & Plan Note (Signed)
Patient opted to receive flu vaccine today.

## 2020-11-01 LAB — LIPID PANEL
Chol/HDL Ratio: 2.3 ratio (ref 0.0–4.4)
Cholesterol, Total: 152 mg/dL (ref 100–199)
HDL: 67 mg/dL (ref >39)
LDL Chol Calc (NIH): 76 mg/dL (ref 0–99)
Triglycerides: 40 mg/dL (ref 0–149)
VLDL Cholesterol Cal: 9 mg/dL (ref 5–40)

## 2020-11-01 LAB — BASIC METABOLIC PANEL
BUN/Creatinine Ratio: 21 (ref 9–23)
BUN: 16 mg/dL (ref 6–24)
CO2: 26 mmol/L (ref 20–29)
Calcium: 9.6 mg/dL (ref 8.7–10.2)
Chloride: 101 mmol/L (ref 96–106)
Creatinine, Ser: 0.78 mg/dL (ref 0.57–1.00)
Glucose: 98 mg/dL (ref 65–99)
Potassium: 4.8 mmol/L (ref 3.5–5.2)
Sodium: 140 mmol/L (ref 134–144)
eGFR: 87 mL/min/{1.73_m2} (ref >59)

## 2020-11-10 DIAGNOSIS — G8929 Other chronic pain: Secondary | ICD-10-CM | POA: Diagnosis not present

## 2020-11-10 DIAGNOSIS — R102 Pelvic and perineal pain: Secondary | ICD-10-CM | POA: Diagnosis not present

## 2020-11-10 DIAGNOSIS — G894 Chronic pain syndrome: Secondary | ICD-10-CM | POA: Diagnosis not present

## 2020-11-10 DIAGNOSIS — N3010 Interstitial cystitis (chronic) without hematuria: Secondary | ICD-10-CM | POA: Diagnosis not present

## 2020-11-13 ENCOUNTER — Telehealth: Payer: Self-pay | Admitting: *Deleted

## 2020-11-13 NOTE — Telephone Encounter (Signed)
Patient is calling to see if an order to get her vitamins tested.  She had requested it at the last visit but it wasn't performed or ordered.  Patient would like a call when these future orders have been placed so she can make a lab appt.  Will forward to MD. Denise Aguirre

## 2020-11-14 ENCOUNTER — Encounter: Payer: Self-pay | Admitting: Family Medicine

## 2020-11-14 DIAGNOSIS — Z1321 Encounter for screening for nutritional disorder: Secondary | ICD-10-CM

## 2020-11-14 DIAGNOSIS — E559 Vitamin D deficiency, unspecified: Secondary | ICD-10-CM

## 2020-11-14 DIAGNOSIS — M8588 Other specified disorders of bone density and structure, other site: Secondary | ICD-10-CM

## 2020-11-14 NOTE — Telephone Encounter (Signed)
Sent mychart message

## 2020-11-20 ENCOUNTER — Other Ambulatory Visit: Payer: Medicaid Other

## 2020-11-20 ENCOUNTER — Other Ambulatory Visit: Payer: Self-pay

## 2020-11-20 ENCOUNTER — Ambulatory Visit
Admission: RE | Admit: 2020-11-20 | Discharge: 2020-11-20 | Disposition: A | Discharge status: Home / Self Care | Payer: Medicaid Other | Source: Ambulatory Visit | Attending: Family Medicine | Admitting: Family Medicine

## 2020-11-20 DIAGNOSIS — Z1231 Encounter for screening mammogram for malignant neoplasm of breast: Secondary | ICD-10-CM | POA: Diagnosis not present

## 2020-12-03 ENCOUNTER — Ambulatory Visit (INDEPENDENT_AMBULATORY_CARE_PROVIDER_SITE_OTHER): Payer: Medicaid Other

## 2020-12-03 ENCOUNTER — Encounter: Payer: Self-pay | Admitting: Podiatry

## 2020-12-03 ENCOUNTER — Ambulatory Visit (INDEPENDENT_AMBULATORY_CARE_PROVIDER_SITE_OTHER): Payer: Medicaid Other | Admitting: Podiatry

## 2020-12-03 ENCOUNTER — Other Ambulatory Visit: Payer: Self-pay

## 2020-12-03 DIAGNOSIS — L84 Corns and callosities: Secondary | ICD-10-CM

## 2020-12-03 DIAGNOSIS — D169 Benign neoplasm of bone and articular cartilage, unspecified: Secondary | ICD-10-CM

## 2020-12-03 DIAGNOSIS — M21619 Bunion of unspecified foot: Secondary | ICD-10-CM | POA: Diagnosis not present

## 2020-12-03 NOTE — Progress Notes (Signed)
Subjective:   Patient ID: Denise Aguirre, female   DOB: 60 y.o.   MRN: [id number]   HPI Patient presents stating concerned about discoloration of the left hallux nail and the right fourth nail and also does have lesions on the big toes which can be bothersome and discomforting.  Patient does not smoke and is active   Review of Systems  All other systems reviewed and are negative.      Objective:  Physical Exam Vitals and nursing note reviewed.  Constitutional:      Appearance: She is well-developed.  Pulmonary:     Effort: Pulmonary effort is normal.  Musculoskeletal:        General: Normal range of motion.  Skin:    General: Skin is warm.  Neurological:     Mental Status: She is alert.    Neurovascular status was found to be intact muscle strength was found to be adequate range of motion adequate patient found to have discoloration of the left hallux nail distal two thirds along with discoloration of adjacent nailbeds with patient found to have good digital perfusion well oriented x3 and also keratotic lesion hallux bilateral with moderate bunion left over right     Assessment:  Probability for nail trauma versus fungus currently along with lesion secondary to structural deformity with keratotic lesion medial side hallux right over left     Plan:  H&P x-rays reviewed all conditions discussed and do not recommend current treatment with debridement accomplished today and she can have this done at pedicures facility.  I did discuss that hopefully the nails will grow out normal but since they do not hurt I do not recommend current treatment and educated her on this also along with structural bunions and consideration for surgery at 1 point in future  X-rays indicate there is structural bunion deformity left over right moderate abnormal appearance with no other pathology noted

## 2020-12-04 ENCOUNTER — Other Ambulatory Visit: Payer: Medicaid Other

## 2020-12-04 DIAGNOSIS — E559 Vitamin D deficiency, unspecified: Secondary | ICD-10-CM

## 2020-12-04 DIAGNOSIS — Z1321 Encounter for screening for nutritional disorder: Secondary | ICD-10-CM | POA: Diagnosis not present

## 2020-12-04 DIAGNOSIS — M8588 Other specified disorders of bone density and structure, other site: Secondary | ICD-10-CM | POA: Diagnosis not present

## 2020-12-05 LAB — FSH/LH
FSH: 79.3 m[IU]/mL
LH: 30.8 m[IU]/mL

## 2020-12-05 LAB — B12 AND FOLATE PANEL
Folate: 10.5 ng/mL (ref >3.0)
Vitamin B-12: 393 pg/mL (ref 232–1245)

## 2020-12-05 LAB — VITAMIN D 25 HYDROXY (VIT D DEFICIENCY, FRACTURES): Vit D, 25-Hydroxy: 34.2 ng/mL (ref 30.0–100.0)

## 2020-12-05 LAB — MAGNESIUM: Magnesium: 2.2 mg/dL (ref 1.6–2.3)

## 2021-01-15 DIAGNOSIS — M25571 Pain in right ankle and joints of right foot: Secondary | ICD-10-CM | POA: Diagnosis not present

## 2021-01-15 DIAGNOSIS — M25572 Pain in left ankle and joints of left foot: Secondary | ICD-10-CM | POA: Diagnosis not present

## 2021-01-15 DIAGNOSIS — S93402A Sprain of unspecified ligament of left ankle, initial encounter: Secondary | ICD-10-CM | POA: Diagnosis not present

## 2021-01-19 NOTE — Progress Notes (Signed)
    SUBJECTIVE:   CHIEF COMPLAINT / HPI:   Ear feels clogged 60 year old female presenting for the above.  States that her right ear has been muffled for a few days and she is afraid it is blocked with earwax.  States that her left ear feels normal.  She denies pain.  Nasal pain: Denies nosebleeds but states that she feels some pain inside her nose at the very tip and feels as if there is a "bubble" or raised lesion in the area.  States that she sometimes has some allergies in the spring.  She thinks this is been there for a few days at least.  PERTINENT  PMH / PSH: None relevant  OBJECTIVE:   BP 128/73   Pulse 71   Ht 5\' 5"  (1.651 m)   LMP 09/06/2013   SpO2 98%   BMI 25.13 kg/m    General: NAD, pleasant, able to participate in exam HEENT: Left auditory canal is about 40% blocked by cerumen but tympanic membrane appears normal, right auditory canal with impacted cerumen blocking 100% of the canal.  Right nare with small scab present inside the nose suggestive of previous trauma to the area.  Left nare with small abrasion which is nonbleeding present.  She has some congestion present in nares bilaterally. Respiratory: No respiratory distress Neuro: alert, no obvious focal deficits Psych: Normal affect and mood  ASSESSMENT/PLAN:    Impacted cerumen: Full impacted cerumen of the auditory canal of the right ear with about 40% blocked on the left.  We will attempt to irrigate.  If unsuccessful will have patient do Debrox and follow-up in a few days.  On recheck of the auditory canal the right canal is mostly clear with tympanic membrane normal on appearance, left auditory canal still has a minor amount of wax present which does not appear impacted.  She endorses her hearing is much improved of the right.  She is not having complaints or symptoms of the left side.  Discussed that she can try some Debrox to try to loosen it up if she starts having any difficulty hearing or muffled sounds  from the left.  Nasal abrasion: Right nare with small scab at the tip of the nostril on the inside suggestive of previous trauma which is in the state of healing, left nare with an abrasion present which appears recent.  No bleeding present.  Some congestion is present.  I discussed with her that treating for allergies can be helpful that this is likely due from trauma such as blowing the nose frequently and that if this was due to a virus infection then it should improve over the next few days but if she has ongoing congestion that treatment for allergies may improve her symptoms.  Lurline Del, [place]

## 2021-01-20 ENCOUNTER — Encounter: Payer: Self-pay | Admitting: Family Medicine

## 2021-01-20 ENCOUNTER — Ambulatory Visit: Payer: Medicaid Other | Admitting: Family Medicine

## 2021-01-20 ENCOUNTER — Other Ambulatory Visit: Payer: Self-pay

## 2021-01-20 VITALS — BP 128/73 | HR 71 | Ht 65.0 in

## 2021-01-20 DIAGNOSIS — H6121 Impacted cerumen, right ear: Secondary | ICD-10-CM | POA: Diagnosis not present

## 2021-01-26 DIAGNOSIS — Z79899 Other long term (current) drug therapy: Secondary | ICD-10-CM | POA: Diagnosis not present

## 2021-01-26 DIAGNOSIS — N3010 Interstitial cystitis (chronic) without hematuria: Secondary | ICD-10-CM | POA: Diagnosis not present

## 2021-01-26 DIAGNOSIS — G8929 Other chronic pain: Secondary | ICD-10-CM | POA: Diagnosis not present

## 2021-01-26 DIAGNOSIS — R102 Pelvic and perineal pain: Secondary | ICD-10-CM | POA: Diagnosis not present

## 2021-01-26 DIAGNOSIS — Z5181 Encounter for therapeutic drug level monitoring: Secondary | ICD-10-CM | POA: Diagnosis not present

## 2021-01-27 DIAGNOSIS — S93402A Sprain of unspecified ligament of left ankle, initial encounter: Secondary | ICD-10-CM | POA: Diagnosis not present

## 2021-02-18 DIAGNOSIS — S93402A Sprain of unspecified ligament of left ankle, initial encounter: Secondary | ICD-10-CM | POA: Diagnosis not present

## 2021-02-19 ENCOUNTER — Telehealth: Payer: Self-pay

## 2021-02-19 NOTE — Telephone Encounter (Signed)
Left message for patient to call back  

## 2021-02-19 NOTE — Progress Notes (Signed)
CARDIOLOGY CONSULT NOTE       Patient ID: Denise Aguirre MRN: [id number] DOB/AGE: 61-14-1962 61 y.o.  Admit date: (Not on file) Referring Physician: Sienna [place] Primary Physician: Lind Covert, MD Primary Cardiologist: New Reason for Consultation: Chest pain  Active Problems:   * No active hospital problems. *   HPI:  61 y.o. referred by Dr Brooke Dare for chest pain History of Pre diabetes A1c 5.7 and HLD on lipitor She has seen Dr [facility] for worsening reflux and globus sensation despite Rx with pantoprazole EGD 10/10/20 with some duodenal erosions and gastric polyps  Patient stated that she wants to be seen due to having chest pain. Patient referred herself. Patient stated that her husband had no history of CAD and his test had been fine, and he ended up getting a CABG x 3. Patient stated when she felt the chest pain, that it made her think of her husband and she would like to get a cardiac CT scan. Patient stated her chest pain feels like a pressure on her left side that comes and goes during rest and activity that has been going on for a couple of months. Patient stated sometimes her heart is racing at the time, but not always. Patient denies any SOB or any other symptoms.  She tripped recently and hit her chest She has muscular/bone pain in sternum   She has a daughter in medical school [place] Son just finished his anesthesia residency in [place] in [place] does medical sales  Her husband ended up having His CABG in [place]   Shared decision making [place] cardiac CTA with calcium score best test to risk stratify She had some hives with contrast over 30 years ago   ROS All other systems reviewed and negative except as noted above  Past Medical History:  Diagnosis Date   Allergy    seasonal allergies   Arthritis    bilateral hands   Cystitis, interstitial    chronic since 1999   Diverticulitis    GERD (gastroesophageal reflux disease)     Osteopenia    Osteoporosis    Other malaise and fatigue    Screening cholesterol level     Family History  Problem Relation Age of Onset   Stroke Father    Skin cancer Maternal Grandmother    Colon cancer Neg Hx    Esophageal cancer Neg Hx    Pancreatic cancer Neg Hx    Stomach cancer Neg Hx    Liver disease Neg Hx    Colon polyps Neg Hx    Rectal cancer Neg Hx     Social History   Socioeconomic History   Marital status: Married    Spouse name: Not on file   Number of children: Not on file   Years of education: Not on file   Highest education level: Not on file  Occupational History   Not on file  Tobacco Use   Smoking status: Former    Types: Cigarettes    Quit date: 11/22/1985    Years since quitting: 35.3   Smokeless tobacco: Never  Vaping Use   Vaping Use: Never used  Substance and Sexual Activity   Alcohol use: No   Drug use: No   Sexual activity: Not on file  Other Topics Concern   Not on file  Social History Narrative   Not on file   Social Determinants of Health   Financial Resource Strain: Not on file  Food Insecurity: Not  on file  Transportation Needs: Not on file  Physical Activity: Not on file  Stress: Not on file  Social Connections: Not on file  Intimate Partner Violence: Not on file    Past Surgical History:  Procedure Laterality Date   COLONOSCOPY  2017   MS-MAC-adeq with suctioning and ext rinsing-TA x 1   dental biopsy     right buccal mucosa    INTERSTIM IMPLANT PLACEMENT     and removal in 2008   [place] Right    with plate      Current Outpatient Medications:    atorvastatin (LIPITOR) 40 MG tablet, TAKE 1 TABLET(40 MG) BY MOUTH DAILY, Disp: 90 tablet, Rfl: 3   Bioflavonoid Products (BIOFLEX PO), Take 1 tablet by mouth daily., Disp: , Rfl:    Calcium Carb-Cholecalciferol 600-200 MG-UNIT TABS, Take 2  tablets by mouth daily., Disp: , Rfl:    co-enzyme Q-10 50 MG capsule, Take 50 mg by mouth daily., Disp: , Rfl:    Docusate Calcium (STOOL SOFTENER PO), Take 2 capsules by mouth at bedtime., Disp: , Rfl:    methadone (DOLOPHINE) 5 MG tablet, Take 5 mg by mouth 2 (two) times daily., Disp: , Rfl:    naloxone (NARCAN) nasal spray 4 mg/0.1 mL, SMARTSIG:1 Spray(s) Both Nares Daily PRN (Patient not taking: Reported on 10/10/2020), Disp: , Rfl:    Omega-3 Fatty Acids (FISH OIL PO), Take 2 capsules by mouth daily. , Disp: , Rfl:    pantoprazole (PROTONIX) 40 MG tablet, Take 1 tablet (40 mg total) by mouth 2 (two) times daily. Take one every morning., Disp: 180 tablet, Rfl: 3  Current Facility-Administered Medications:    0.9 %  sodium chloride infusion, 500 mL, Intravenous, Once, Ladene Artist, MD   sodium chloride      Physical Exam: Last menstrual period 09/06/2013.    Affect appropriate Healthy:  appears stated age 61: normal Neck supple with no adenopathy JVP normal no bruits no thyromegaly Lungs clear with no wheezing and good diaphragmatic motion Heart:  S1/S2 no murmur, no rub, gallop or click PMI normal Abdomen: benighn, BS positve, no tenderness, no AAA no bruit.  No HSM or HJR Distal pulses intact with no bruits No edema Neuro non-focal Skin warm and dry No muscular weakness   Labs:   Lab Results  Component Value Date   WBC 11.5 (H) 10/27/2018   HGB 13.6 10/27/2018   HCT 40.7 10/27/2018   MCV 88.7 10/27/2018   PLT 255 10/27/2018   No results for input(s): NA, K, CL, CO2, BUN, CREATININE, CALCIUM, PROT, BILITOT, ALKPHOS, ALT, AST, GLUCOSE in the last 168 hours.  Invalid input(s): LABALBU Lab Results  Component Value Date   IBBCWUG 89 11/28/2018   TROPONINI <0.01        NO INDICATION OF MYOCARDIAL INJURY. 08/15/2007    Lab Results  Component Value Date   CHOL 152 10/31/2020   CHOL 147 10/30/2019   CHOL 254 (H) 05/02/2019   Lab Results  Component Value  Date   HDL 67 10/31/2020   HDL 60 10/30/2019   HDL 63 05/02/2019   Lab Results  Component Value Date   LDLCALC 76 10/31/2020   LDLCALC 77 10/30/2019   LDLCALC 180 (H) 05/02/2019   Lab Results  Component Value Date  TRIG 40 10/31/2020   TRIG 43 10/30/2019   TRIG 65 05/02/2019   Lab Results  Component Value Date   CHOLHDL 2.3 10/31/2020   CHOLHDL 2.5 10/30/2019   CHOLHDL 4.0 05/02/2019   No results found for: LDLDIRECT    Radiology: No results found.  EKG: SR rate 86 normal    ASSESSMENT AND PLAN:   Chest Pain: mostly atypical Patient concern given husbands asymptomatic state before CABG Cardiac CTA Pre medicate for contrast allergy ECG is normal  HLD:  continue statin LDL 77 Pre DM:  low carb diet A1c 5.7  GERD:  post EGD benign on protonix f/u [facility]  Calcium score / Cardiac CTA Lopressor 100 mg 2 hours before scan BMET today  Contrast Pre Med protocol   F/U PRN if CTA normal   Signed: Jenkins Rouge 03/04/2021, 3:42 PM

## 2021-02-20 NOTE — Telephone Encounter (Signed)
Left second message for patient to call back.

## 2021-02-20 NOTE — Telephone Encounter (Signed)
Patient stated that she wants to be seen due to having chest pain. Patient referred herself. Patient stated that her husband had no history of CAD and his test had been fine, and he ended up getting a CABG x 3. Patient stated when she felt the chest pain, that it made her think of her husband and she would like to get a cardiac CT scan. Patient stated her chest pain feels like a pressure on her left side that comes and goes during rest and activity that has been going on for a couple of months. Patient stated sometimes her heart is racing at the time, but not always. Patient denies any SOB or any other symptoms. Encouraged patient to keep her appointment with Dr. Johnsie Cancel and will send message to him for further advisement.

## 2021-03-04 ENCOUNTER — Other Ambulatory Visit: Payer: Self-pay

## 2021-03-04 ENCOUNTER — Ambulatory Visit (INDEPENDENT_AMBULATORY_CARE_PROVIDER_SITE_OTHER): Payer: Medicaid Other | Admitting: Cardiovascular Disease

## 2021-03-04 ENCOUNTER — Encounter: Payer: Self-pay | Admitting: Cardiovascular Disease

## 2021-03-04 VITALS — BP 136/80 | HR 86 | Ht 65.0 in | Wt 150.0 lb

## 2021-03-04 DIAGNOSIS — R072 Precordial pain: Secondary | ICD-10-CM

## 2021-03-04 MED ORDER — DIPHENHYDRAMINE HCL 50 MG PO TABS: ORAL_TABLET | ORAL | 0 refills | Status: DC

## 2021-03-04 MED ORDER — METOPROLOL TARTRATE 100 MG PO TABS: ORAL_TABLET | ORAL | 0 refills | Status: DC

## 2021-03-04 MED ORDER — PREDNISONE 50 MG PO TABS: ORAL_TABLET | ORAL | 0 refills | Status: DC

## 2021-03-04 NOTE — Patient Instructions (Addendum)
Medication Instructions:  *If you need a refill on your cardiac medications before your next appointment, please call your pharmacy*  Lab Work: Your physician recommends that you have lab work today- BMET If you have labs (blood work) drawn today and your tests are completely normal, you will receive your results only by: MyChart Message (if you have MyChart) OR A paper copy in the mail If you have any lab test that is abnormal or we need to change your treatment, we will call you to review the results.  Testing/Procedures: Your physician has requested that you have cardiac CT. Cardiac computed tomography (CT) is a painless test that uses an x-ray machine to take clear, detailed pictures of your heart. For further information please visit [email or web address]. Please follow instruction sheet as given.  Follow-Up: At [facility], you and your health needs are our priority.  As part of our continuing mission to provide you with exceptional heart care, we have created designated Provider Care Teams.  These Care Teams include your primary Cardiologist (physician) and Advanced Practice Providers (APPs -  Physician Assistants and Nurse Practitioners) who all work together to provide you with the care you need, when you need it.  We recommend signing up for the patient portal called "MyChart".  Sign up information is provided on this After Visit Summary.  MyChart is used to connect with patients for Virtual Visits (Telemedicine).  Patients are able to view lab/test results, encounter notes, upcoming appointments, etc.  Non-urgent messages can be sent to your provider as well.   To learn more about what you can do with MyChart, go to [email or web address].    Your next appointment:   As needed  The format for your next appointment:   In Person  Provider:   Jenkins Rouge, MD {  Other Instructions   Your cardiac CT will be scheduled at one of the below locations:   [facility] [address] [place], [place] [address] [phone]   If scheduled at [facility], please arrive at the [facility] main entrance (entrance A) of [facility] 30 minutes prior to test start time. You can use the FREE valet parking offered at the main entrance (encouraged to control the heart rate for the test) Proceed to the [facility] Radiology Department (first floor) to check-in and test prep.  Please follow these instructions carefully (unless otherwise directed):  On the Night Before the Test: Be sure to Drink plenty of water. Do not consume any caffeinated/decaffeinated beverages or chocolate 12 hours prior to your test. Do not take any antihistamines 12 hours prior to your test. If the patient has contrast allergy: Patient will need a prescription for Prednisone and very clear instructions (as follows): Prednisone 50 mg - take 13 hours prior to test Take another Prednisone 50 mg 7 hours prior to test Take another Prednisone 50 mg 1 hour prior to test Take Benadryl 50 mg 1 hour prior to test Patient must complete all four doses of above prophylactic medications. Patient will need a ride after test due to Benadryl.  On the Day of the Test: Drink plenty of water until 1 hour prior to the test. Do not eat any food 4 hours prior to the test. You may take your regular medications prior to the test.  Take metoprolol (Lopressor) 100 mg two hours prior to test. FEMALES- please wear underwire-free bra if available, avoid dresses & tight clothing      After the Test:  Drink plenty of water. After receiving IV contrast, you may experience a mild flushed feeling. This is normal. On occasion, you may experience a mild rash up to 24 hours after the test. This is not dangerous. If this occurs, you can take Benadryl 25 mg and increase your fluid intake. If you experience trouble breathing, this can be serious. If it is severe call [phone] IMMEDIATELY. If it is mild, please call  our office.   We will call to schedule your test 2-4 weeks out understanding that some insurance companies will need an authorization prior to the service being performed.   For non-scheduling related questions, please contact the cardiac imaging nurse navigator should you have any questions/concerns: Marchia Bond, Cardiac Imaging Nurse Navigator Gordy Clement, Cardiac Imaging Nurse Navigator [facility] Direct Office Dial: [phone]   For scheduling needs, including cancellations and rescheduling, please call [place], [phone].

## 2021-03-05 ENCOUNTER — Other Ambulatory Visit: Payer: Self-pay | Admitting: Cardiovascular Disease

## 2021-03-05 DIAGNOSIS — M180 Bilateral primary osteoarthritis of first carpometacarpal joints: Secondary | ICD-10-CM | POA: Diagnosis not present

## 2021-03-05 DIAGNOSIS — M25532 Pain in left wrist: Secondary | ICD-10-CM | POA: Diagnosis not present

## 2021-03-05 DIAGNOSIS — M1812 Unilateral primary osteoarthritis of first carpometacarpal joint, left hand: Secondary | ICD-10-CM | POA: Diagnosis not present

## 2021-03-05 DIAGNOSIS — M1811 Unilateral primary osteoarthritis of first carpometacarpal joint, right hand: Secondary | ICD-10-CM | POA: Diagnosis not present

## 2021-03-05 DIAGNOSIS — M25531 Pain in right wrist: Secondary | ICD-10-CM | POA: Diagnosis not present

## 2021-03-05 LAB — BASIC METABOLIC PANEL
BUN/Creatinine Ratio: 26 (ref 12–28)
BUN: 20 mg/dL (ref 8–27)
CO2: 22 mmol/L (ref 20–29)
Calcium: 9.5 mg/dL (ref 8.7–10.3)
Chloride: 105 mmol/L (ref 96–106)
Creatinine, Ser: 0.77 mg/dL (ref 0.57–1.00)
Glucose: 140 mg/dL — ABNORMAL HIGH (ref 70–99)
Potassium: 4.5 mmol/L (ref 3.5–5.2)
Sodium: 143 mmol/L (ref 134–144)
eGFR: 88 mL/min/{1.73_m2} (ref >59)

## 2021-03-13 ENCOUNTER — Telehealth (HOSPITAL_COMMUNITY): Payer: Self-pay | Admitting: *Deleted

## 2021-03-13 NOTE — Telephone Encounter (Signed)
Reaching out to patient to offer assistance regarding upcoming cardiac imaging study; pt verbalizes understanding of appt date/time, parking situation and where to check in, pre-test NPO status and medications ordered, and verified current allergies; name and call back number provided for further questions should they arise  Gordy Clement RN Navigator Cardiac Imaging [facility] [phone] office [phone] cell  Reviewed instructions on how to take  13hr prep and metoprolol tartrate for test with patient. She verbalized understanding and is aware to arrive at 9am for her 9:30am scan.

## 2021-03-13 NOTE — Telephone Encounter (Signed)
Attempted to call patient regarding upcoming cardiac CT appointment. °Left message on voicemail with name and callback number ° °Jody Aguinaga RN Navigator Cardiac Imaging °[facility] °[phone] Office °[phone] Cell ° °

## 2021-03-16 ENCOUNTER — Other Ambulatory Visit: Payer: Self-pay | Admitting: Cardiology

## 2021-03-16 ENCOUNTER — Other Ambulatory Visit: Payer: Self-pay

## 2021-03-16 ENCOUNTER — Ambulatory Visit (HOSPITAL_COMMUNITY)
Admission: RE | Admit: 2021-03-16 | Discharge: 2021-03-16 | Disposition: A | Discharge status: Home / Self Care | Payer: Medicaid Other | Source: Ambulatory Visit | Attending: Cardiovascular Disease | Admitting: Cardiovascular Disease

## 2021-03-16 ENCOUNTER — Encounter (HOSPITAL_COMMUNITY): Payer: Self-pay

## 2021-03-16 DIAGNOSIS — R931 Abnormal findings on diagnostic imaging of heart and coronary circulation: Secondary | ICD-10-CM

## 2021-03-16 DIAGNOSIS — I2510 Atherosclerotic heart disease of native coronary artery without angina pectoris: Secondary | ICD-10-CM | POA: Diagnosis not present

## 2021-03-16 DIAGNOSIS — R072 Precordial pain: Secondary | ICD-10-CM

## 2021-03-16 MED ORDER — NITROGLYCERIN 0.4 MG SL SUBL
0.8000 mg | SUBLINGUAL_TABLET | Freq: Once | SUBLINGUAL | Status: AC
  Administered 2021-03-16: 0.8 mg via SUBLINGUAL

## 2021-03-16 MED ORDER — IOHEXOL 350 MG/ML SOLN
95.0000 mL | Freq: Once | INTRAVENOUS | Status: AC | PRN
  Administered 2021-03-16: 95 mL via INTRAVENOUS

## 2021-03-16 MED ORDER — NITROGLYCERIN 0.4 MG SL SUBL
SUBLINGUAL_TABLET | SUBLINGUAL | Status: AC
  Filled 2021-03-16: qty 2

## 2021-03-17 ENCOUNTER — Ambulatory Visit (HOSPITAL_COMMUNITY)
Admission: RE | Admit: 2021-03-17 | Discharge: 2021-03-17 | Disposition: A | Discharge status: Home / Self Care | Payer: Medicaid Other | Source: Ambulatory Visit | Attending: Cardiology | Admitting: Cardiology

## 2021-03-17 ENCOUNTER — Telehealth: Payer: Self-pay | Admitting: Cardiovascular Disease

## 2021-03-17 DIAGNOSIS — R931 Abnormal findings on diagnostic imaging of heart and coronary circulation: Secondary | ICD-10-CM | POA: Insufficient documentation

## 2021-03-17 NOTE — Telephone Encounter (Signed)
Called patient back with results.

## 2021-03-17 NOTE — Telephone Encounter (Signed)
Patient is returning call to discuss CT results. 

## 2021-03-23 DIAGNOSIS — N3010 Interstitial cystitis (chronic) without hematuria: Secondary | ICD-10-CM | POA: Diagnosis not present

## 2021-03-23 DIAGNOSIS — R102 Pelvic and perineal pain: Secondary | ICD-10-CM | POA: Diagnosis not present

## 2021-03-23 DIAGNOSIS — G8929 Other chronic pain: Secondary | ICD-10-CM | POA: Diagnosis not present

## 2021-03-26 DIAGNOSIS — M25572 Pain in left ankle and joints of left foot: Secondary | ICD-10-CM | POA: Diagnosis not present

## 2021-03-27 DIAGNOSIS — M25572 Pain in left ankle and joints of left foot: Secondary | ICD-10-CM | POA: Diagnosis not present

## 2021-04-06 ENCOUNTER — Encounter: Payer: Self-pay | Admitting: Podiatry

## 2021-04-06 ENCOUNTER — Encounter: Payer: Self-pay | Admitting: Family Medicine

## 2021-04-06 ENCOUNTER — Ambulatory Visit: Payer: Medicaid Other | Admitting: Podiatry

## 2021-04-06 ENCOUNTER — Telehealth: Payer: Self-pay

## 2021-04-06 ENCOUNTER — Other Ambulatory Visit: Payer: Self-pay

## 2021-04-06 DIAGNOSIS — M7752 Other enthesopathy of left foot: Secondary | ICD-10-CM | POA: Diagnosis not present

## 2021-04-06 DIAGNOSIS — M7751 Other enthesopathy of right foot: Secondary | ICD-10-CM | POA: Diagnosis not present

## 2021-04-06 MED ORDER — COVID-19 AT HOME ANTIGEN TEST VI KIT: PACK | 0 refills | Status: DC

## 2021-04-06 MED ORDER — COVID-19 HOME COLLECTION TEST VI KIT: PACK | 0 refills | Status: DC

## 2021-04-06 MED ORDER — METHYLPREDNISOLONE 4 MG PO TBPK: ORAL_TABLET | ORAL | 0 refills | Status: DC

## 2021-04-06 MED ORDER — TRIAMCINOLONE ACETONIDE 10 MG/ML IJ SUSP
20.0000 mg | Freq: Once | INTRAMUSCULAR | Status: AC
  Administered 2021-04-06: 20 mg

## 2021-04-06 NOTE — Progress Notes (Signed)
Subjective:   Patient ID: Denise Aguirre, female   DOB: 61 y.o.   MRN: [id number]   HPI Patient presents with chronic discomfort in the big toe joint of both feet states its been bothering her for a while and its only been over the last couple weeks it is really been noticeable.  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

## 2021-04-06 NOTE — Telephone Encounter (Signed)
Patient returns call to nurse. Patient reports her daughter was visiting and informed her she has covid.   Patient would like for her insurance to cover a home test vs buying one over the counter.   Please advise.

## 2021-04-06 NOTE — Telephone Encounter (Signed)
Patient LVM on nurse line regarding request for prescription for COVID test. Patient is requesting that home COVID test be sent to [facility] in [place].   Attempted to call patient, as typically these are OTC. Patient did not pick up, LVM for patient to return call to office.   Talbot Grumbling, RN

## 2021-04-07 ENCOUNTER — Ambulatory Visit (HOSPITAL_COMMUNITY): Payer: Medicaid Other

## 2021-04-28 ENCOUNTER — Other Ambulatory Visit: Payer: Self-pay | Admitting: *Deleted

## 2021-04-28 MED ORDER — ATORVASTATIN CALCIUM 40 MG PO TABS: 40.0000 mg | ORAL_TABLET | Freq: Every day | ORAL | 0 refills | Status: DC

## 2021-05-10 ENCOUNTER — Other Ambulatory Visit: Payer: Self-pay | Admitting: Gastroenterology

## 2021-05-20 ENCOUNTER — Telehealth: Payer: Self-pay | Admitting: Podiatry

## 2021-05-20 NOTE — Telephone Encounter (Signed)
Patient wants to know how often can she get the injections in her feet? Please advise .

## 2021-05-21 NOTE — Telephone Encounter (Signed)
Generally every 6 months

## 2021-05-25 NOTE — Telephone Encounter (Signed)
Left message on patient voicemail that she can get the injections generally every 6 months

## 2021-05-25 NOTE — Telephone Encounter (Signed)
Patient called back to get message:  I let her know that the injections are done generally every 6 months. She would like to know why so long in between injections?  Will it damage the tendon?

## 2021-05-25 NOTE — Telephone Encounter (Signed)
We like spread of time between injections to prevent any damage

## 2021-06-01 DIAGNOSIS — G8929 Other chronic pain: Secondary | ICD-10-CM | POA: Diagnosis not present

## 2021-06-01 DIAGNOSIS — Z5181 Encounter for therapeutic drug level monitoring: Secondary | ICD-10-CM | POA: Diagnosis not present

## 2021-06-01 DIAGNOSIS — N3010 Interstitial cystitis (chronic) without hematuria: Secondary | ICD-10-CM | POA: Diagnosis not present

## 2021-06-01 DIAGNOSIS — R102 Pelvic and perineal pain: Secondary | ICD-10-CM | POA: Diagnosis not present

## 2021-06-01 DIAGNOSIS — Z79899 Other long term (current) drug therapy: Secondary | ICD-10-CM | POA: Diagnosis not present

## 2021-06-16 ENCOUNTER — Encounter: Payer: Self-pay | Admitting: Family Medicine

## 2021-06-16 ENCOUNTER — Ambulatory Visit (INDEPENDENT_AMBULATORY_CARE_PROVIDER_SITE_OTHER): Payer: Medicaid Other | Admitting: Family Medicine

## 2021-06-16 VITALS — BP 120/80 | HR 67 | Ht 65.0 in | Wt 153.8 lb

## 2021-06-16 DIAGNOSIS — Z23 Encounter for immunization: Secondary | ICD-10-CM | POA: Insufficient documentation

## 2021-06-16 DIAGNOSIS — H1010 Acute atopic conjunctivitis, unspecified eye: Secondary | ICD-10-CM

## 2021-06-16 DIAGNOSIS — R7303 Prediabetes: Secondary | ICD-10-CM | POA: Diagnosis not present

## 2021-06-16 DIAGNOSIS — E785 Hyperlipidemia, unspecified: Secondary | ICD-10-CM

## 2021-06-16 LAB — POCT GLYCOSYLATED HEMOGLOBIN (HGB A1C): HbA1c, POC (controlled diabetic range): 5.9 % (ref 0.0–7.0)

## 2021-06-16 MED ORDER — ZOSTER VAC RECOMB ADJUVANTED 50 MCG/0.5ML IM SUSR: INTRAMUSCULAR | 1 refills | Status: DC

## 2021-06-16 MED ORDER — OLOPATADINE HCL 0.2 % OP SOLN: OPHTHALMIC | 4 refills | Status: DC

## 2021-06-16 NOTE — Assessment & Plan Note (Addendum)
Continue current nutritional supplementation and monitor bone density. ?

## 2021-06-16 NOTE — Assessment & Plan Note (Signed)
Given the patient's peripheral conjunctival injections and timeline of allergy symptoms aligning with these findings, it is likely that she is experiencing allergic conjunctivitis. Lack of iris involvement and lack of vision changes are reassuring findings. Recommended Pataday eye drops daily and switching to regular Claritin instead of occasional Zyrtec. Provided return precautions in case vision changes, significant pain, or noticeable drainage occur. ?

## 2021-06-16 NOTE — Progress Notes (Signed)
? ? ?SUBJECTIVE:  ? ?CHIEF COMPLAINT / HPI:  ? ?Denise Aguirre is a 61 y.o. female presenting to the clinic for an annual checkup and noting recent onset eye redness. ? ?1. Prediabetes ?The patient has continued to exercise by walking around her neighborhood and various parks. However, she was not as active for a few months this year while her ankle sprain healed. She is now returning to her previous level of exercise and is motivated to stay moving. She describes her diet as balanced, avoiding fried foods and including plenty of leafy vegetables. She reports that she stays away from high sugar content foods. The patient denies polyuria, peripheral neuropathy, or vision changes. ? ?2. Hyperlipidemia, unspecified hyperlipidemia type ?The patient's previous labs on 10/31/2020 were unremarkable and the patient feels comfortable following up with more labs in September of this year. She reports no adverse effects from her atorvastatin and continues to take it consistently without missing doses. ? ?3. Need for shingles vaccine ?The patient has not yet gotten her Shingrix vaccine and after discussion is interested in getting this vaccine. ? ?4. Seasonal allergic conjunctivitis ?For the past 2-3 months, the patient has noticed that both of her eyes are red, itchy, and dry. At the same time, she has been having nasal congestion, sneezing, and some rhinorrhea. She states that she takes Zyrtec on sometimes and it helps a bit. She has not previously tried Flonase or any kind of eye drops. She denies fevers, chills, cough, sore throat, sinus headaches, vision changes, eye discharge, and diplopia. ? ?5. Osteopenia ?The patient continues to take her vitamin D and calcium supplements, alternating them with different meals during the day. She has had an ankle sprain in the last year, but no new fractures. ? ?PERTINENT  PMH / PSH:  ? ?Current Meds - Reviewed Today  ?Medication Sig  ? atorvastatin (LIPITOR) 40 MG tablet  Take 1 tablet (40 mg total) by mouth daily.  ? Bioflavonoid Products (BIOFLEX PO) Take 1 tablet by mouth daily.  ? Calcium Carb-Cholecalciferol 600-200 MG-UNIT TABS Take 2 tablets by mouth daily.  ? co-enzyme Q-10 50 MG capsule Take 50 mg by mouth daily.  ? methadone (DOLOPHINE) 5 MG tablet Take 5 mg by mouth 2 (two) times daily.  ? pantoprazole (PROTONIX) 40 MG tablet TAKE 1 TABLET(40 MG) BY MOUTH EVERY MORNING  ? ?OBJECTIVE:  ? ?BP 120/80   Pulse 67   Ht '5\' 5"'$  (1.651 m)   Wt 153 lb 12.8 oz (69.8 kg)   LMP 09/06/2013   SpO2 94%   BMI 25.59 kg/m?   ?General: Age-appropriate, resting comfortably in chair, NAD, WNWD, alert and at baseline. ?HEENT: NCAT. PERRLA. Peripheral conjunctival injections bilaterally. No icterus. MMM. ?Neck: Supple. No LAD, thyroid smooth and not palpable. ?Cardiovascular: Regular rate and rhythm. Normal S1/S2. No murmurs, rubs, or gallops appreciated. 2+ radial pulses. ?Pulmonary: Clear bilaterally to ascultation. No increased WOB, no accessory muscle usage. No wheezes, rales, or crackles. ?Abdominal: Normoactive bowel sounds. No tenderness to deep or light palpation. No rebound or guarding. No HSM. ?Extremities: No peripheral edema ?Psych: Pleasant and appropriate  ? ?Results for orders placed or performed in visit on 06/16/21 (from the past 24 hour(s))  ?HgB A1c     Status: None  ? Collection Time: 06/16/21 11:18 AM  ?Result Value Ref Range  ? Hemoglobin A1C    ? HbA1c POC (<> result, manual entry)    ? HbA1c, POC (prediabetic range)    ? HbA1c,  POC (controlled diabetic range) 5.9 0.0 - 7.0 %  ? ?ASSESSMENT/PLAN:  ? ?[name] is a 61 y.o. female with a past medical history of osteopenia, prediabetes, and hyperlipidemia as well as findings of bilateral peripheral conjunctival injections on physical exam presenting to the clinic for a routine health exam and a few months of eye irritation. ? ?Prediabetes ?The patient's A1c is holding stead at 5.9 compared to 5.7 a year  ago. Reassured patient that she is doing a good job of taking care of her health and her diabetes risk is not high. Encouraged continued walking for exercise and maintenance of patient's current balanced diet. Follow-up in 1 year. ? ?Hyperlipidemia ?Given the patient's good adherence to atorvastatin without adverse effects and her previous lipids in September 2022, her hyperlipidemia is well-controlled. Deferred lipid labs to September 2023 after discussing previous normal labs with patient to reassure her and noting preventative health guidelines encourage only yearly lab work. ? ?Need for shingles vaccine ?Discussed risks and benefits of Shingrix vaccine with patient and encouraged her to get the vaccine at her convenience. Cautioned patient about potential vaccine side effects, but encouraged vaccine administration for preventative health reasons. The patient is planning to get the vaccine in the near future. ? ?Seasonal allergic conjunctivitis ?Given the patient's peripheral conjunctival injections and timeline of allergy symptoms aligning with these findings, it is likely that she is experiencing allergic conjunctivitis. Lack of iris involvement and lack of vision changes are reassuring findings. Recommended Pataday eye drops daily and switching to regular Claritin instead of occasional Zyrtec. Provided return precautions in case vision changes, significant pain, or noticeable drainage occur. ? ?Osteopenia ?Continue current nutritional supplementation and monitor bone density. ? ? ?Salmaan Patchin Mining engineer, Medical Student ?[place]   ?

## 2021-06-16 NOTE — Assessment & Plan Note (Signed)
Given the patient's good adherence to atorvastatin without adverse effects and her previous lipids in September 2022, her hyperlipidemia is well-controlled. Deferred lipid labs to September 2023 after discussing previous normal labs with patient to reassure her and noting preventative health guidelines encourage only yearly lab work. ?

## 2021-06-16 NOTE — Patient Instructions (Addendum)
Good to see you today - Thank you for coming in! ? ?Things we discussed today: ? ?I sent a prescription to your pharmacy for your Zoster vaccines to help prevent Shingles.  The shot may cause a sore arm and mild flu like symptoms for a few days.  You will need a second shot 2 months after your first.   ? ?I am sending a Pataday eye drop prescription to your pharmacy for your eyes to help with some of the allergy symptoms and redness you have been experiencing. You can also try switching to Claritin over the counter to see if that brings more relief. ? ?Your hemoglobin A1c was 5.9 today, which looks great. Please keep up the exercise and balanced diet. You are doing a great job of managing your health. We will recheck your lipids at your next visit in about 4 months. Please keep taking your vitamin D and calcium supplements to keep your bones strong. ? ?Come back to see me in one year or as needed if anything new comes up. You can send a MyChart message when you want to get your lipid labs around September and I can just order a simple lab visit for you. ?

## 2021-06-16 NOTE — Assessment & Plan Note (Signed)
The patient's A1c is holding stead at 5.9 compared to 5.7 a year ago. Reassured patient that she is doing a good job of taking care of her health and her diabetes risk is not high. Encouraged continued walking for exercise and maintenance of patient's current balanced diet. Follow-up in 1 year. ?

## 2021-06-16 NOTE — Progress Notes (Signed)
? ?  I was present during key history taking and physical exam and any procedures.  I agree with the note above ?Samella Parr MD ? ?

## 2021-06-16 NOTE — Assessment & Plan Note (Signed)
Discussed risks and benefits of Shingrix vaccine with patient and encouraged her to get the vaccine at her convenience. Cautioned patient about potential vaccine side effects, but encouraged vaccine administration for preventative health reasons. The patient is planning to get the vaccine in the near future. ?

## 2021-06-17 MED ORDER — PAZEO 0.7 % OP SOLN: OPHTHALMIC | 3 refills | Status: DC

## 2021-07-17 DIAGNOSIS — M79671 Pain in right foot: Secondary | ICD-10-CM | POA: Diagnosis not present

## 2021-07-17 DIAGNOSIS — M21619 Bunion of unspecified foot: Secondary | ICD-10-CM | POA: Diagnosis not present

## 2021-07-21 ENCOUNTER — Encounter: Payer: Self-pay | Admitting: *Deleted

## 2021-07-23 DIAGNOSIS — M180 Bilateral primary osteoarthritis of first carpometacarpal joints: Secondary | ICD-10-CM | POA: Diagnosis not present

## 2021-07-29 ENCOUNTER — Other Ambulatory Visit: Payer: Self-pay | Admitting: Family Medicine

## 2021-08-05 DIAGNOSIS — G894 Chronic pain syndrome: Secondary | ICD-10-CM | POA: Diagnosis not present

## 2021-08-05 DIAGNOSIS — N3010 Interstitial cystitis (chronic) without hematuria: Secondary | ICD-10-CM | POA: Diagnosis not present

## 2021-08-08 ENCOUNTER — Other Ambulatory Visit: Payer: Self-pay | Admitting: Gastroenterology

## 2021-09-30 DIAGNOSIS — Z79899 Other long term (current) drug therapy: Secondary | ICD-10-CM | POA: Diagnosis not present

## 2021-09-30 DIAGNOSIS — G894 Chronic pain syndrome: Secondary | ICD-10-CM | POA: Diagnosis not present

## 2021-09-30 DIAGNOSIS — Z5181 Encounter for therapeutic drug level monitoring: Secondary | ICD-10-CM | POA: Diagnosis not present

## 2021-10-01 IMAGING — RF DG ESOPHAGUS
9 series · 21 of 24 positions shown · non-contrast
Comparison: Chest radiograph of 10/27/2018

CLINICAL DATA: Globus sensation with history of gastroesophageal
reflux disease.

EXAM:
ESOPHOGRAM / BARIUM SWALLOW / BARIUM TABLET STUDY
TECHNIQUE: Combined double contrast and single contrast examination performed
using effervescent crystals, thick barium liquid, and thin barium
liquid. The patient was observed with fluoroscopy swallowing a 13 mm
barium sulphate tablet.
FLUOROSCOPY TIME:  Fluoroscopy Time:  3 minutes and 18 seconds
Radiation Exposure Index (if provided by the fluoroscopic device):
14.4 mGy
Number of Acquired Spot Images: 0

[Series 1: cp_standard · 0.34mm/px · 3 of 30 frames shown (1 of 9)]
[frame 1/30]
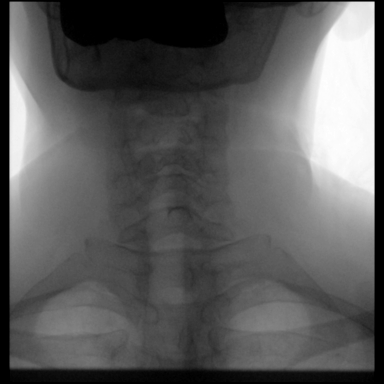
[frame 5/30]
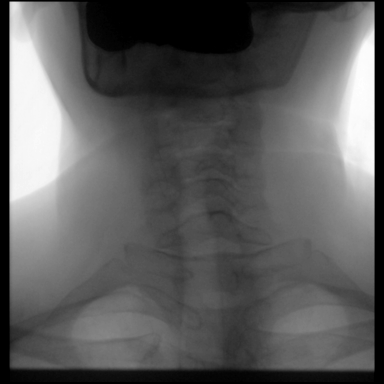
[frame 26/30]
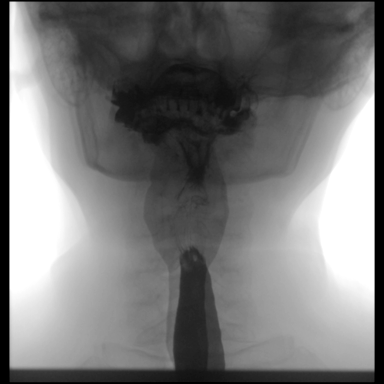

[Series 2: cp_standard · 0.34mm/px · 2 of 11 frames shown (2 of 9)]
[frame 8/11]
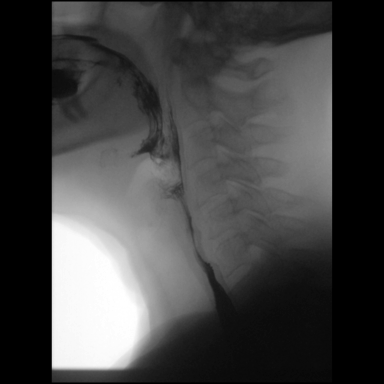
[frame 10/11]
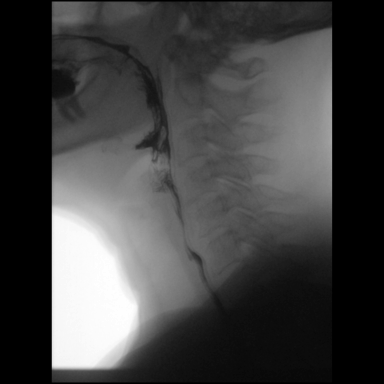

[Series 3: cp_standard · 0.51mm/px · 3 of 109 frames shown (3 of 9)]
[frame 5/109]
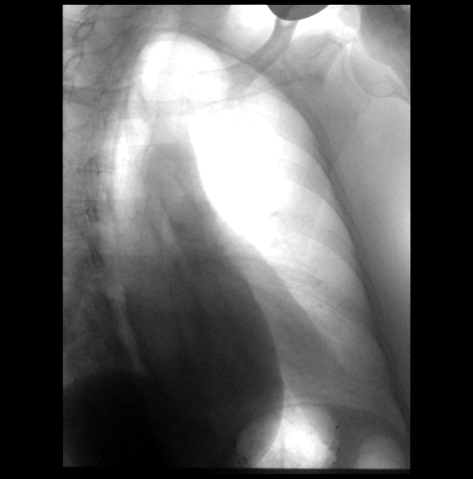
[frame 55/109]
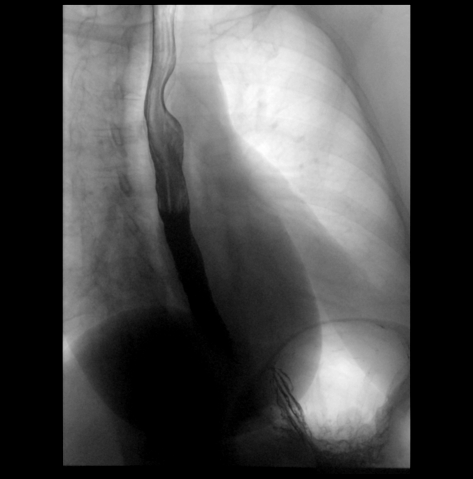
[frame 93/109]
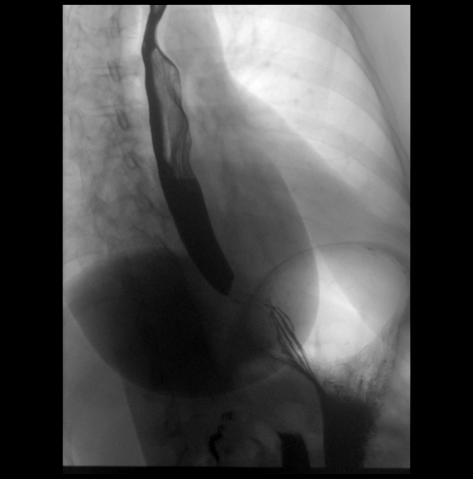

[Series 4: cp_standard · 0.51mm/px · 2 of 13 frames shown (4 of 9)]
[frame 7/13]
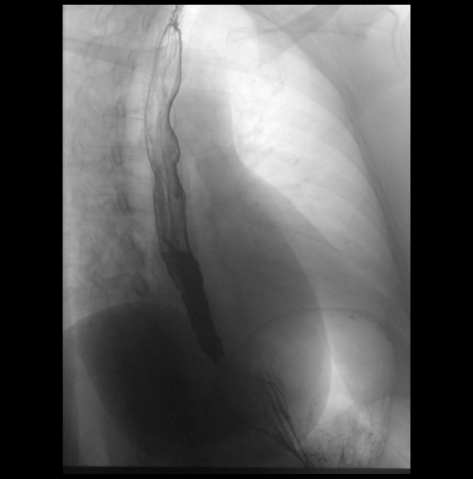
[frame 12/13]
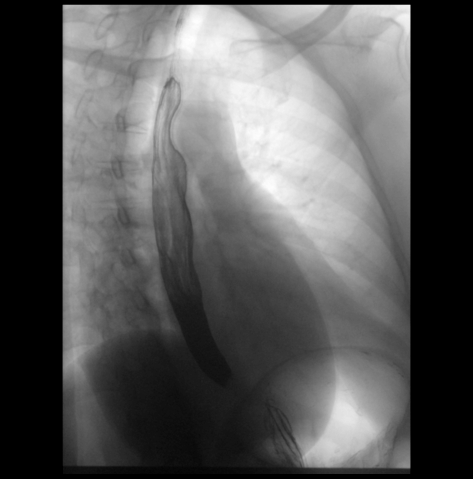

[Series 5: cp_standard · 0.52mm/px · 3 of 35 frames shown (5 of 9)]
[frame 18/35]
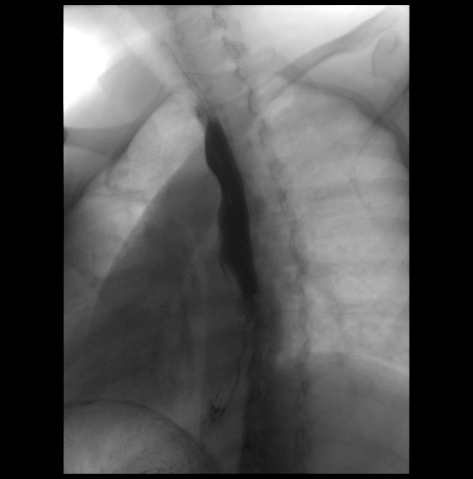
[frame 30/35]
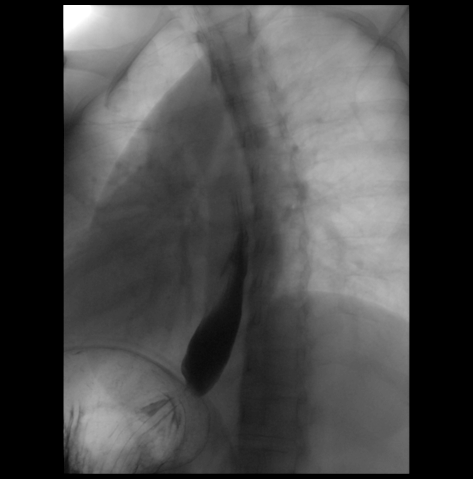
[frame 35/35]
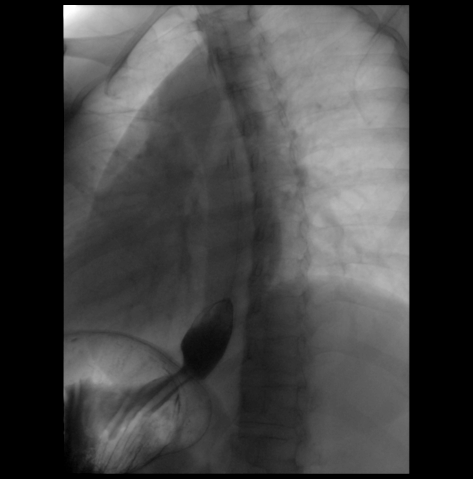

[Series 6: cp_standard · 0.53mm/px · 2 of 40 frames shown (6 of 9)]
[frame 21/40]
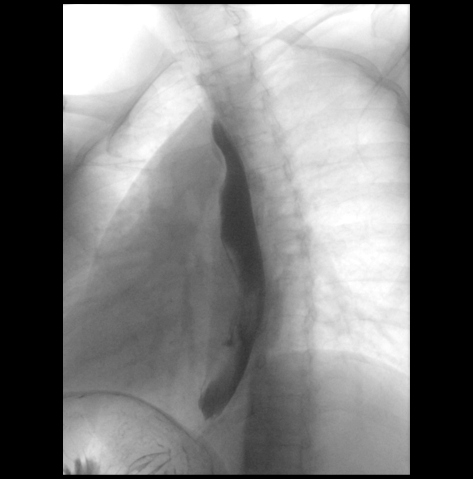
[frame 35/40]
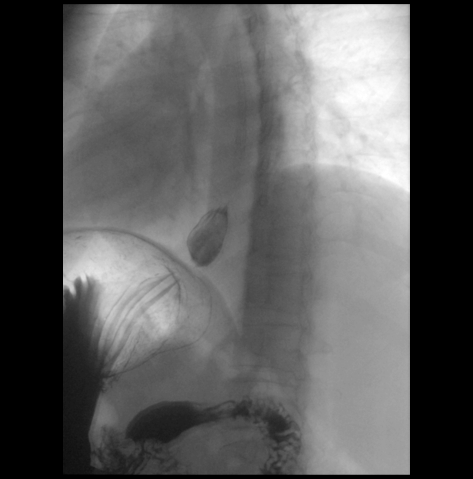

[Series 7: cp_standard · 0.53mm/px · 2 of 78 frames shown (7 of 9)]
[frame 12/78]
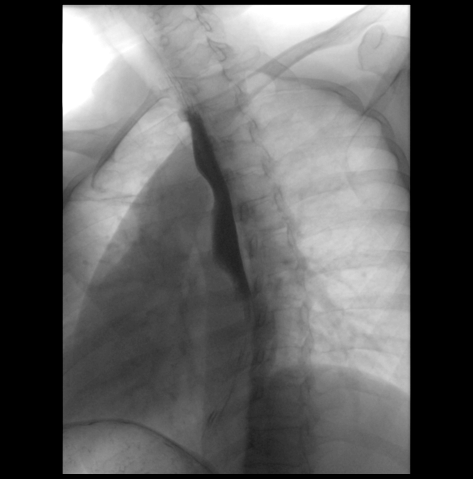
[frame 40/78]
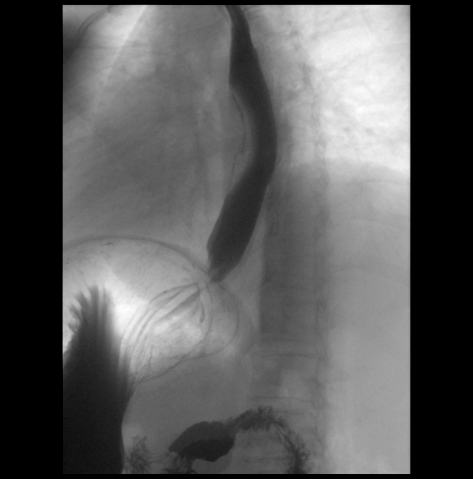

[Series 8: cp_standard · 0.53mm/px · 3 of 72 frames shown (8 of 9)]
[frame 8/72]
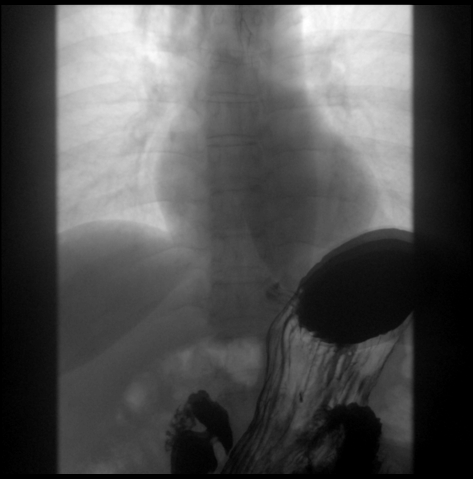
[frame 11/72]
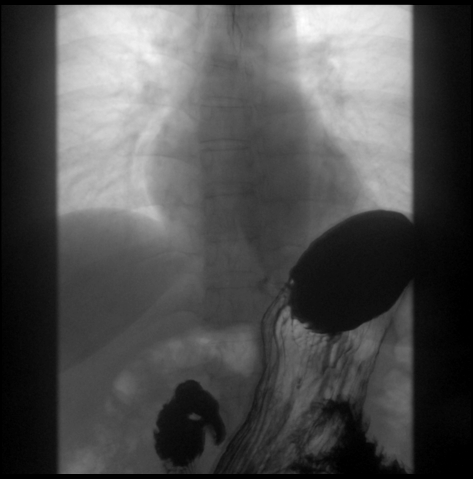
[frame 62/72]
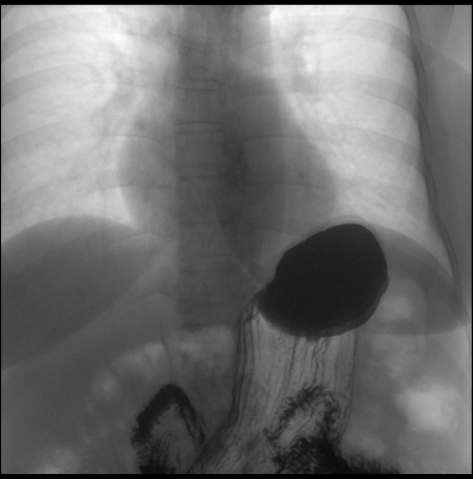

[Series 9: cp_standard · 0.25mm/px · 1 of 1 slices shown (9 of 9)]
[im 1/1]
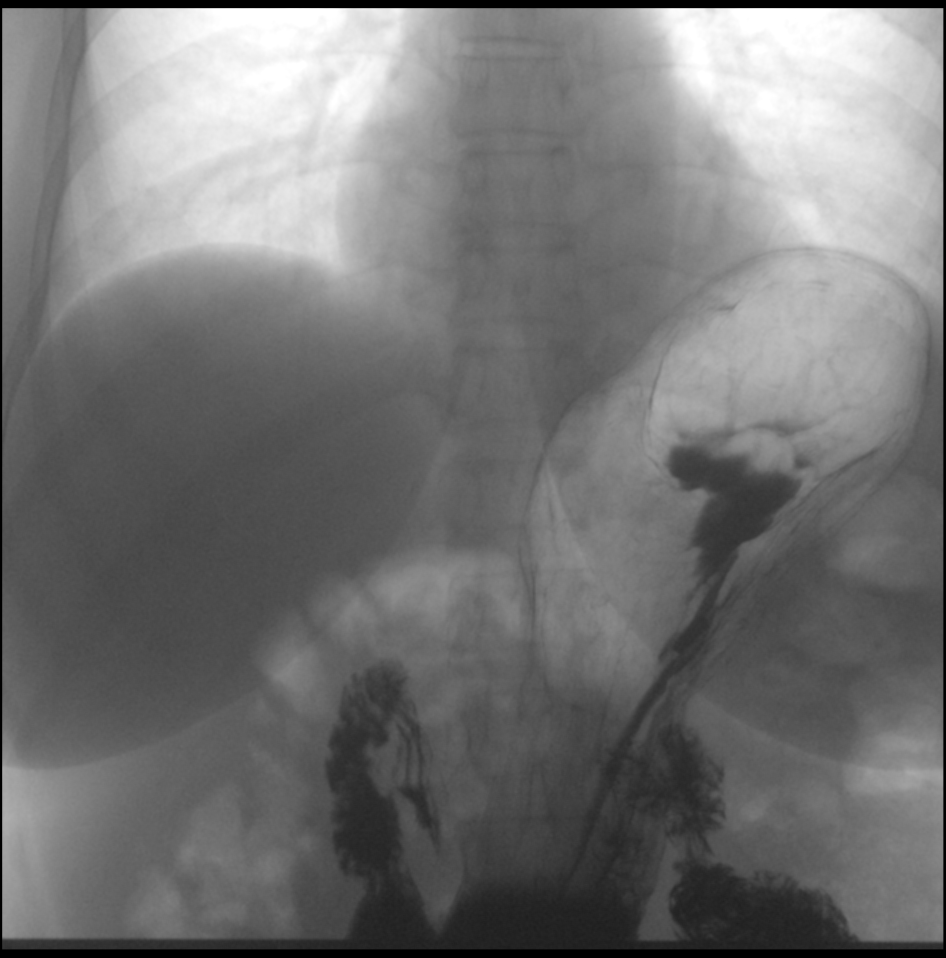

[21 of 24 positions shown; findings below may reference images not displayed]

FINDINGS: Hypopharyngeal portion of the exam is unremarkable.

Double contrast evaluation of the esophagus demonstrates no mucosal
abnormality.

Evaluation of primary peristalsis demonstrates a normal primary
peristaltic wave on each swallow.

Full column evaluation of the esophagus demonstrates no persistent
narrowing or stricture.

With water swallows, gastroesophageal reflux to approximately the
level of the carina.

Minimally delayed passage of a barium tablet at the distal
esophagus. This passed with subsequent water swallows and is the
therefore of questionable clinical significance.
IMPRESSION: Mild gastroesophageal reflux with water swallows. Otherwise, normal
esophagram.

## 2021-10-20 ENCOUNTER — Other Ambulatory Visit: Payer: Self-pay | Admitting: Family Medicine

## 2021-10-20 DIAGNOSIS — Z1231 Encounter for screening mammogram for malignant neoplasm of breast: Secondary | ICD-10-CM

## 2021-11-10 ENCOUNTER — Encounter: Payer: Self-pay | Admitting: Family Medicine

## 2021-11-18 DIAGNOSIS — L820 Inflamed seborrheic keratosis: Secondary | ICD-10-CM | POA: Diagnosis not present

## 2021-11-18 DIAGNOSIS — B079 Viral wart, unspecified: Secondary | ICD-10-CM | POA: Diagnosis not present

## 2021-11-20 MED ORDER — HYDROXYZINE HCL 25 MG PO TABS: 25.0000 mg | ORAL_TABLET | Freq: Every evening | ORAL | 1 refills | Status: DC | PRN

## 2021-11-23 ENCOUNTER — Ambulatory Visit: Payer: Medicaid Other

## 2021-11-26 DIAGNOSIS — M25531 Pain in right wrist: Secondary | ICD-10-CM | POA: Diagnosis not present

## 2021-11-26 DIAGNOSIS — M25532 Pain in left wrist: Secondary | ICD-10-CM | POA: Diagnosis not present

## 2021-11-26 DIAGNOSIS — M1811 Unilateral primary osteoarthritis of first carpometacarpal joint, right hand: Secondary | ICD-10-CM | POA: Diagnosis not present

## 2021-11-26 DIAGNOSIS — R52 Pain, unspecified: Secondary | ICD-10-CM | POA: Diagnosis not present

## 2021-11-26 DIAGNOSIS — M1812 Unilateral primary osteoarthritis of first carpometacarpal joint, left hand: Secondary | ICD-10-CM | POA: Diagnosis not present

## 2021-11-30 DIAGNOSIS — N3010 Interstitial cystitis (chronic) without hematuria: Secondary | ICD-10-CM | POA: Diagnosis not present

## 2021-11-30 DIAGNOSIS — R102 Pelvic and perineal pain: Secondary | ICD-10-CM | POA: Diagnosis not present

## 2021-11-30 DIAGNOSIS — G894 Chronic pain syndrome: Secondary | ICD-10-CM | POA: Diagnosis not present

## 2021-11-30 NOTE — Progress Notes (Unsigned)
    SUBJECTIVE:   CHIEF COMPLAINT / HPI:   Prediabetes The patient's A1c is holding stead at 5.9 compared to 5.7 a year ago. Reassured patient that she is doing a good job of taking care of her health and her diabetes risk is not high. Encouraged continued walking for exercise and maintenance of patient's current balanced diet. Follow-up in 1 year.   Hyperlipidemia Given the patient's good adherence to atorvastatin without adverse effects and her previous lipids in September 2022, her hyperlipidemia is well-controlled. Deferred lipid labs to September 2023 after discussing previous normal labs with patient to reassure her and noting preventative health guidelines encourage only yearly lab work.   Need for shingles vaccine Discussed risks and benefits of Shingrix vaccine with patient and encouraged her to get the vaccine at her convenience. Cautioned patient about potential vaccine side effects, but encouraged vaccine administration for preventative health reasons. The patient is planning to get the vaccine in the near future.   Seasonal allergic conjunctivitis Given the patient's peripheral conjunctival injections and timeline of allergy symptoms aligning with these findings, it is likely that she is experiencing allergic conjunctivitis. Lack of iris involvement and lack of vision changes are reassuring findings. Recommended Pataday eye drops daily and switching to regular Claritin instead of occasional Zyrtec. Provided return precautions in case vision changes, significant pain, or noticeable drainage occur.   Osteopenia Continue current nutritional supplementation and monitor bone density.  PERTINENT  PMH / PSH: ***  OBJECTIVE:   LMP 09/06/2013   ***  ASSESSMENT/PLAN:   No problem-specific Assessment & Plan notes found for this encounter.     Lind Covert, MD [place]

## 2021-12-01 ENCOUNTER — Ambulatory Visit (INDEPENDENT_AMBULATORY_CARE_PROVIDER_SITE_OTHER): Payer: Medicaid Other | Admitting: Family Medicine

## 2021-12-01 ENCOUNTER — Other Ambulatory Visit: Payer: Self-pay

## 2021-12-01 ENCOUNTER — Encounter: Payer: Self-pay | Admitting: Family Medicine

## 2021-12-01 VITALS — BP 140/82 | HR 71 | Wt 153.0 lb

## 2021-12-01 DIAGNOSIS — Z23 Encounter for immunization: Secondary | ICD-10-CM | POA: Diagnosis not present

## 2021-12-01 DIAGNOSIS — E785 Hyperlipidemia, unspecified: Secondary | ICD-10-CM

## 2021-12-01 DIAGNOSIS — R7303 Prediabetes: Secondary | ICD-10-CM

## 2021-12-01 DIAGNOSIS — R739 Hyperglycemia, unspecified: Secondary | ICD-10-CM | POA: Diagnosis not present

## 2021-12-01 DIAGNOSIS — G4700 Insomnia, unspecified: Secondary | ICD-10-CM

## 2021-12-01 LAB — POCT GLYCOSYLATED HEMOGLOBIN (HGB A1C): HbA1c, POC (prediabetic range): 6.1 % (ref 5.7–6.4)

## 2021-12-01 NOTE — Assessment & Plan Note (Signed)
Tolerating statin with Coq10 - check labs

## 2021-12-01 NOTE — Assessment & Plan Note (Signed)
No real change in A!C - stable

## 2021-12-01 NOTE — Patient Instructions (Signed)
Good to see you today - Thank you for coming in  Things we discussed today:  Pre Diabetes Your risk of progression is very low.  Don't worry about it Recheck A1c in 6-12 months if you wish  I will call you if your tests are not good.  Otherwise, I will send you a message on MyChart (if it is active) or a letter in the mail..  If you do not hear from me with in 2 weeks please call our office.      Come back to see me in one year

## 2021-12-01 NOTE — Assessment & Plan Note (Signed)
Taking as needed hydroxyzine.  Discussed not taking every night

## 2021-12-02 LAB — CMP14+EGFR
ALT: 17 IU/L (ref 0–32)
AST: 14 IU/L (ref 0–40)
Albumin/Globulin Ratio: 2.4 — ABNORMAL HIGH (ref 1.2–2.2)
Albumin: 5 g/dL — ABNORMAL HIGH (ref 3.9–4.9)
Alkaline Phosphatase: 85 IU/L (ref 44–121)
BUN/Creatinine Ratio: 30 — ABNORMAL HIGH (ref 12–28)
BUN: 21 mg/dL (ref 8–27)
Bilirubin Total: 0.4 mg/dL (ref 0.0–1.2)
CO2: 24 mmol/L (ref 20–29)
Calcium: 9.9 mg/dL (ref 8.7–10.3)
Chloride: 100 mmol/L (ref 96–106)
Creatinine, Ser: 0.71 mg/dL (ref 0.57–1.00)
Globulin, Total: 2.1 g/dL (ref 1.5–4.5)
Glucose: 105 mg/dL — ABNORMAL HIGH (ref 70–99)
Potassium: 4.7 mmol/L (ref 3.5–5.2)
Sodium: 139 mmol/L (ref 134–144)
Total Protein: 7.1 g/dL (ref 6.0–8.5)
eGFR: 97 mL/min/{1.73_m2} (ref >59)

## 2021-12-02 LAB — LIPID PANEL
Chol/HDL Ratio: 2.8 ratio (ref 0.0–4.4)
Cholesterol, Total: 184 mg/dL (ref 100–199)
HDL: 66 mg/dL (ref >39)
LDL Chol Calc (NIH): 109 mg/dL — ABNORMAL HIGH (ref 0–99)
Triglycerides: 44 mg/dL (ref 0–149)
VLDL Cholesterol Cal: 9 mg/dL (ref 5–40)

## 2021-12-02 LAB — TSH: TSH: 1.75 u[IU]/mL (ref 0.450–4.500)

## 2021-12-08 ENCOUNTER — Inpatient Hospital Stay: Admission: RE | Admit: 2021-12-08 | Payer: Medicaid Other | Source: Ambulatory Visit

## 2021-12-17 ENCOUNTER — Ambulatory Visit
Admission: RE | Admit: 2021-12-17 | Discharge: 2021-12-17 | Disposition: A | Discharge status: Home / Self Care | Payer: Medicaid Other | Source: Ambulatory Visit | Attending: Family Medicine | Admitting: Family Medicine

## 2021-12-17 DIAGNOSIS — Z1231 Encounter for screening mammogram for malignant neoplasm of breast: Secondary | ICD-10-CM | POA: Diagnosis not present

## 2022-01-11 ENCOUNTER — Other Ambulatory Visit: Payer: Self-pay

## 2022-01-11 ENCOUNTER — Telehealth: Payer: Self-pay | Admitting: Gastroenterology

## 2022-01-11 MED ORDER — LIDOCAINE VISCOUS HCL 2 % MT SOLN: OROMUCOSAL | 0 refills | Status: DC

## 2022-01-11 MED ORDER — PANTOPRAZOLE SODIUM 40 MG PO TBEC: 40.0000 mg | DELAYED_RELEASE_TABLET | Freq: Two times a day (BID) | ORAL | 0 refills | Status: DC

## 2022-01-11 NOTE — Telephone Encounter (Signed)
Spoke with patient regarding MD recommendations. Prescription sent to pharmacy.

## 2022-01-11 NOTE — Telephone Encounter (Signed)
Inbound call from patient stating that she needs a prescription sent in for GI cocktail. Patient would like it to be sent to [facility] in [place]. Patient also stated that she has been taking Protonix 2 times daily due to feeling like once daily not helping. Patient stated that she feels like the twice daily is not working either. Patient is requesting a call back to discuss. Please advise.

## 2022-01-11 NOTE — Telephone Encounter (Signed)
Pantoprazole 40 mg po bid for 2 months then resume 40 mg po qd GI cocktail tid prn dyspepsia, 2 week supply, no refills

## 2022-01-11 NOTE — Telephone Encounter (Signed)
Patient called in with complaints of constant upper abdominal pain & a burning sensation that flared up last Tuesday prior to thanksgiving. She was taking pantoprazole QD & increased it to BID last week. No longer on famotidine. She's requesting a GI cocktail be sent in, states this has helped previously. She was last seen for EGD with Dr. Fuller Plan on 10/10/20.

## 2022-01-15 ENCOUNTER — Encounter: Payer: Self-pay | Admitting: Family Medicine

## 2022-01-15 ENCOUNTER — Ambulatory Visit (INDEPENDENT_AMBULATORY_CARE_PROVIDER_SITE_OTHER): Payer: Medicaid Other | Admitting: Family Medicine

## 2022-01-15 VITALS — BP 134/72 | HR 77

## 2022-01-15 DIAGNOSIS — R109 Unspecified abdominal pain: Secondary | ICD-10-CM | POA: Diagnosis not present

## 2022-01-15 DIAGNOSIS — K5900 Constipation, unspecified: Secondary | ICD-10-CM | POA: Diagnosis not present

## 2022-01-15 MED ORDER — SENNA 8.6 MG PO TABS: 1.0000 | ORAL_TABLET | Freq: Every day | ORAL | 0 refills | Status: DC

## 2022-01-15 MED ORDER — POLYETHYLENE GLYCOL 3350 17 GM/SCOOP PO POWD: 17.0000 g | Freq: Every day | ORAL | 0 refills | Status: DC

## 2022-01-15 NOTE — Progress Notes (Signed)
    SUBJECTIVE:   CHIEF COMPLAINT / HPI:   Patient presents with left-sided abdominal pain that started at Thanksgiving, she called her GI doctor and they recommended to increase her protonix twice daily and they also prescribed a GI cocktail but there was little improvement. Based on her history of reflux, this does not seem similar to that. Denies burning sensation or epigastric pain. Describes pain as aching sensation continuously. When the pain first started, she was not doing anything different other than cooking and getting ready for Thanksgiving. She had one episode of pain radiating to her back but it seems to be only on the left side without radiating pain. Before the left pain developed, she reports having the same aching pain on the right abdomen. BM are every other day, soft and formed. Sometimes has hard or small stools.  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 Denies fever, chills, dyspnea, chest pain, nausea, vomiting, hematochezia, dysuria or other symptoms. She is very active, walks a lot and exercises often. Eating makes it worse. Denies any relieving factors.   OBJECTIVE:   BP 134/72   Pulse 77   LMP 09/06/2013   SpO2 100%   General: Patient well-appearing, in no acute distress. CV: RRR, no murmurs or gallops auscultated Resp: CTAB, no wheezing, rales or rhonchi noted Abdomen: soft, tenderness noted bilaterally below breast bone but more prominently along left side, nondistended, presence of bowel sounds, no evidence of organomegaly, negative McBurney's point and Rovsing's sign Psych: mood appropriate, pleasant   ASSESSMENT/PLAN:   Abdominal pain -unsure of exact etiology. Many differentials considered including appendicitis and pancreatitis but low suspicion given exam findings and lipase wnl without presence of leukocytosis. Low concern for mesenteric ischemia given characteristics of aching sensation. MSK etiology  also considered. High suspicion for GERD also contributing to pain given patient's history, she is on a PPI and has had both an EGD and colonoscopy. I encouraged her to follow up with GI outpatient for possible EGD to evaluate for gastric or duodenal ulcers. -instructed to maintain food diary -follow up in 3-4 weeks     -PHQ-9 score of 0 reviewed.    Donney Dice, [place]

## 2022-01-15 NOTE — Patient Instructions (Addendum)
It was great seeing you today!  Today we discussed your abdominal pain. I am still not entirely sure of the cause. Please maintain a food diary and note when you get these symptoms so we can work on avoiding foods that trigger the pain. I have also prescribed miralax and senna as we discussed, this can help you have more formed and regular bowel movements. We will also get some labs which I will let you know if there are any abnormal results.   If the pain still persists, then I would recommend also speaking with the GI specialist for another possible endoscopy to investigate a possible ulcer.  Please follow up at your next scheduled appointment in 3-4 weeks, if anything arises between now and then, please don't hesitate to contact our office.   Thank you for allowing [place] to be a part of your medical care!  Thank you, Dr. Larae Grooms  Also a reminder of our clinic's no-show policy. Please make sure to arrive at least 15 minutes prior to your scheduled appointment time. Please try to cancel before 24 hours if you are not able to make it. If you no-show for 2 appointments then you will be receiving a warning letter. If you no-show after 3 visits, then you may be at risk of being dismissed from our clinic. This is to ensure that everyone is able to be seen in a timely manner. Thank you, we appreciate your assistance with this!

## 2022-01-16 LAB — COMPREHENSIVE METABOLIC PANEL
ALT: 15 IU/L (ref 0–32)
AST: 18 IU/L (ref 0–40)
Albumin/Globulin Ratio: 2.3 — ABNORMAL HIGH (ref 1.2–2.2)
Albumin: 4.9 g/dL (ref 3.9–4.9)
Alkaline Phosphatase: 82 IU/L (ref 44–121)
BUN/Creatinine Ratio: 27 (ref 12–28)
BUN: 20 mg/dL (ref 8–27)
Bilirubin Total: 0.5 mg/dL (ref 0.0–1.2)
CO2: 24 mmol/L (ref 20–29)
Calcium: 9.7 mg/dL (ref 8.7–10.3)
Chloride: 103 mmol/L (ref 96–106)
Creatinine, Ser: 0.74 mg/dL (ref 0.57–1.00)
Globulin, Total: 2.1 g/dL (ref 1.5–4.5)
Glucose: 93 mg/dL (ref 70–99)
Potassium: 4.3 mmol/L (ref 3.5–5.2)
Sodium: 140 mmol/L (ref 134–144)
Total Protein: 7 g/dL (ref 6.0–8.5)
eGFR: 92 mL/min/{1.73_m2} (ref >59)

## 2022-01-16 LAB — LIPASE: Lipase: 15 U/L (ref 14–72)

## 2022-01-16 LAB — CBC
Hematocrit: 43 % (ref 34.0–46.6)
Hemoglobin: 14.1 g/dL (ref 11.1–15.9)
MCH: 29 pg (ref 26.6–33.0)
MCHC: 32.8 g/dL (ref 31.5–35.7)
MCV: 89 fL (ref 79–97)
Platelets: 245 10*3/uL (ref 150–450)
RBC: 4.86 x10E6/uL (ref 3.77–5.28)
RDW: 12.5 % (ref 11.7–15.4)
WBC: 5.6 10*3/uL (ref 3.4–10.8)

## 2022-01-16 NOTE — Assessment & Plan Note (Addendum)
-  unsure of exact etiology. Many differentials considered including appendicitis and pancreatitis but low suspicion given exam findings and lipase wnl without presence of leukocytosis. Low concern for mesenteric ischemia given characteristics of aching sensation. MSK etiology also considered. High suspicion for GERD also contributing to pain given patient's history, she is on a PPI and has had both an EGD and colonoscopy. I encouraged her to follow up with GI outpatient for possible EGD to evaluate for gastric or duodenal ulcers. -instructed to maintain food diary -follow up in 3-4 weeks

## 2022-01-20 ENCOUNTER — Telehealth: Payer: Self-pay | Admitting: Gastroenterology

## 2022-01-20 ENCOUNTER — Encounter: Payer: Self-pay | Admitting: Gastroenterology

## 2022-01-20 ENCOUNTER — Ambulatory Visit (INDEPENDENT_AMBULATORY_CARE_PROVIDER_SITE_OTHER): Payer: Medicaid Other | Admitting: Gastroenterology

## 2022-01-20 VITALS — BP 108/62 | HR 82 | Ht 65.0 in | Wt 153.1 lb

## 2022-01-20 DIAGNOSIS — R1013 Epigastric pain: Secondary | ICD-10-CM

## 2022-01-20 DIAGNOSIS — Z86010 Personal history of colonic polyps: Secondary | ICD-10-CM

## 2022-01-20 DIAGNOSIS — K219 Gastro-esophageal reflux disease without esophagitis: Secondary | ICD-10-CM

## 2022-01-20 MED ORDER — DICYCLOMINE HCL 10 MG PO CAPS: 10.0000 mg | ORAL_CAPSULE | Freq: Three times a day (TID) | ORAL | 11 refills | Status: DC

## 2022-01-20 NOTE — Telephone Encounter (Signed)
Informed patient that it has already been sent to her pharmacy and it should be able to be picked up. Patient verbalized understanding.

## 2022-01-20 NOTE — Telephone Encounter (Signed)
Patient is calling asking about Rx being called in from her 3pm appt today, states she is wanting to pick it up on the way home. Please advise

## 2022-01-20 NOTE — Patient Instructions (Signed)
We have sent the following medications to your pharmacy for you to pick up at your convenience: dicyclomine.   You have been scheduled for a CT scan of the abdomen and pelvis at [facility], 1st floor Radiology. You are scheduled on 01/28/22 at 5:30pm. You should arrive at 3:15 pm. Please follow the written instructions below on the day of your exam:   1) Do not eat anything after 1:30pm (4 hours prior to your test)   ications as prescribed with a small amount of water, if necessary. If you take any of the following medications: METFORMIN, GLUCOPHAGE, GLUCOVANCE, AVANDAMET, RIOMET, FORTAMET, [place] MET, JANUMET, GLUMETZA or METAGLIP, you MAY be asked to HOLD this medication 48 hours AFTER the exam.   The purpose of you drinking the oral contrast is to aid in the visualization of your intestinal tract. The contrast solution may cause some diarrhea. Depending on your individual set of symptoms, you may also receive an intravenous injection of x-ray contrast/dye. Plan on being at [facility] for 45 minutes or longer, depending on the type of exam you are having performed.   If you have any questions regarding your exam or if you need to reschedule, you may call [facility] at [phone] between the hours of 8:00 am and 5:00 pm, Monday-Friday.   The [facility] providers would like to encourage you to use [facility] to communicate with providers for non-urgent requests or questions.  Due to long hold times on the telephone, sending your provider a message by [facility] may be a faster and more efficient way to get a response.  Please allow 48 business hours for a response.  Please remember that this is for non-urgent requests.   Due to recent changes in healthcare laws, you may see the results of your imaging and laboratory studies on MyChart before your provider has had a chance to review them.  We understand that in some cases there may be results that are confusing or concerning to you.  Not all laboratory results come back in the same time frame and the provider may be waiting for multiple results in order to interpret others.  Please give [place] 48 hours in order for your provider to thoroughly review all the results before contacting the office for clarification of your results.   Thank you for choosing me and [facility].  Pricilla Riffle. Dagoberto Ligas., MD., [place]

## 2022-01-20 NOTE — Progress Notes (Signed)
Assessment     Epigastric, RUQ pain and back pain GERD Constipation, likely opioid related Personal history of sessile serrated and adenomatous colon polyps   Recommendations    CT AP without IV contrast. If CT is negative consider RUQ [place] Begin dicyclomine 10 mg p.o. before meals and at bedtime Continue pantoprazole 40 mg p.o. twice daily and follow antireflux measures Continue Senokot, Miralax, Colace Surveillance colonoscopy recommended in April 2025   HPI    This is a 61 year old female with epigastric/RUQ pain and mid back pain.  She relates symptoms present for the past few weeks.  She feels symptoms are worsened after meals.  They have been associated with bloating.  Her reflux symptoms have been under good control.  She had no improvement in symptoms with increasing pantoprazole to twice daily and use of GI cocktail. CBC, CMP, lipase last week were all normal. Abd [place], CT AP and HIDA with EF in Sept 2020 were all normal. Denies weight loss, diarrhea, change in stool caliber, melena, hematochezia, nausea, vomiting, dysphagia, chest pain.   EGD Aug 2022 - Z-line variable, at the gastroesophageal junction. Biopsied. - Multiple gastric polyps. - Erythematous mucosa in the stomach. Biopsied. - Duodenal erosion without bleeding. 1. Surgical [P], gastric antrum and gastric body - BENIGN GASTRIC MUCOSA WITH MILD REACTIVE CHANGES - NO H. PYLORI, INTESTINAL METAPLASIA OR MALIGNANCY IDENTIFIED 2. Surgical [P], distal esophagus - irregular z-line - SQUAMOCOLUMNAR JUNCTION WITH CHRONIC INFLAMMATION - NO INTESTINAL METAPLASIA, DYSPLASIA OR MALIGNANCY IDENTIFIED  Colonoscopy April 2022 - One 25 mm polyp in the transverse colon, removed with a cold snare. Resected and retrieved. - One 10 mm polyp in the descending colon, removed with a cold snare. Resected and retrieved. - The examined portion of the ileum with white mucosa otherwise was normal. - White granular mucosa at the  ileocecal valve. Biopsied. - The examination was otherwise normal on direct and retroflexion views.   Labs / Imaging       Latest Ref Rng & Units 01/15/2022    4:13 PM 12/01/2021    4:20 PM 10/28/2018    8:57 AM  Hepatic Function  Total Protein 6.0 - 8.5 g/dL 7.0  7.1  6.6   Albumin 3.9 - 4.9 g/dL 4.9  5.0  3.9   AST 0 - 40 IU/L _0 ALT 0 - 32 IU/L _1 Alk Phosphatase 44 - 121 IU/L 82  85  55   Total Bilirubin 0.0 - 1.2 mg/dL 0.5  0.4  0.7   Bilirubin, Direct 0.0 - 0.2 mg/dL   <0.1        Latest Ref Rng & Units 01/15/2022    4:13 PM 10/27/2018   10:20 PM 11/16/2017   10:14 AM  CBC  WBC 3.4 - 10.8 x10E3/uL 5.6  11.5  4.2   Hemoglobin 11.1 - 15.9 g/dL 14.1  13.6  13.2   Hematocrit 34.0 - 46.6 % 43.0  40.7  40.4   Platelets 150 - 450 x10E3/uL 245  255  226     Current Medications, Allergies, Past Medical History, Past Surgical History, Family History and Social History were reviewed in [employer] record.   Physical Exam: General: Well developed, well nourished, no acute distress Head: Normocephalic and atraumatic Eyes: Sclerae anicteric, EOMI Ears: Normal auditory acuity Mouth: No deformities or lesions noted Lungs: Clear throughout to auscultation Heart: Regular rate and rhythm; No murmurs,  rubs or bruits Abdomen: Soft, mild RUQ/epigastric tenderness and non distended. No masses, hepatosplenomegaly or hernias noted. Normal Bowel sounds Rectal: Not done Musculoskeletal: Symmetrical with no gross deformities  Pulses:  Normal pulses noted Extremities: No edema or deformities noted Neurological: Alert oriented x 4, grossly nonfocal Psychological:  Alert and cooperative. Anxious    Denise Aguirre Denise Aguirre Plan, MD 01/20/2022, 3:10 PM

## 2022-01-22 ENCOUNTER — Other Ambulatory Visit: Payer: Self-pay | Admitting: Family Medicine

## 2022-01-26 ENCOUNTER — Ambulatory Visit (HOSPITAL_COMMUNITY)
Admission: RE | Admit: 2022-01-26 | Discharge: 2022-01-26 | Disposition: A | Discharge status: Home / Self Care | Payer: Medicaid Other | Source: Ambulatory Visit | Attending: Gastroenterology | Admitting: Gastroenterology

## 2022-01-26 DIAGNOSIS — R1013 Epigastric pain: Secondary | ICD-10-CM | POA: Insufficient documentation

## 2022-01-26 DIAGNOSIS — R1011 Right upper quadrant pain: Secondary | ICD-10-CM | POA: Diagnosis not present

## 2022-01-27 ENCOUNTER — Other Ambulatory Visit: Payer: Self-pay

## 2022-01-27 DIAGNOSIS — R1011 Right upper quadrant pain: Secondary | ICD-10-CM

## 2022-01-28 ENCOUNTER — Ambulatory Visit (HOSPITAL_COMMUNITY): Payer: Medicaid Other

## 2022-01-29 ENCOUNTER — Ambulatory Visit (HOSPITAL_COMMUNITY)
Admission: RE | Admit: 2022-01-29 | Discharge: 2022-01-29 | Disposition: A | Discharge status: Home / Self Care | Payer: Medicaid Other | Source: Ambulatory Visit | Attending: Gastroenterology | Admitting: Gastroenterology

## 2022-01-29 DIAGNOSIS — R1011 Right upper quadrant pain: Secondary | ICD-10-CM | POA: Insufficient documentation

## 2022-01-29 DIAGNOSIS — K760 Fatty (change of) liver, not elsewhere classified: Secondary | ICD-10-CM | POA: Diagnosis not present

## 2022-02-03 ENCOUNTER — Other Ambulatory Visit: Payer: Self-pay | Admitting: Gastroenterology

## 2022-02-03 ENCOUNTER — Telehealth: Payer: Self-pay

## 2022-02-03 ENCOUNTER — Other Ambulatory Visit: Payer: Self-pay

## 2022-02-03 MED ORDER — GLYCOPYRROLATE 2 MG PO TABS: 2.0000 mg | ORAL_TABLET | Freq: Two times a day (BID) | ORAL | 0 refills | Status: DC

## 2022-02-03 NOTE — Telephone Encounter (Signed)
Spoke with patient regarding MD recommendations. Prescription sent to pharmacy. Advised she contact PCP for further recommendations. Pt verbalized all understanding.

## 2022-02-03 NOTE — Telephone Encounter (Signed)
DC dicyclomine. Start Robinul 2 mg po bid. As symptoms are persisting I'm more concerned that a portion of her symptoms are and not GI related so please see your PCP to further evaluate.

## 2022-02-03 NOTE — Telephone Encounter (Signed)
Patient calls nurse line reporting continued RUQ pain.   She reports she had an US done by GI, however reports "they are not doing anything about the findings." She reports continued abdominal discomfort. She reports they prescribed her a medication, however she feels she needs further imaging and/or testing. She reports she spoke with them and they referred her to her PCP for further management.   Patient scheduled for tomorrow to discuss. Patient did not want to wait until PCP availability next week.

## 2022-02-03 NOTE — Telephone Encounter (Signed)
Patient called in stating she is still experiencing RUQ pain that radiates to her side & nausea w/o vomiting, worse after meals. She is currently taking pantoprazole 40 mg BID & increased her dicyclomine to 20 mg AC & HS with no relief. Patient is concerned for her symptoms & given that holidays are coming up, she would like further recommendations. Last seen in [place] on 01/20/22 with Dr. Fuller Plan.

## 2022-02-04 ENCOUNTER — Other Ambulatory Visit (HOSPITAL_COMMUNITY): Payer: Medicaid Other

## 2022-02-04 ENCOUNTER — Encounter: Payer: Self-pay | Admitting: Student

## 2022-02-04 ENCOUNTER — Ambulatory Visit (INDEPENDENT_AMBULATORY_CARE_PROVIDER_SITE_OTHER): Payer: Medicaid Other | Admitting: Student

## 2022-02-04 VITALS — BP 124/76 | HR 79 | Wt 153.1 lb

## 2022-02-04 DIAGNOSIS — R1010 Upper abdominal pain, unspecified: Secondary | ICD-10-CM

## 2022-02-04 NOTE — Assessment & Plan Note (Addendum)
I am not sure what is causing her abdominal pain.  She does have a significant history for abdominal pain for several years however she states this abdominal pain is different.  Abdominal exam is reassuring the patient does not have acute abdomen and her vitals are stable.  CT and ultrasound only revealed a possible tiny gallstone, gallbladder sludge, and fatty liver.  I'm unsure if any of these things could be causing her symptoms and would defer to GI.  I do not think her diet is causing abdominal pain that she states she eats healthy foods, avoids fatty fried foods and does not drink alcohol.  I did discuss with patient that methadone can cause constipation leading to abdominal pain which she was already aware of.  She does not think this is causing her abdominal pain as she has been on this medication for several years for cystitis and has never had this type of pain before.  I advised patient that I cannot order her a HIDA scan, that would be up to GI if they think it would be helpful.  Ultimately, I recommend that patient go back to GI to further discuss her pain and do any additional testing that they feel would be helpful at this time.

## 2022-02-04 NOTE — Patient Instructions (Signed)
It was great to see you! Thank you for allowing me to participate in your care!  Our plans for today:  -I recommend continuing to follow with your GI doctor for your abdominal issues -In the meantime, I recommend staying away from alcohol,  foods that tend to cause stomach upset such as spicy, and fried and fatty foods.  Take care and seek immediate care sooner if you develop any concerns.   Dr. Precious Gilding, DO [facility]

## 2022-02-04 NOTE — Progress Notes (Signed)
    SUBJECTIVE:   CHIEF COMPLAINT / HPI:   Abdominal pain Since Thanksgiving pt has been having a RUQ gnawing pain worse after eating. Occasional nausea on empty stomach upon waking. Has had loose stools on ocassion, which is abnormal for her.  Sometimes has LUQ pain as well.  No vomiting. No fever. Over the phone, GI advised her to increase Protonix to BID which has not helped. Was seen for this in office earlier this month and advised to see GI. Saw GI on 12/6 who ordered CT and US showing gallbladder sludge. GI recommended trial of dicyclomine which didn't help. This was d/c'd and GI then recommended Robinol BID yesterday which pt hasn't picked up yet.  GI also noted that this may not be a GI related problem and that patient should see PCP again.  Today states these symptoms have persisted. She is frustrated as nothing is working.  She wants to know if there is any further testing that should be done such as a HIDA scan or testing for H. pylori.  She states she does not drink any alcohol and tries to avoid fatty and fried foods, typically eats lean meats and vegetables.  She does admit to anxiety however states that her anxiety is caused by this abdominal pain. She does not think her abdominal pain is caused by anxiety.  PERTINENT  PMH / PSH: Constipation, pain syndrome  OBJECTIVE:   BP 124/76   Pulse 79   Wt 153 lb 2 oz (69.5 kg) Comment: Weight from visit on 12/1.  LMP 09/06/2013   SpO2 97%   BMI 25.48 kg/m    General: NAD, pleasant, able to participate in exam Cardiac: RRR, no murmurs. Respiratory: CTAB, normal effort, No wheezes, rales or rhonchi Abdomen: Bowel sounds present, tenderness on palpation of right upper and left upper quadrants without guarding, soft, non distended  Extremities: no edema or cyanosis. Skin: warm and dry, no rashes noted Neuro: alert, no obvious focal deficits Psych: Normal affect and mood  ASSESSMENT/PLAN:   Abdominal pain I am not sure what is  causing her abdominal pain.  She does have a significant history for abdominal pain for several years however she states this abdominal pain is different.  Abdominal exam is reassuring the patient does not have acute abdomen and her vitals are stable.  CT and ultrasound only revealed a possible tiny gallstone, gallbladder sludge, and fatty liver.  I'm unsure if any of these things could be causing her symptoms and would defer to GI.  I do not think her diet is causing abdominal pain that she states she eats healthy foods, avoids fatty fried foods and does not drink alcohol.  I did discuss with patient that methadone can cause constipation leading to abdominal pain which she was already aware of.  She does not think this is causing her abdominal pain as she has been on this medication for several years for cystitis and has never had this type of pain before.  I advised patient that I cannot order her a HIDA scan, that would be up to GI if they think it would be helpful.  Ultimately, I recommend that patient go back to GI to further discuss her pain and do any additional testing that they feel would be helpful at this time.     Dr. Precious Gilding, [place]

## 2022-02-16 ENCOUNTER — Telehealth: Payer: Self-pay | Admitting: Gastroenterology

## 2022-02-16 ENCOUNTER — Encounter (HOSPITAL_BASED_OUTPATIENT_CLINIC_OR_DEPARTMENT_OTHER): Payer: Self-pay

## 2022-02-16 ENCOUNTER — Ambulatory Visit (INDEPENDENT_AMBULATORY_CARE_PROVIDER_SITE_OTHER): Payer: Medicaid Other | Admitting: Gastroenterology

## 2022-02-16 ENCOUNTER — Emergency Department (HOSPITAL_BASED_OUTPATIENT_CLINIC_OR_DEPARTMENT_OTHER)
Admission: EM | Admit: 2022-02-16 | Discharge: 2022-02-16 | Disposition: A | Discharge status: Home / Self Care | Payer: Medicaid Other | Attending: Emergency Medicine | Admitting: Emergency Medicine

## 2022-02-16 ENCOUNTER — Other Ambulatory Visit: Payer: Self-pay

## 2022-02-16 ENCOUNTER — Encounter: Payer: Self-pay | Admitting: Gastroenterology

## 2022-02-16 ENCOUNTER — Emergency Department (HOSPITAL_BASED_OUTPATIENT_CLINIC_OR_DEPARTMENT_OTHER): Payer: Medicaid Other | Admitting: Radiology

## 2022-02-16 VITALS — BP 130/78 | HR 80 | Ht 65.0 in | Wt 154.2 lb

## 2022-02-16 DIAGNOSIS — R1011 Right upper quadrant pain: Secondary | ICD-10-CM

## 2022-02-16 DIAGNOSIS — R079 Chest pain, unspecified: Secondary | ICD-10-CM | POA: Diagnosis not present

## 2022-02-16 DIAGNOSIS — R0789 Other chest pain: Secondary | ICD-10-CM

## 2022-02-16 DIAGNOSIS — R002 Palpitations: Secondary | ICD-10-CM | POA: Diagnosis not present

## 2022-02-16 DIAGNOSIS — R1012 Left upper quadrant pain: Secondary | ICD-10-CM

## 2022-02-16 LAB — CBC
HCT: 41.8 % (ref 36.0–46.0)
Hemoglobin: 14.1 g/dL (ref 12.0–15.0)
MCH: 29.4 pg (ref 26.0–34.0)
MCHC: 33.7 g/dL (ref 30.0–36.0)
MCV: 87.3 fL (ref 80.0–100.0)
Platelets: 244 10*3/uL (ref 150–400)
RBC: 4.79 MIL/uL (ref 3.87–5.11)
RDW: 11.9 % (ref 11.5–15.5)
WBC: 7.7 10*3/uL (ref 4.0–10.5)
nRBC: 0 % (ref 0.0–0.2)

## 2022-02-16 LAB — BASIC METABOLIC PANEL
Anion gap: 9 (ref 5–15)
BUN: 15 mg/dL (ref 8–23)
CO2: 25 mmol/L (ref 22–32)
Calcium: 10.1 mg/dL (ref 8.9–10.3)
Chloride: 105 mmol/L (ref 98–111)
Creatinine, Ser: 0.7 mg/dL (ref 0.44–1.00)
GFR, Estimated: >60 mL/min (ref >60)
Glucose, Bld: 136 mg/dL — ABNORMAL HIGH (ref 70–99)
Potassium: 3.4 mmol/L — ABNORMAL LOW (ref 3.5–5.1)
Sodium: 139 mmol/L (ref 135–145)

## 2022-02-16 LAB — TROPONIN I (HIGH SENSITIVITY)
Troponin I (High Sensitivity): <2 ng/L (ref <18)
Troponin I (High Sensitivity): 2 ng/L (ref <18)

## 2022-02-16 MED ORDER — ASPIRIN 81 MG PO CHEW
324.0000 mg | CHEWABLE_TABLET | Freq: Once | ORAL | Status: AC
  Administered 2022-02-16: 324 mg via ORAL
  Filled 2022-02-16: qty 4

## 2022-02-16 NOTE — ED Triage Notes (Signed)
Patient reports having left side chest pain and feeling like her heart was racing. Pain rated 7/10, with pressure, nausea and shortness of breath. No blood thinners. No Hx of DM or HTN. Patient states she has not felt good today. Chest pain woke her up from sleep.

## 2022-02-16 NOTE — Telephone Encounter (Signed)
Called [place] [employer] to ge the auth for her EGD tomorrow-- needs clinical review- instead of faxing notes, they are going to call us back and ask for one of you. Guessing this is something new? The pending case # is [id number]

## 2022-02-16 NOTE — Discharge Instructions (Signed)
All the heart markers and your EKG look good today.

## 2022-02-16 NOTE — ED Provider Notes (Signed)
Second troponin is negative.  Patient to follow-up with her GI doctor and potentially cardiology as an outpatient if this continues   Blanchie Dessert, MD 02/16/22 [phone]

## 2022-02-16 NOTE — ED Provider Notes (Signed)
[facility] EMERGENCY DEPT  Provider Note  CSN: [id number] Arrival date & time: 02/16/22 0502  History Chief Complaint  Patient presents with   Chest Pain    Denise Aguirre is a 62 y.o. female with no history of HTN, DM, HLD, tobacco use, or family history of CAD here with intermittent episodes of chest pain mostly during the night the last 4 nights, but also some pressure when walking during the day. Pain is mid-chest, non radiating, not associated with SOB but she feels her heart racing and then her BP is elevated at the same time. Has woken her from sleep multiple times. She had a CT Coronaries in Jan 2023 with a Calcium Score of 0. Currently still having some discomfort.   Since around Thanksgiving, she has had multiple PCP and GI visits for upper abdominal pain without a clear etiology found. She reports this pain is different and not present before the last few days.    Home Medications Prior to Admission medications   Medication Sig Start Date End Date Taking? Authorizing Provider  atorvastatin (LIPITOR) 40 MG tablet TAKE 1 TABLET(40 MG) BY MOUTH DAILY 07/30/21   Lind Covert, MD  Calcium Carb-Cholecalciferol 600-200 MG-UNIT TABS Take 2 tablets by mouth daily.    [provider]  co-enzyme Q-10 50 MG capsule Take 50 mg by mouth daily.    [provider]  Docusate Calcium (STOOL SOFTENER PO) Take 2 capsules by mouth at bedtime.    [provider]  glycopyrrolate (ROBINUL) 2 MG tablet TAKE 1 TABLET(2 MG) BY MOUTH TWICE DAILY Patient not taking: Reported on 02/04/2022 02/03/22   Ladene Artist, MD  hydrOXYzine (ATARAX) 25 MG tablet TAKE 1 TABLET(25 MG) BY MOUTH AT BEDTIME AS NEEDED 01/22/22   Lind Covert, MD  methadone (DOLOPHINE) 5 MG tablet Take 5 mg by mouth 2 (two) times daily.    [provider]  naloxone [facility]) nasal spray 4 mg/0.1 mL  05/23/20   [provider]  Omega-3 Fatty Acids (FISH  OIL PO) Take 2 capsules by mouth daily.     [provider]  pantoprazole (PROTONIX) 40 MG tablet Take 1 tablet (40 mg total) by mouth 2 (two) times daily. 01/11/22   Ladene Artist, MD  polyethylene glycol powder (GLYCOLAX/MIRALAX) 17 GM/SCOOP powder Take 17 g by mouth daily. 01/15/22   Donney Dice, DO  senna (SENOKOT) 8.6 MG TABS tablet Take 1 tablet (8.6 mg total) by mouth daily. 01/15/22   Donney Dice, DO     Allergies    Ivp dye [iodinated contrast media] and Topiramate   Review of Systems   Review of Systems Please see HPI for pertinent positives and negatives  Physical Exam BP (!) 141/62   Pulse 72   Temp 98.5 F (36.9 C) (Oral)   Resp 17   Ht '5\' 5"'$  (1.651 m)   Wt 68 kg   LMP 09/06/2013   SpO2 97%   BMI 24.96 kg/m   Physical Exam Vitals and nursing note reviewed.  Constitutional:      Appearance: Normal appearance.  HENT:     Head: Normocephalic and atraumatic.     Nose: Nose normal.     Mouth/Throat:     Mouth: Mucous membranes are moist.  Eyes:     Extraocular Movements: Extraocular movements intact.     Conjunctiva/sclera: Conjunctivae normal.  Cardiovascular:     Rate and Rhythm: Normal rate.  Pulmonary:     Effort:  Pulmonary effort is normal.     Breath sounds: Normal breath sounds.  Abdominal:     General: Abdomen is flat.     Palpations: Abdomen is soft.     Tenderness: There is no abdominal tenderness.  Musculoskeletal:        General: No swelling. Normal range of motion.     Cervical back: Neck supple.  Skin:    General: Skin is warm and dry.  Neurological:     General: No focal deficit present.     Mental Status: She is alert.  Psychiatric:        Mood and Affect: Mood normal.     ED Results / Procedures / Treatments   EKG EKG Interpretation  Date/Time:  Tuesday February 16 2022 05:11:59 EST Ventricular Rate:  69 PR Interval:  140 QRS Duration: 100 QT Interval:  400 QTC Calculation: 428 R Axis:   10 Text  Interpretation: Normal sinus rhythm Incomplete right bundle branch block Nonspecific ST abnormality Abnormal ECG When compared with ECG of 27-Oct-2018 22:09, Nonspecific T wave abnormality no longer evident in Anterior leads Otherwise no significant change Confirmed by Calvert Cantor [phone]) on 02/16/2022 5:13:09 AM  Procedures Procedures  Medications Ordered in the ED Medications  aspirin chewable tablet 324 mg (324 mg Oral Given 02/16/22 0617)    Initial Impression and Plan  Patient with low risk factor profile here for atypical chest pain. Exam is reassuring. Vitals here are unremarkable, reportedly was hypertensive and tachycardic at home. Labs done in triage show unremarkable CBC, BMP and Trop x 1. I personally viewed the images from radiology studies and agree with radiologist interpretation: CXR is clear. Will give ASA, continue to monitor and repeat troponin. If remains flat, do not feel she would benefit from admission given her low risk. Care of the patient will be signed out to the oncoming team at the change of shift.   ED Course       MDM Rules/Calculators/A&P Medical Decision Making Problems Addressed: Atypical chest pain: acute illness or injury  Amount and/or Complexity of Data Reviewed Labs: ordered. Radiology: ordered.  Risk OTC drugs.    Final Clinical Impression(s) / ED Diagnoses Final diagnoses:  Atypical chest pain    Rx / DC Orders ED Discharge Orders     None        Truddie Hidden, MD 02/16/22 [phone]

## 2022-02-16 NOTE — Progress Notes (Signed)
Assessment     LUQ > RUQ abdominal pain. R/O ulcer, gastritis, dyspepsia, functional pain, musculoskeletal Atypical chest pain, ED evaluation negative. Does not appear to be reflux related. GERD Constipation, likely opioid related Personal history of sessile serrated and adenomatous colon polyps   Recommendations    Schedule EGD. The risks (including bleeding, perforation, infection, missed lesions, medication reactions and possible hospitalization or surgery if complications occur), benefits, and alternatives to endoscopy with possible biopsy and possible dilation were discussed with the patient and they consent to proceed.   DC Robinul FDgard 1-2 po tid ac Continue pantoprazole 40 mg twice daily and antireflux measures Continue stool softener, Senokot and MiraLAX Scheduled follow-up with PCP   HPI    This is a 62 year old female with LUQ pain > RUQ pain. CT AP showed a possible small gallstone and RUQ [place] was unremarkable. Right back pain has resolved. Mild RUQ pain persists. LUQ pain is now the most prominent symptom. No response to Robinul.  She relates her constipation is under good control.  Her left upper quadrant and right upper quadrant abdominal pain are present at all times and slightly exacerbated by meals.  We reviewed her evaluation to date without significant findings.  She is concerned about an ulcer and she desires further evaluation with EGD.  She was evaluated in the ED overnight and just discharged this morning for intermittent chest pain and pressure for the past 4 evenings.  She relates that her heart was racing with an elevated BP.  Blood work and 2 troponins were unremarkable.  Chest x-ray was clear.  Diagnosis was atypical chest pain.  Patient feels this recent chest pain was not at all typical for her prior episodes of reflux.   Labs / Imaging       Latest Ref Rng & Units 01/15/2022    4:13 PM 12/01/2021    4:20 PM 10/28/2018    8:57 AM  Hepatic Function   Total Protein 6.0 - 8.5 g/dL 7.0  7.1  6.6   Albumin 3.9 - 4.9 g/dL 4.9  5.0  3.9   AST 0 - 40 IU/L _0 ALT 0 - 32 IU/L _1 Alk Phosphatase 44 - 121 IU/L 82  85  55   Total Bilirubin 0.0 - 1.2 mg/dL 0.5  0.4  0.7   Bilirubin, Direct 0.0 - 0.2 mg/dL   <0.1        Latest Ref Rng & Units 02/16/2022    5:20 AM 01/15/2022    4:13 PM 10/27/2018   10:20 PM  CBC  WBC 4.0 - 10.5 K/uL 7.7  5.6  11.5   Hemoglobin 12.0 - 15.0 g/dL 14.1  14.1  13.6   Hematocrit 36.0 - 46.0 % 41.8  43.0  40.7   Platelets 150 - 400 K/uL 244  245  255      DG Chest 2 View  Result Date: 02/16/2022 CLINICAL DATA:  62 year old female with chest pain, palpitations. EXAM: CHEST - 2 VIEW COMPARISON:  Cardiac CT 03/16/2021 and earlier. FINDINGS: PA and lateral views at 0523 hours. Lower lung volumes. Mediastinal contours remain normal. Visualized tracheal air column is within normal limits. Lung markings remain within normal limits. Both lungs appear clear. No pneumothorax or pleural effusion. No osseous abnormality identified. Negative visible bowel gas. IMPRESSION: Lower lung volumes.  No acute cardiopulmonary abnormality. Electronically Signed   By: Herminio Heads.D.  On: 02/16/2022 05:41   US Abdomen Limited RUQ (LIVER/GB)  Result Date: 01/29/2022 CLINICAL DATA:  Possible gallstone seen on CT. EXAM: ULTRASOUND ABDOMEN LIMITED RIGHT UPPER QUADRANT COMPARISON:  January 26, 2022 CT abdomen and pelvis. FINDINGS: Gallbladder: Hypoechoic area noted in the posterior gallbladder lumen questioned tumefactive sludge. No wall thickening visualized, gallbladder wall measures 1 mm. No sonographic Murphy sign noted by sonographer. Common bile duct: Diameter: 3 mm. Liver: No focal lesion identified. Mild diffuse increased echotexture of liver noted. Portal vein is patent on color Doppler imaging with normal direction of blood flow towards the liver. Other: None. IMPRESSION: 1. Hypoechoic area noted in the posterior  gallbladder lumen questioned tumefactive sludge. 2. Mild fatty infiltration of liver. Electronically Signed   By: Wei-Chen  Lin M.D.   On: 01/29/2022 15:52   CT ABDOMEN PELVIS WO CONTRAST  Result Date: 01/27/2022 CLINICAL DATA:  Epigastric and right upper quadrant abdominal pain. EXAM: CT ABDOMEN AND PELVIS WITHOUT CONTRAST TECHNIQUE: Multidetector CT imaging of the abdomen and pelvis was performed following the standard protocol without IV contrast. RADIATION DOSE REDUCTION: This exam was performed according to the departmental dose-optimization program which includes automated exposure control, adjustment of the mA and/or kV according to patient size and/or use of iterative reconstruction technique. COMPARISON:  10/28/2018 FINDINGS: Lower chest: No acute findings. Hepatobiliary: No mass visualized on this unenhanced exam. Probable tiny calcified gallstone noted, however there is no evidence of cholecystitis or biliary ductal dilatation. Pancreas: No mass or inflammatory process visualized on this unenhanced exam. Spleen:  Within normal limits in size. Adrenals/Urinary tract: No evidence of urolithiasis or hydronephrosis. Unremarkable unopacified urinary bladder. Stomach/Bowel: No evidence of obstruction, inflammatory process, or abnormal fluid collections. Vascular/Lymphatic: No pathologically enlarged lymph nodes identified. No evidence of abdominal aortic aneurysm. Reproductive: Prior hysterectomy noted. Adnexal regions are unremarkable in appearance. Other:  None. Musculoskeletal:  No suspicious bone lesions identified. IMPRESSION: No evidence of urolithiasis, hydronephrosis, or other acute findings. Probable tiny calcified gallstone. No radiographic evidence of cholecystitis or biliary ductal dilatation. Electronically Signed   By: John A Stahl M.D.   On: 01/27/2022 13:45    Current Medications, Allergies, Past Medical History, Past Surgical History, Family History and Social History were reviewed in  [facility] electronic medical record.   Physical Exam: General: Well developed, well nourished, no acute distress Head: Normocephalic and atraumatic Eyes: Sclerae anicteric, EOMI Ears: Normal auditory acuity Mouth: No deformities or lesions noted Lungs: Clear throughout to auscultation Heart: Regular rate and rhythm; No murmurs, rubs or bruits Abdomen: Soft, mild LUQ > RUQ tenderness and non distended. No masses, hepatosplenomegaly or hernias noted. Normal Bowel sounds Rectal: Not done Musculoskeletal: Symmetrical with no gross deformities  Pulses:  Normal pulses noted Extremities: No edema or deformities noted Neurological: Alert oriented x 4, grossly nonfocal Psychological:  Alert and cooperative. Depressed affect, anxious   Malcolm T. Stark, MD 02/16/2022, 10:40 AM  

## 2022-02-16 NOTE — Patient Instructions (Signed)
_______________________________________________________  If you are age 61 or older, your body mass index should be between 23-30. Your Body mass index is 25.66 kg/m. If this is out of the aforementioned range listed, please consider follow up with your Primary Care Provider.  If you are age 5 or younger, your body mass index should be between 19-25. Your Body mass index is 25.66 kg/m. If this is out of the aformentioned range listed, please consider follow up with your Primary Care Provider.   ________________________________________________________  The [facility] providers would like to encourage you to use [facility] to communicate with providers for non-urgent requests or questions.  Due to long hold times on the telephone, sending your provider a message by [facility] may be a faster and more efficient way to get a response.  Please allow 48 business hours for a response.  Please remember that this is for non-urgent requests.  _______________________________________________________  Denise Aguirre have been scheduled for an endoscopy. Please follow written instructions given to you at your visit today. If you use inhalers (even only as needed), please bring them with you on the day of your procedure.  You have been given samples of FD Donald Prose - take 1-2 before meals three times a day.  Discontinue Robinul

## 2022-02-17 ENCOUNTER — Encounter: Payer: Self-pay | Admitting: Gastroenterology

## 2022-02-17 ENCOUNTER — Ambulatory Visit (AMBULATORY_SURGERY_CENTER): Payer: Medicaid Other | Admitting: Gastroenterology

## 2022-02-17 VITALS — BP 109/59 | HR 71 | Temp 97.8°F | Resp 12 | Ht 65.0 in | Wt 154.0 lb

## 2022-02-17 DIAGNOSIS — K317 Polyp of stomach and duodenum: Secondary | ICD-10-CM | POA: Diagnosis not present

## 2022-02-17 DIAGNOSIS — R1011 Right upper quadrant pain: Secondary | ICD-10-CM

## 2022-02-17 DIAGNOSIS — R1012 Left upper quadrant pain: Secondary | ICD-10-CM | POA: Diagnosis not present

## 2022-02-17 DIAGNOSIS — K319 Disease of stomach and duodenum, unspecified: Secondary | ICD-10-CM | POA: Diagnosis not present

## 2022-02-17 DIAGNOSIS — K219 Gastro-esophageal reflux disease without esophagitis: Secondary | ICD-10-CM | POA: Diagnosis not present

## 2022-02-17 MED ORDER — SODIUM CHLORIDE 0.9 % IV SOLN: 500.0000 mL | Freq: Once | INTRAVENOUS | Status: DC

## 2022-02-17 NOTE — Telephone Encounter (Signed)
Egd was approved

## 2022-02-17 NOTE — Progress Notes (Signed)
Called to room to assist during endoscopic procedure.  Patient ID and intended procedure confirmed with present staff. Received instructions for my participation in the procedure from the performing physician.  

## 2022-02-17 NOTE — Progress Notes (Signed)
VS by CW  Pt's states no medical or surgical changes since previsit or office visit.  

## 2022-02-17 NOTE — Progress Notes (Signed)
See 02/16/2022 H&P, no changes

## 2022-02-17 NOTE — Op Note (Signed)
[facility] Patient Name: Denise Aguirre Procedure Date: 02/17/2022 2:59 PM MRN: [id number] Endoscopist: Ladene Artist , MD, [id number] Age: 62 Referring MD:  Date of Birth: 01/01/1961 Gender: Female Account #: [id number] Procedure:                Upper GI endoscopy Indications:              Abdominal pain in the right upper quadrant,                            Abdominal pain in the left upper quadrant Medicines:                Monitored Anesthesia Care Procedure:                Pre-Anesthesia Assessment:                           - Prior to the procedure, a History and Physical                            was performed, and patient medications and                            allergies were reviewed. The patient's tolerance of                            previous anesthesia was also reviewed. The risks                            and benefits of the procedure and the sedation                            options and risks were discussed with the patient.                            All questions were answered, and informed consent                            was obtained. Prior Anticoagulants: The patient has                            taken no anticoagulant or antiplatelet agents. ASA                            Grade Assessment: II - A patient with mild systemic                            disease. After reviewing the risks and benefits,                            the patient was deemed in satisfactory condition to                            undergo the procedure.  After obtaining informed consent, the endoscope was                            passed under direct vision. Throughout the                            procedure, the patient's blood pressure, pulse, and                            oxygen saturations were monitored continuously. The                            GIF [id number] #[id number] was introduced through the                            mouth, and  advanced to the second part of duodenum.                            The upper GI endoscopy was accomplished without                            difficulty. The patient tolerated the procedure                            well. Scope In: Scope Out: Findings:                 The Z-line was variable (1 cm) and was found at the                            gastroesophageal junction. Biopsies were taken with                            a cold forceps for histology.                           The exam of the esophagus was otherwise normal.                           Striped mildly erythematous mucosa without bleeding                            was found in the gastric antrum. Biopsies were                            taken with a cold forceps for histology.                           A few small sessile polyps with no bleeding and no                            stigmata of recent bleeding were found in the  gastric fundus and in the gastric body. Typical                            appearance for benign fundic gland polyps,                            previously biopsied.                           The exam of the stomach was otherwise normal.                           The duodenal bulb and second portion of the                            duodenum were normal. Biopsies for histology were                            taken with a cold forceps for evaluation of celiac                            disease. Complications:            No immediate complications. Estimated Blood Loss:     Estimated blood loss was minimal. Impression:               - Z-line variable, at the gastroesophageal                            junction. Biopsied.                           - Erythematous mucosa in the antrum. Biopsied.                           - A few gastric polyps c/w benign fundic gland                            polyps.                           - Normal duodenal bulb and second portion of the                             duodenum. Biopsied. Recommendation:           - Patient has a contact number available for                            emergencies. The signs and symptoms of potential                            delayed complications were discussed with the                            patient. Return to normal activities tomorrow.  Written discharge instructions were provided to the                            patient.                           - Resume previous diet.                           - Continue present medications.                           - FDgard 1-2 po tid ac.                           - Await pathology results. Ladene Artist, MD 02/17/2022 3:21:37 PM This report has been signed electronically.

## 2022-02-17 NOTE — Progress Notes (Signed)
Report to PACU, RN, vss, BBS= Clear.  

## 2022-02-17 NOTE — Patient Instructions (Signed)
**  Sample of [place] given **   YOU HAD AN ENDOSCOPIC PROCEDURE TODAY AT [facility]:   Refer to the procedure report that was given to you for any specific questions about what was found during the examination.  If the procedure report does not answer your questions, please call your gastroenterologist to clarify.  If you requested that your care partner not be given the details of your procedure findings, then the procedure report has been included in a sealed envelope for you to review at your convenience later.  YOU SHOULD EXPECT: Some feelings of bloating in the abdomen. Passage of more gas than usual.  Walking can help get rid of the air that was put into your GI tract during the procedure and reduce the bloating. If you had a lower endoscopy (such as a colonoscopy or flexible sigmoidoscopy) you may notice spotting of blood in your stool or on the toilet paper. If you underwent a bowel prep for your procedure, you may not have a normal bowel movement for a few days.  Please Note:  You might notice some irritation and congestion in your nose or some drainage.  This is from the oxygen used during your procedure.  There is no need for concern and it should clear up in a day or so.  SYMPTOMS TO REPORT IMMEDIATELY:   Following upper endoscopy (EGD)  Vomiting of blood or coffee ground material  New chest pain or pain under the shoulder blades  Painful or persistently difficult swallowing  New shortness of breath  Fever of 100F or higher  Black, tarry-looking stools  For urgent or emergent issues, a gastroenterologist can be reached at any hour by calling [phone]. Do not use MyChart messaging for urgent concerns.    DIET:  We do recommend a small meal at first, but then you may proceed to your regular diet.  Drink plenty of fluids but you should avoid alcoholic beverages for 24 hours.  ACTIVITY:  You should plan to take it easy for the rest of today and you should NOT  DRIVE or use heavy machinery until tomorrow (because of the sedation medicines used during the test).    FOLLOW UP: Our staff will call the number listed on your records the next business day following your procedure.  We will call around 7:15- 8:00 am to check on you and address any questions or concerns that you may have regarding the information given to you following your procedure. If we do not reach you, we will leave a message.     If any biopsies were taken you will be contacted by phone or by letter within the next 1-3 weeks.  Please call us at [phone] if you have not heard about the biopsies in 3 weeks.    SIGNATURES/CONFIDENTIALITY: You and/or your care partner have signed paperwork which will be entered into your electronic medical record.  These signatures attest to the fact that that the information above on your After Visit Summary has been reviewed and is understood.  Full responsibility of the confidentiality of this discharge information lies with you and/or your care-partner.

## 2022-02-18 ENCOUNTER — Telehealth: Payer: Self-pay | Admitting: *Deleted

## 2022-02-18 NOTE — Telephone Encounter (Signed)
Attempted f/u phone call. No answer. Left message. °

## 2022-02-18 NOTE — Progress Notes (Signed)
Office Visit    Patient Name: Denise Aguirre Date of Encounter: 02/19/2022  PCP:  Lind Covert, MD   [place]  Cardiologist:  Jenkins Rouge, MD  Advanced Practice Provider:  No care team member to display Electrophysiologist:  None   HPI    Denise Aguirre is a 62 y.o. female with a past medical history of chest pain, prediabetes (A1c 5.7), HLD on Lipitor, GERD, duodenal erosions, and gastric polyps presents today for follow-up appointment.  Patient was seen last year for chest pain.  She stated that her husband had no history of CAD an his test had all been fine but then he ended up getting CABG x 3.  She stated that her chest pain made her think of her husband and she wanted to get a cardiac CT scan to rule out CAD.  Chest pain was described as pressure on the left side that comes and goes during rest and activity that is been going on for couple months.  Patient stated sometimes her heart was racing at times but not always.  Patient denies shortness of breath or any other symptoms.  She had tripped recently and hit her head.  She had muscular/bone pain sternum from the fall.  She has a daughter in medical school at [employer] and her son just finished anesthesia residency in [place] and she has a son in [place] and see that does Programmer, applications.  Her husband ended up having CABG in [place].  Cardiac CTA was ordered and she was premedicated for contrast allergy.  Coronary CTA and FFR flow analysis demonstrated no significant hemodynamically flow-limiting lesions (03/16/2021).   Today, she tells me that every night since 12/27 she is having chest pain, palpitations, nausea, and a warm sensation. She now is having occasional chest pressure and left arm pain throughout the day. She has a family history of afib and CAD. She feels her symptoms are getting worse. Troponin x 1 was unremarkable in the ED. She was given asa and discharged home  with outpatient follow-up. Since she is still very symptomatic we discussed a full workup.  Reports no shortness of breath nor dyspnea on exertion.  No edema, orthopnea, PND.   Past Medical History    Past Medical History:  Diagnosis Date   Allergy    seasonal allergies   Arthritis    bilateral hands   Cystitis, interstitial    chronic since 1999   Diverticulitis    GERD (gastroesophageal reflux disease)    Osteopenia    Osteoporosis    Other malaise and fatigue    Screening cholesterol level    Past Surgical History:  Procedure Laterality Date   COLONOSCOPY  2017   MS-MAC-adeq with suctioning and ext rinsing-TA x 1   dental biopsy     right buccal mucosa    INTERSTIM IMPLANT PLACEMENT     and removal in 2008   [place] Right    with plate    Allergies  Allergies  Allergen Reactions   Ivp Dye [Iodinated Contrast Media] Rash     Hives. itching   Topiramate Nausea And Vomiting     EKGs/Labs/Other Studies Reviewed:   The following studies were reviewed today:  Coronary CTA 03/16/21 Narrative & Impression  EXAM: FFRCT ANALYSIS  FINDINGS: FFRct analysis was performed on the original cardiac CT angiogram dataset. Diagrammatic representation of the FFRct analysis is provided in a separate PDF document in PACS. This dictation was created using the PDF document and an interactive 3D model of the results. 3D model is not available in the EMR/PACS. Normal FFR range is >0.80.   1. Left Main: No significant stenosis.  LM FFR = 0.99.   2. LAD: No significant stenosis. Proximal FFR = 0.98, Mid FFR = 0.93, Distal FFR = 0.85.   3. LCX: No significant stenosis. Proximal FFR = 0.98, Mid FFR = 0.95, Distal FFR = 0.92.   4. RCA: No significant stenosis. Proximal FFR = 0.99, Mid FFR = 0.96, Distal FFR = 0.95.   IMPRESSION: 1. Coronary CTA FFR  flow analysis demonstrates no significant hemodynamically flow limiting lesions.   Fransico Him, MD     Electronically Signed   By: Fransico Him M.D.   On: 03/16/2021 22:00    EKG:  EKG is not ordered today.    Recent Labs: 12/01/2021: TSH 1.750 01/15/2022: ALT 15 02/16/2022: BUN 15; Creatinine, Ser 0.70; Hemoglobin 14.1; Platelets 244; Potassium 3.4; Sodium 139  Recent Lipid Panel    Component Value Date/Time   CHOL 184 12/01/2021 1620   TRIG 44 12/01/2021 1620   HDL 66 12/01/2021 1620   CHOLHDL 2.8 12/01/2021 1620   CHOLHDL 3.6 07/02/2015 1147   VLDL 18 07/02/2015 1147   LDLCALC 109 (H) 12/01/2021 1620    Home Medications   Current Meds  Medication Sig   atorvastatin (LIPITOR) 40 MG tablet TAKE 1 TABLET(40 MG) BY MOUTH DAILY   Calcium Carb-Cholecalciferol 600-200 MG-UNIT TABS Take 2 tablets by mouth daily.   co-enzyme Q-10 50 MG capsule Take 50 mg by mouth daily.   Docusate Calcium (STOOL SOFTENER PO) Take 2 capsules by mouth at bedtime.   methadone (DOLOPHINE) 5 MG tablet Take 5 mg by mouth 2 (two) times daily.   naloxone (NARCAN) nasal spray 4 mg/0.1 mL as needed.   Omega-3 Fatty Acids (FISH OIL PO) Take 2 capsules by mouth daily.    pantoprazole (PROTONIX) 40 MG tablet Take 1 tablet (40 mg total) by mouth 2 (two) times daily.   polyethylene glycol powder (GLYCOLAX/MIRALAX) 17 GM/SCOOP powder Take 17 g by mouth daily. (Patient taking differently: Take 17 g by mouth as needed.)   [DISCONTINUED] glycopyrrolate (ROBINUL) 2 MG tablet TAKE 1 TABLET(2 MG) BY MOUTH TWICE DAILY     Review of Systems      All other systems reviewed and are otherwise negative except as noted above.  Physical Exam    VS:  BP (!) 144/92 Comment: BP 130/90  Pulse 83   Ht _0  (1.651 m)   Wt 155 lb (70.3 kg)   LMP 09/06/2013   SpO2 96%   BMI 25.79 kg/m  , BMI Body mass index is 25.79 kg/m.  Wt Readings from Last 3 Encounters:  02/19/22 155 lb (70.3 kg)  02/17/22 154 lb (69.9 kg)   02/16/22 154 lb 3.2 oz (69.9 kg)     GEN: Well nourished, well developed, in no acute distress. HEENT: normal. Neck: Supple, no JVD, carotid bruits, or masses. Cardiac: RRR, no murmurs, rubs, or gallops. No clubbing, cyanosis, edema.  Radials/PT 2+ and equal bilaterally.  Respiratory:  Respirations regular and unlabored, clear to auscultation bilaterally. GI: Soft, nontender, nondistended. MS: No deformity or atrophy. Skin: Warm and dry, no rash. Neuro:  Strength and sensation are intact.  Psych: Normal affect.  Assessment & Plan    Atypical chest pain with no limiting flow based on CTA with FFR (03/16/21) -chest pressure now with left arm pain -BP slightly elevated today in the clinic -will plan for coronary CTA in the next few weeks after monitor and echo -PRN nitro ordered -asa 81 mg daily -added metoprolol succinate 12.5 mg  Palpitations -sometimes associated with chest pain -will order a 7 day live zio monitor to be placed after echo -metoprolol succinate 12.26m daily added -limit caffeine and increase hydration -labs today include TSH, BMP, and mag  Hypertension -continue to monitor at home -slightly elevated here in the clinic -added metoprolol today  HLD -LDL 109 -if she has coronary disease we would need to drive this down to < 70 -plan for repeat of lipid panel at her next visit depending on results    Disposition: Follow up 1 month with PJenkins Rouge MD or APP.  Signed, TElgie Collard PA-C 02/19/2022, 9:50 AM [place]

## 2022-02-19 ENCOUNTER — Ambulatory Visit: Payer: Medicaid Other | Attending: Physician Assistant | Admitting: Physician Assistant

## 2022-02-19 ENCOUNTER — Encounter: Payer: Self-pay | Admitting: Physician Assistant

## 2022-02-19 ENCOUNTER — Ambulatory Visit: Payer: Medicaid Other | Attending: Physician Assistant

## 2022-02-19 VITALS — BP 144/92 | HR 83 | Ht 65.0 in | Wt 155.0 lb

## 2022-02-19 DIAGNOSIS — R7303 Prediabetes: Secondary | ICD-10-CM

## 2022-02-19 DIAGNOSIS — R072 Precordial pain: Secondary | ICD-10-CM | POA: Diagnosis not present

## 2022-02-19 DIAGNOSIS — R002 Palpitations: Secondary | ICD-10-CM

## 2022-02-19 DIAGNOSIS — K2100 Gastro-esophageal reflux disease with esophagitis, without bleeding: Secondary | ICD-10-CM | POA: Diagnosis not present

## 2022-02-19 DIAGNOSIS — E785 Hyperlipidemia, unspecified: Secondary | ICD-10-CM | POA: Diagnosis not present

## 2022-02-19 LAB — BASIC METABOLIC PANEL
BUN/Creatinine Ratio: 27 (ref 12–28)
BUN: 17 mg/dL (ref 8–27)
CO2: 24 mmol/L (ref 20–29)
Calcium: 10 mg/dL (ref 8.7–10.3)
Chloride: 102 mmol/L (ref 96–106)
Creatinine, Ser: 0.64 mg/dL (ref 0.57–1.00)
Glucose: 119 mg/dL — ABNORMAL HIGH (ref 70–99)
Potassium: 4.1 mmol/L (ref 3.5–5.2)
Sodium: 140 mmol/L (ref 134–144)
eGFR: 100 mL/min/{1.73_m2} (ref >59)

## 2022-02-19 LAB — TSH: TSH: 1.76 u[IU]/mL (ref 0.450–4.500)

## 2022-02-19 LAB — MAGNESIUM: Magnesium: 2.1 mg/dL (ref 1.6–2.3)

## 2022-02-19 MED ORDER — PREDNISONE 50 MG PO TABS: ORAL_TABLET | ORAL | 0 refills | Status: DC

## 2022-02-19 MED ORDER — NITROGLYCERIN 0.4 MG SL SUBL: 0.4000 mg | SUBLINGUAL_TABLET | SUBLINGUAL | 3 refills | Status: DC | PRN

## 2022-02-19 MED ORDER — METOPROLOL TARTRATE 100 MG PO TABS: ORAL_TABLET | ORAL | 0 refills | Status: DC

## 2022-02-19 MED ORDER — METOPROLOL SUCCINATE ER 25 MG PO TB24: 12.5000 mg | ORAL_TABLET | Freq: Every day | ORAL | 3 refills | Status: DC

## 2022-02-19 MED ORDER — ASPIRIN 81 MG PO TBEC: 81.0000 mg | DELAYED_RELEASE_TABLET | Freq: Every day | ORAL | 3 refills | Status: DC

## 2022-02-19 NOTE — Addendum Note (Signed)
Addended by: Juventino Slovak on: 02/19/2022 10:16 AM   Modules accepted: Orders

## 2022-02-19 NOTE — Progress Notes (Unsigned)
Applied a 7 day Zio XT monitor to patient in the office  Dr Johnsie Cancel to read

## 2022-02-19 NOTE — Patient Instructions (Signed)
Medication Instructions:  1.Start over the counter aspirin 81 mg daily 2.Start metoprolol succinate 12.5 mg daily, this will be 1/2 of a 25 mg tablet 3.Start nitroglycerin 0.4 mg sublingual tablet, place one tablet under the tongue as needed for chest pain, if no relief after 3 doses, call 911 *If you need a refill on your cardiac medications before your next appointment, please call your pharmacy*   Lab Work: BMP, Mag and TSH today If you have labs (blood work) drawn today and your tests are completely normal, you will receive your results only by: [place] (if you have MyChart) OR A paper copy in the mail If you have any lab test that is abnormal or we need to change your treatment, we will call you to review the results.   Testing/Procedures: Your physician has requested that you have an echocardiogram, please schedule next available. Echocardiography is a painless test that uses sound waves to create images of your heart. It provides your doctor with information about the size and shape of your heart and how well your heart's chambers and valves are working. This procedure takes approximately one hour. There are no restrictions for this procedure. Please do NOT wear cologne, perfume, aftershave, or lotions (deodorant is allowed). Please arrive 15 minutes prior to your appointment time.    Your cardiac CT will be scheduled at one of the below locations:   [facility] [address] [place], [place] [address] (336) [place] [address] [place], [place] [address] [phone]  [facility] [place], [place] [address] [phone]  If scheduled at [facility], please arrive at the [facility] En[facility] (Entrance C2) of [facility] 30 minutes prior to test start time. You can use the FREE valet parking offered at entrance C  (encouraged to control the heart rate for the test)  Proceed to the [facility] Radiology Department (first floor) to check-in and test prep.  All radiology patients and guests should use entrance C2 at [facility], accessed from [facility], even though the hospital's physical address listed is [address].    If scheduled at [facility] or [facility], please arrive 15 mins early for check-in and test prep.   Please follow these instructions carefully (unless otherwise directed):  Hold all erectile dysfunction medications at least 3 days (72 hrs) prior to test. (Ie viagra, cialis, sildenafil, tadalafil, etc) We will administer nitroglycerin during this exam.   On the Night Before the Test: Be sure to Drink plenty of water. Do not consume any caffeinated/decaffeinated beverages or chocolate 12 hours prior to your test. Do not take any antihistamines 12 hours prior to your test. If the patient has contrast allergy: Patient will need a prescription for Prednisone and very clear instructions (as follows): Prednisone 50 mg - take 13 hours prior to test Take another Prednisone 50 mg 7 hours prior to test Take another Prednisone 50 mg 1 hour prior to test Take over the counter Benadryl 50 mg 1 hour prior to test Patient must complete all four doses of above prophylactic medications. Patient will need a ride after test due to Benadryl.  On the Day of the Test: Drink plenty of water until 1 hour prior to the test. Do not eat any food 1 hour prior to test. You may take your regular  medications prior to the test.  Take metoprolol (Lopressor) 100 mg two hours prior to test. HOLD Furosemide/Hydrochlorothiazide morning of the test. FEMALES- please wear underwire-free bra if available, avoid dresses & tight clothing       After the Test: Drink plenty of water. After receiving IV contrast, you may experience a mild  flushed feeling. This is normal. On occasion, you may experience a mild rash up to 24 hours after the test. This is not dangerous. If this occurs, you can take Benadryl 25 mg and increase your fluid intake. If you experience trouble breathing, this can be serious. If it is severe call 911 IMMEDIATELY. If it is mild, please call our office. If you take any of these medications: Glipizide/Metformin, Avandament, Glucavance, please do not take 48 hours after completing test unless otherwise instructed.  We will call to schedule your test 2-4 weeks out understanding that some insurance companies will need an authorization prior to the service being performed.   For non-scheduling related questions, please contact the cardiac imaging nurse navigator should you have any questions/concerns: Marchia Bond, Cardiac Imaging Nurse Navigator Gordy Clement, Cardiac Imaging Nurse Navigator [facility] Direct Office Dial: [phone]   For scheduling needs, including cancellations and rescheduling, please call [place], [phone].   ZIO AT Long term monitor-Live Telemetry  Your physician has requested you wear a ZIO patch monitor for 7 days.  This is a single patch monitor. [employer] supplies one patch monitor per enrollment. Additional  stickers are not available.  Please do not apply patch if you will be having a Nuclear Stress Test, Echocardiogram, Cardiac CT, MRI,  or Chest Xray during the period you would be wearing the monitor. The patch cannot be worn during  these tests. You cannot remove and re-apply the ZIO AT patch monitor.  Your ZIO patch monitor will be mailed 3 day USPS to your address on file. It may take 3-5 days to  receive your monitor after you have been enrolled.  Once you have received your monitor, please review the enclosed instructions. Your monitor has  already been registered assigning a specific monitor serial # to you.   Billing and Patient  Assistance Program information  Theodore Demark has been supplied with any insurance information on record for billing. [employer] offers a sliding scale Patient Assistance Program for patients without insurance, or whose  insurance does not completely cover the cost of the ZIO patch monitor. You must apply for the  Patient Assistance Program to qualify for the discounted rate. To apply, call [employer] at [phone],  select option 4, select option 2 , ask to apply for the Patient Assistance Program, (you can request an  interpreter if needed). [employer] will ask your household income and how many people are in your  household. [employer] will quote your out-of-pocket cost based on this information. They will also be able  to set up a 12 month interest free payment plan if needed.  Applying the monitor   Shave hair from upper left chest.  Hold the abrader disc by orange tab. Rub the abrader in 40 strokes over left upper chest as indicated in  your monitor instructions.  Clean area with 4 enclosed alcohol pads. Use all pads to ensure the area is cleaned thoroughly. Let  dry.  Apply patch as indicated in monitor instructions. Patch will be placed under collarbone on left side of  chest with arrow pointing upward.  Rub patch adhesive wings for 2 minutes. Remove the  white label marked "1". Remove the white label  marked "2". Rub patch adhesive wings for 2 additional minutes.  While looking in a mirror, press and release button in center of patch. A small green light will flash 3-4  times. This will be your only indicator that the monitor has been turned on.  Do not shower for the first 24 hours. You may shower after the first 24 hours.  Press the button if you feel a symptom. You will hear a small click. Record Date, Time and Symptom in  the Patient Log.   Starting the Gateway  In your kit there is a Hydrographic surveyor box the size of a cellphone. This is Airline pilot. It transmits all your  recorded data to  [facility]. This box must always stay within 10 feet of you. Open the box and push the *  button. There will be a light that blinks orange and then green a few times. When the light stops  blinking, the Gateway is connected to the ZIO patch. Call [employer] at [phone] to confirm your monitor is transmitting.  Returning your monitor  Remove your patch and place it inside the [place]. In the lower half of the Gateway there is a white  bag with prepaid postage on it. Place Gateway in bag and seal. Mail package back to [place] as soon as  possible. Your physician should have your final report approximately 7 days after you have mailed back  your monitor. Call [place] at [phone] if you have questions regarding your ZIO AT  patch monitor. Call them immediately if you see an orange light blinking on your monitor.  If your monitor falls off in less than 4 days, contact our Monitor department at [phone]. If your  monitor becomes loose or falls off after 4 days call [employer] at [phone] for suggestions on  securing your monitor    Follow-Up: At [facility], you and your health needs are our priority.  As part of our continuing mission to provide you with exceptional heart care, we have created designated Provider Care Teams.  These Care Teams include your primary Cardiologist (physician) and Advanced Practice Providers (APPs -  Physician Assistants and Nurse Practitioners) who all work together to provide you with the care you need, when you need it.  Your next appointment:   4-6 week(s)  The format for your next appointment:   In Person  Provider:   Jenkins Rouge, MD     Important Information About [place]

## 2022-02-19 NOTE — Addendum Note (Signed)
Addended by: Juventino Slovak on: 02/19/2022 10:19 AM   Modules accepted: Orders

## 2022-02-22 ENCOUNTER — Telehealth: Payer: Self-pay | Admitting: Physician Assistant

## 2022-02-22 NOTE — Telephone Encounter (Signed)
Pt c/o medication issue:  1. Name of Medication: metoprolol succinate (TOPROL XL) 25 MG 24 hr tablet   2. How are you currently taking this medication (dosage and times per day)? Wants to verify if she needs to start the medication  3. Are you having a reaction (difficulty breathing--STAT)? no  4. What is your medication issue? Patient states she is wearing a heart monitor and is not sure if she should wait to start the medication. She says the monitor is supposed to catch her HR when it races, but if she takes the medication it may stop her heart from racing. She also says she was told when she had symptoms to push the button on the monitor and write down the symptoms in her booklet. She says she did not have the booklet, because she forgot it and wants to make sure it is okay that she could not write in it for a few days.

## 2022-02-22 NOTE — Telephone Encounter (Signed)
Spoke with patient and made her aware of msg per Tessa. She states her understanding and agrees with this plan.

## 2022-02-24 ENCOUNTER — Telehealth (HOSPITAL_COMMUNITY): Payer: Self-pay | Admitting: *Deleted

## 2022-02-24 ENCOUNTER — Ambulatory Visit (HOSPITAL_COMMUNITY): Payer: Medicaid Other | Attending: Internal Medicine

## 2022-02-24 DIAGNOSIS — I361 Nonrheumatic tricuspid (valve) insufficiency: Secondary | ICD-10-CM

## 2022-02-24 DIAGNOSIS — R002 Palpitations: Secondary | ICD-10-CM | POA: Insufficient documentation

## 2022-02-24 LAB — ECHOCARDIOGRAM COMPLETE
Area-P 1/2: 2.74 cm2
S' Lateral: 2.1 cm

## 2022-02-24 NOTE — Telephone Encounter (Signed)
Reaching out to patient to offer assistance regarding upcoming cardiac imaging study; pt verbalizes understanding of appt date/time, parking situation and where to check in, pre-test NPO status and medications ordered, and verified current allergies; name and call back number provided for further questions should they arise  Gordy Clement RN Navigator [facility] [facility] [phone] office [phone] cell  Reviewed how to take 13 hour prep and metoprolol tartrate with patient. She verbalized understanding and is aware to arrive at 2:30pm.

## 2022-02-25 ENCOUNTER — Ambulatory Visit (HOSPITAL_COMMUNITY)
Admission: RE | Admit: 2022-02-25 | Discharge: 2022-02-25 | Disposition: A | Discharge status: Home / Self Care | Payer: Medicaid Other | Source: Ambulatory Visit | Attending: Physician Assistant | Admitting: Physician Assistant

## 2022-02-25 ENCOUNTER — Telehealth: Payer: Self-pay | Admitting: Physician Assistant

## 2022-02-25 DIAGNOSIS — R072 Precordial pain: Secondary | ICD-10-CM | POA: Insufficient documentation

## 2022-02-25 MED ORDER — METOPROLOL TARTRATE 5 MG/5ML IV SOLN: 10.0000 mg | INTRAVENOUS | Status: DC | PRN

## 2022-02-25 MED ORDER — NITROGLYCERIN 0.4 MG SL SUBL
0.8000 mg | SUBLINGUAL_TABLET | Freq: Once | SUBLINGUAL | Status: AC
  Administered 2022-02-25: 0.8 mg via SUBLINGUAL

## 2022-02-25 MED ORDER — METOPROLOL TARTRATE 5 MG/5ML IV SOLN
INTRAVENOUS | Status: AC
  Filled 2022-02-25: qty 10

## 2022-02-25 MED ORDER — NITROGLYCERIN 0.4 MG SL SUBL
SUBLINGUAL_TABLET | SUBLINGUAL | Status: AC
  Filled 2022-02-25: qty 2

## 2022-02-25 MED ORDER — IOHEXOL 350 MG/ML SOLN
100.0000 mL | Freq: Once | INTRAVENOUS | Status: AC | PRN
  Administered 2022-02-25: 100 mL via INTRAVENOUS

## 2022-02-25 NOTE — Telephone Encounter (Signed)
Patient c/o Palpitations:  High priority if patient c/o lightheadedness, shortness of breath, or chest pain  How long have you had palpitations/irregular HR/ Afib? Are you having the symptoms now? Since before 01/05 appt   Are you currently experiencing lightheadedness, SOB or CP? No   Do you have a history of afib (atrial fibrillation) or irregular heart rhythm? No   Have you checked your BP or HR? (document readings if available): No   Are you experiencing any other symptoms?  Chest pain when heart racing occurs and SOB  Patient is calling stating the symptoms she was seen for on 01/05 have began occurring throughout the day. She states when she was seen this was something that was occurring at night. Due to this patient requested an appt with [facility] ASAP. Patient was scheduled for 01/15. She reports she had an episode of her heart racing prior to her CT today. Patient denied any symptoms at time of call. Please advise.

## 2022-02-25 NOTE — Telephone Encounter (Signed)
I spoke with patient. She reports more episodes of increased heart rate since Monday. Heart rate goes up to 145 when walking around the house and out to the mailbox.  Felt like she might pass out on occasion. She has chest pain and shortness of breath when heart rate is elevated. Took lopressor today prior to Cardiac CT.  Also took prednisone due to dye allergy today. Toprol has been ordered for patient but she states it was decided she should not start until after wearing the monitor.  She will finish wearing the monitor tomorrow and has been activating monitor when having elevated heart rates. I told patient she could go ahead and start Toprol now due to her symptoms. Advised to stay hydrated and change positions slowly.  Patient has scheduled appointment with Nicholes Rough, PA for 1/15 but is asking if she needs sooner appointment.

## 2022-02-26 ENCOUNTER — Ambulatory Visit (INDEPENDENT_AMBULATORY_CARE_PROVIDER_SITE_OTHER): Payer: Medicaid Other | Admitting: Family Medicine

## 2022-02-26 ENCOUNTER — Encounter: Payer: Self-pay | Admitting: Family Medicine

## 2022-02-26 VITALS — BP 118/60 | HR 71 | Ht 65.0 in | Wt 155.0 lb

## 2022-02-26 DIAGNOSIS — H9312 Tinnitus, left ear: Secondary | ICD-10-CM | POA: Diagnosis not present

## 2022-02-26 DIAGNOSIS — R002 Palpitations: Secondary | ICD-10-CM

## 2022-02-26 NOTE — Assessment & Plan Note (Signed)
Fairly extensive work up thus far in short time span by emergency medicine and cardiology. Differential is broad, but includes arrhythmia, Prinzmetal's angina, other cardiac ischemia, electrolyte derangement, anxiety, and adrenal tumor. I believe paroxysmal A. Fib is the most likely given clinical presentation, normal labs/ECHO/cardiac CT thus far, and family history. Will await zio patch results. If not arrhythmia, then we can pursue adrenal work up.

## 2022-02-26 NOTE — Progress Notes (Signed)
SUBJECTIVE:   CHIEF COMPLAINT / HPI:   Ear complaint 10 day duration Buzzing of left ear only No fullness, pain, drainage No prodromal illness, including URI  SOB, rapid heart beat, occasional chest pain 2 weeks duration, started late December Initial intermittent nocturnal symptoms: Wake up with racing heart, chest pain, left arm pain, nausea, pre-syncope Now happening more frequently and during day, such as SOB with exertion In between episodes, does not notice SOB or other breathing difficulty, chest pain She has children in medicine (anesthesiologist and a med student), they wanted her to ask if she needs a D-dimer.  Family history:  Two sisters with A. Fib  One brother with heart block requiring pacemaker and ICD ED workup 1/02:  EKG unremarkable BMP, CBC wnl Trops 2 and 2 Cardiology workup: 1/05 BMP, Mag, TSH wnl 1/05 EKG unremarkable 1/10 ECHO grossly normal, some age-related changes, no clear etiology 1/11 CT coronary morph shows calcium score 3.36 (64th %ile adjusted), right-dominant coronary perfusion, minimal (<25%) calcified plaque in LCX (CAD-RADS 1) 1/11 CT chest over-read negative for acute findings, normal structures 1/05 - 1/12 Zio patch, patient will mail in tomorrow 1/15 upcoming cardiology appointment for follow up  PERTINENT  PMH / Revloc:  Patient Active Problem List   Diagnosis Date Noted   Tinnitus of left ear 02/26/2022   Seasonal allergic conjunctivitis 06/16/2021   Prediabetes 10/30/2020   Dense breast tissue 12/11/2019   Insomnia 12/11/2019   Hyperlipidemia 10/30/2019   Osteopenia 10/30/2019   Intertrigo 10/30/2019   Plantar fasciitis 08/04/2016   Facet arthropathy, lumbar 06/19/2015   Slow transit constipation 11/13/2014   Pain syndrome, chronic 11/05/2014   Vitamin D deficiency 06/26/2014   Abdominal pain 02/23/2011   Palpitations 08/17/2007   Interstitial cystitis 04/14/2006    OBJECTIVE:   BP 118/60   Pulse 71   Ht '5\' 5"'$   (1.651 m)   Wt 155 lb (70.3 kg)   LMP 09/06/2013   SpO2 99%   BMI 25.79 kg/m    PHQ-9:     02/26/2022   10:48 AM 02/04/2022    9:12 AM 01/15/2022    2:55 PM  Depression screen PHQ 2/9  Decreased Interest 0 0 0  Down, Depressed, Hopeless 0 0 0  PHQ - 2 Score 0 0 0  Altered sleeping 0 0 0  Tired, decreased energy 0 0 0  Change in appetite 0 0 0  Feeling bad or failure about yourself  0 0 0  Trouble concentrating 0 0 0  Moving slowly or fidgety/restless 0 0 0  Suicidal thoughts 0 0 0  PHQ-9 Score 0 0 0    Physical Exam General: Awake, alert, oriented HEENT: left auditory canal completely occluded by cerumen, right canal clear, normal pearly pink right TM Cardiovascular: Regular rate and rhythm, S1 and S2 present, no murmurs auscultated Respiratory: Lung fields clear to auscultation bilaterally Extremities: No bilateral lower extremity edema, palpable pedal and pretibial pulses bilaterally Neuro: Cranial nerves II through X grossly intact, able to move all extremities spontaneously   ASSESSMENT/PLAN:   Tinnitus of left ear Occluded by cerumen, improved greatly after canal flush by Jazmin Hartsell, CMA. Recommend debrox or similar product to keep cerumen burden down.   Palpitations Fairly extensive work up thus far in short time span by emergency medicine and cardiology. Differential is broad, but includes arrhythmia, Prinzmetal's angina, other cardiac ischemia, electrolyte derangement, anxiety, and adrenal tumor. I believe paroxysmal A. Fib is the most likely given clinical presentation,  normal labs/ECHO/cardiac CT thus far, and family history. Will await zio patch results. If not arrhythmia, then we can pursue adrenal work up.    Ezequiel Essex, MD [place]

## 2022-02-26 NOTE — Telephone Encounter (Signed)
Reviewed with Denise Rough, PA and OK to continue with plan for appointment on 1/15.   I spoke with patient and let her know Johann Capers was in agreement with starting metoprolol and following up in office on Monday.  Patient is scheduled to see PCP today.

## 2022-02-26 NOTE — Assessment & Plan Note (Signed)
Occluded by cerumen, improved greatly after canal flush by Jazmin Hartsell, CMA. Recommend debrox or similar product to keep cerumen burden down.

## 2022-02-26 NOTE — Patient Instructions (Signed)
It was wonderful to see you today. Thank you for allowing me to be a part of your care. Below is a short summary of what we discussed at your visit today:  Palpitations, shortness of breath, chest pain, dizziness Based on what you are telling me about these episodes, I am most concerned for abnormal fast heart rhythm such as A. Fib.  We need to wait and see what the results are from your Holter monitor.  I do not have any tests to add in the meantime.  I do not think you need a D-dimer because this happens in episodes, you are not on estrogens, you do not have history of DVT, you have not been recently injured or immobilized, and no recent surgery. If this keeps happening and the Holter monitor does not show anything, we can order this test.   Ear complaint Your left ear was occluded by ear wax. Today we flushed it out. You can use over the counter Debrox or similar products to keep the ear wax burden low.    If you have any questions or concerns, please do not hesitate to contact us via phone or MyChart message.   Ezequiel Essex, MD

## 2022-02-26 NOTE — Telephone Encounter (Signed)
Elgie Collard, PA-C  Thompson Grayer, RN  Thank you for letting me know. Good decision to go forward with starting metoprolol.

## 2022-02-27 ENCOUNTER — Other Ambulatory Visit: Payer: Self-pay | Admitting: Physician Assistant

## 2022-02-28 NOTE — Progress Notes (Unsigned)
Office Visit    Patient Name: Denise Aguirre Date of Encounter: 02/28/2022  PCP:  Lind Covert, MD   [place]  Cardiologist:  Jenkins Rouge, MD  Advanced Practice Provider:  No care team member to display Electrophysiologist:  None   HPI    Denise Aguirre is a 62 y.o. female with a past medical history of chest pain, prediabetes (A1c 5.7), HLD on Lipitor, GERD, duodenal erosions, and gastric polyps presents today for follow-up appointment.  Patient was seen last year for chest pain.  She stated that her husband had no history of CAD an his test had all been fine but then he ended up getting CABG x 3.  She stated that her chest pain made her think of her husband and she wanted to get a cardiac CT scan to rule out CAD.  Chest pain was described as pressure on the left side that comes and goes during rest and activity that is been going on for couple months.  Patient stated sometimes her heart was racing at times but not always.  Patient denies shortness of breath or any other symptoms.  She had tripped recently and hit her head.  She had muscular/bone pain sternum from the fall.  She has a daughter in medical school at [employer] and her son just finished anesthesia residency in [place] and she has a son in [place] and see that does Programmer, applications.  Her husband ended up having CABG in [place].  Cardiac CTA was ordered and she was premedicated for contrast allergy.  Coronary CTA and FFR flow analysis demonstrated no significant hemodynamically flow-limiting lesions (03/16/2021).   She was last seen by me 1/5 and she tells me that every night since 12/27 she is having chest pain, palpitations, nausea, and a warm sensation. She now is having occasional chest pressure and left arm pain throughout the day. She has a family history of afib and CAD. She feels her symptoms are getting worse. Troponin x 1 was unremarkable in the ED. She was  given asa and discharged home with outpatient follow-up. Since she is still very symptomatic we discussed a full workup.  Today, she tells me that her heart races a lot when she does activity.  She was worried on Friday because she had an episode and since she was turning in her ZIO monitor we went ahead and told her to start her metoprolol.  She has not started her metoprolol because she wanted to discuss it with me today.  We discussed that her monitor will likely show episodes of SVT but will help [place] quantify her burden.  Would still recommend starting metoprolol today and monitoring symptoms.  We will titrate as we can while monitoring her blood pressure closely.  She may ultimately end up with an EP referral if she cannot be well-controlled with medications.  She is very symptomatic with her palpitations and always has shortness of breath, feels like she is going to pass out, and has chest pain.  Echocardiogram and coronary CTA results reviewed with patient today.  No edema, orthopnea, PND.   Past Medical History    Past Medical History:  Diagnosis Date   Allergy    seasonal allergies   Arthritis    bilateral hands   Cystitis, interstitial    chronic since 1999   Diverticulitis    GERD (gastroesophageal reflux disease)    Osteopenia    Osteoporosis    Other malaise and fatigue  Screening cholesterol level    Past Surgical History:  Procedure Laterality Date   COLONOSCOPY  2017   MS-MAC-adeq with suctioning and ext rinsing-TA x 1   dental biopsy     right buccal mucosa    INTERSTIM IMPLANT PLACEMENT     and removal in 2008   Sorrel Right    with plate    Allergies  Allergies  Allergen Reactions   Ivp Dye [Iodinated Contrast Media] Rash     Hives. itching   Topiramate Nausea And Vomiting     EKGs/Labs/Other Studies Reviewed:   The  following studies were reviewed today:  Echocardiogram 02/24/22 IMPRESSIONS     1. Left ventricular ejection fraction, by estimation, is 55 to 60%. The  left ventricle has normal function. The left ventricle has no regional  wall motion abnormalities. Left ventricular diastolic parameters are  consistent with Grade I diastolic  dysfunction (impaired relaxation).   2. Right ventricular systolic function is normal. The right ventricular  size is normal. There is normal pulmonary artery systolic pressure. The  estimated right ventricular systolic pressure is 50.3 mmHg.   3. The mitral valve is abnormal. Trivial mitral valve regurgitation.   4. The aortic valve is tricuspid. Aortic valve regurgitation is not  visualized.   5. The inferior vena cava is normal in size with greater than 50%  respiratory variability, suggesting right atrial pressure of 3 mmHg.   Comparison(s): Prior echo was not available for comparison.   FINDINGS   Left Ventricle: Left ventricular ejection fraction, by estimation, is 55  to 60%. The left ventricle has normal function. The left ventricle has no  regional wall motion abnormalities. The left ventricular internal cavity  size was normal in size. There is   no left ventricular hypertrophy. Left ventricular diastolic parameters  are consistent with Grade I diastolic dysfunction (impaired relaxation).  Normal left ventricular filling pressure.   Right Ventricle: The right ventricular size is normal. No increase in  right ventricular wall thickness. Right ventricular systolic function is  normal. There is normal pulmonary artery systolic pressure. The tricuspid  regurgitant velocity is 2.53 m/s, and   with an assumed right atrial pressure of 3 mmHg, the estimated right  ventricular systolic pressure is 54.6 mmHg.   Left Atrium: Left atrial size was normal in size.   Right Atrium: Right atrial size was normal in size.   Pericardium: There is no evidence of  pericardial effusion.   Mitral Valve: The mitral valve is abnormal. There is mild thickening of  the anterior and posterior mitral valve leaflet(s). Trivial mitral valve  regurgitation.   Tricuspid Valve: The tricuspid valve is grossly normal. Tricuspid valve  regurgitation is mild.   Aortic Valve: The aortic valve is tricuspid. Aortic valve regurgitation is  not visualized.   Pulmonic Valve: The pulmonic valve was grossly normal. Pulmonic valve  regurgitation is trivial.   Aorta: The aortic root and ascending aorta are structurally normal, with  no evidence of dilitation.   Venous: The inferior vena cava is normal in size with greater than 50%  respiratory variability, suggesting right atrial pressure of 3 mmHg.   IAS/Shunts: No atrial level shunt detected by color flow Doppler.     LEFT VENTRICLE  PLAX 2D  LVIDd:  3.50 cm   Diastology  LVIDs:         2.10 cm   LV e' medial:    9.90 cm/s  LV PW:         1.00 cm   LV E/e' medial:  6.4  LV IVS:        0.90 cm   LV e' lateral:   11.00 cm/s  LVOT diam:     1.90 cm   LV E/e' lateral: 5.8  LV SV:         51  LV SV Index:   29  LVOT Area:     2.84 cm     RIGHT VENTRICLE  RV Basal diam:  3.50 cm  RV S prime:     12.10 cm/s  TAPSE (M-mode): 1.8 cm  RVSP:           28.6 mmHg   LEFT ATRIUM             Index        RIGHT ATRIUM           Index  LA diam:        2.60 cm 1.46 cm/m   RA Pressure: 3.00 mmHg  LA Vol (A2C):   46.6 ml 26.25 ml/m  RA Area:     12.20 cm  LA Vol (A4C):   28.5 ml 16.06 ml/m  RA Volume:   27.60 ml  15.55 ml/m  LA Biplane Vol: 36.9 ml 20.79 ml/m   AORTIC VALVE             PULMONIC VALVE  LVOT Vmax:   86.20 cm/s  PR End Diast Vel: 2.33 msec  LVOT Vmean:  55.200 cm/s  LVOT VTI:    0.180 m    AORTA  Ao Root diam: 3.20 cm  Ao Asc diam:  3.40 cm   MITRAL VALVE               TRICUSPID VALVE  MV Area (PHT): 2.74 cm    TR Peak grad:   25.6 mmHg  MV Decel Time: 277 msec    TR Vmax:         253.00 cm/s  MV E velocity: 63.40 cm/s  Estimated RAP:  3.00 mmHg  MV A velocity: 72.30 cm/s  RVSP:           28.6 mmHg  MV E/A ratio:  0.88                             SHUNTS          Coronary CTA 03/16/21 Narrative & Impression  EXAM: FFRCT ANALYSIS   FINDINGS: FFRct analysis was performed on the original cardiac CT angiogram dataset. Diagrammatic representation of the FFRct analysis is provided in a separate PDF document in PACS. This dictation was created using the PDF document and an interactive 3D model of the results. 3D model is not available in the EMR/PACS. Normal FFR range is >0.80.   1. Left Main: No significant stenosis.  LM FFR = 0.99.   2. LAD: No significant stenosis. Proximal FFR = 0.98, Mid FFR = 0.93, Distal FFR = 0.85.   3. LCX: No significant stenosis. Proximal FFR = 0.98, Mid FFR = 0.95, Distal FFR = 0.92.   4. RCA: No significant stenosis. Proximal FFR = 0.99, Mid FFR = 0.96, Distal FFR = 0.95.   IMPRESSION: 1. Coronary CTA FFR flow analysis  demonstrates no significant hemodynamically flow limiting lesions.   Fransico Him, MD     Electronically Signed   By: Fransico Him M.D.   On: 03/16/2021 22:00    EKG:  EKG is not ordered today.    Recent Labs: 01/15/2022: ALT 15 02/16/2022: Hemoglobin 14.1; Platelets 244 02/19/2022: BUN 17; Creatinine, Ser 0.64; Magnesium 2.1; Potassium 4.1; Sodium 140; TSH 1.760  Recent Lipid Panel    Component Value Date/Time   CHOL 184 12/01/2021 1620   TRIG 44 12/01/2021 1620   HDL 66 12/01/2021 1620   CHOLHDL 2.8 12/01/2021 1620   CHOLHDL 3.6 07/02/2015 1147   VLDL 18 07/02/2015 1147   LDLCALC 109 (H) 12/01/2021 1620    Home Medications   No outpatient medications have been marked as taking for the 03/01/22 encounter (Appointment) with Elgie Collard, PA-C.     Review of Systems      All other systems reviewed and are otherwise negative except as noted above.  Physical Exam    VS:  LMP 09/06/2013  ,  BMI There is no height or weight on file to calculate BMI.  Wt Readings from Last 3 Encounters:  02/26/22 155 lb (70.3 kg)  02/19/22 155 lb (70.3 kg)  02/17/22 154 lb (69.9 kg)     GEN: Well nourished, well developed, in no acute distress. HEENT: normal. Neck: Supple, no JVD, carotid bruits, or masses. Cardiac: RRR, no murmurs, rubs, or gallops. No clubbing, cyanosis, edema.  Radials/PT 2+ and equal bilaterally.  Respiratory:  Respirations regular and unlabored, clear to auscultation bilaterally. GI: Soft, nontender, nondistended. MS: No deformity or atrophy. Skin: Warm and dry, no rash. Neuro:  Strength and sensation are intact. Psych: Normal affect.  Assessment & Plan    Atypical chest pain with no limiting flow based on CTA with FFR (03/16/21) -chest pressure now with left arm pain -coronary CTA low risk, continue current medications -PRN nitro ordered -asa 81 mg daily -added metoprolol succinate 12.5 mg  Palpitations -sometimes associated with chest pain -await zio monitor results -metoprolol succinate 12.51m daily added -limit caffeine and increase hydration -labs today include TSH, BMP, and mag  Hypertension -continue to monitor at home -well controlled today -added metoprolol today  HLD -LDL 109 -continue lipitor 418mdaily    Disposition: Follow up 1 month with PeJenkins RougeMD or APP.  Signed, TeElgie CollardPA-C 02/28/2022, 4:28 PM [facility]

## 2022-03-01 ENCOUNTER — Ambulatory Visit: Payer: Medicaid Other | Attending: Physician Assistant | Admitting: Physician Assistant

## 2022-03-01 VITALS — BP 128/76 | HR 81 | Ht 65.0 in | Wt 154.6 lb

## 2022-03-01 DIAGNOSIS — R102 Pelvic and perineal pain: Secondary | ICD-10-CM | POA: Diagnosis not present

## 2022-03-01 DIAGNOSIS — I10 Essential (primary) hypertension: Secondary | ICD-10-CM | POA: Diagnosis not present

## 2022-03-01 DIAGNOSIS — R072 Precordial pain: Secondary | ICD-10-CM

## 2022-03-01 DIAGNOSIS — Z5181 Encounter for therapeutic drug level monitoring: Secondary | ICD-10-CM | POA: Diagnosis not present

## 2022-03-01 DIAGNOSIS — E785 Hyperlipidemia, unspecified: Secondary | ICD-10-CM

## 2022-03-01 DIAGNOSIS — R0789 Other chest pain: Secondary | ICD-10-CM

## 2022-03-01 DIAGNOSIS — N3010 Interstitial cystitis (chronic) without hematuria: Secondary | ICD-10-CM | POA: Diagnosis not present

## 2022-03-01 DIAGNOSIS — G894 Chronic pain syndrome: Secondary | ICD-10-CM | POA: Diagnosis not present

## 2022-03-01 DIAGNOSIS — Z79899 Other long term (current) drug therapy: Secondary | ICD-10-CM | POA: Diagnosis not present

## 2022-03-01 DIAGNOSIS — R002 Palpitations: Secondary | ICD-10-CM

## 2022-03-01 NOTE — Patient Instructions (Signed)
Medication Instructions:  Start metoprolol succinate (Toprol XL) 12.5 mg, 1/2 of a 25 mg tablet daily as prescribed. *If you need a refill on your cardiac medications before your next appointment, please call your pharmacy*   Lab Work: None ordered If you have labs (blood work) drawn today and your tests are completely normal, you will receive your results only by: [address] (if you have MyChart) OR A paper copy in the mail If you have any lab test that is abnormal or we need to change your treatment, we will call you to review the results.  Follow-Up: At [facility], you and your health needs are our priority.  As part of our continuing mission to provide you with exceptional heart care, we have created designated Provider Care Teams.  These Care Teams include your primary Cardiologist (physician) and Advanced Practice Providers (APPs -  Physician Assistants and Nurse Practitioners) who all work together to provide you with the care you need, when you need it.   Your next appointment:   03/26/2022 at 3:15 PM as scheduled  Provider:   Jenkins Rouge, MD

## 2022-03-04 ENCOUNTER — Encounter: Payer: Self-pay | Admitting: Gastroenterology

## 2022-03-04 DIAGNOSIS — L820 Inflamed seborrheic keratosis: Secondary | ICD-10-CM | POA: Diagnosis not present

## 2022-03-05 ENCOUNTER — Telehealth: Payer: Self-pay | Admitting: Cardiovascular Disease

## 2022-03-05 NOTE — Telephone Encounter (Signed)
Patient would like a call back to discuss test results.

## 2022-03-08 NOTE — Telephone Encounter (Signed)
See result note.  

## 2022-03-09 NOTE — Telephone Encounter (Signed)
Pt advised her monitor results and had an episode of "fast heartrate" this past Sunday that only lasting a few seconds..but then started the Toprol Sunday afternoon and so far has been doing well... she will keep her appt with Dr Johnsie Cancel 03/26/22 and will call if anything changes prior to her visit.

## 2022-03-09 NOTE — Telephone Encounter (Signed)
Patient states she was returning call. Please advise  

## 2022-03-19 NOTE — Progress Notes (Signed)
CARDIOLOGY CONSULT NOTE       Patient ID: Denise Aguirre MRN: [id number] DOB/AGE: 62-06-1960 62 y.o.  Admit date: (Not on file) Referring Physician: Chambliss Primary Physician: Lind Covert, MD Primary Cardiologist: New Reason for Consultation: Chest pain    HPI:  62 y.o. referred by Dr Brooke Dare 03/04/21 for chest pain History of Pre diabetes A1c 5.7 and HLD on lipitor She has seen Dr Fuller Plan [facility] for worsening reflux and globus sensation despite Rx with pantoprazole EGD 10/10/20 with some duodenal erosions and gastric polyps  Patient stated that she wants to be seen due to having chest pain. Patient referred herself. Patient stated that her husband had no history of CAD and his test had been fine, and he ended up getting a CABG x 3. Patient stated when she felt the chest pain, that it made her think of her husband and she would like to get a cardiac CT scan. Patient stated her chest pain feels like a pressure on her left side that comes and goes during rest and activity that has been going on for a couple of months. Patient stated sometimes her heart is racing at the time, but not always. Patient denies any SOB or any other symptoms.  She has a daughter in medical school Denise Aguirre Son just finished his anesthesia residency in [place] in [place] does medical sales  Her husband ended up having His CABG in [facility]   TTE 02/24/22 EF 55-60% normal  Cardiac CTA done 03/16/21 calcium score only 3.36 64 th percentile CAD RADS one No obstructive CAD  Started on lopressor by PA for her palpitations  Monitor 03/05/22 no significant arrhythmias PAC/PVC;s < 1% average HR 79 bpm  She inquired about weaning off beta blocker which is fine Daughter is starting residency in [place] As well next year She and mom going to see family in [place] in February     ROS All other systems reviewed and negative except as noted above  Past Medical History:  Diagnosis Date    Allergy    seasonal allergies   Arthritis    bilateral hands   Cystitis, interstitial    chronic since 1999   Diverticulitis    GERD (gastroesophageal reflux disease)    Osteopenia    Osteoporosis    Other malaise and fatigue    Screening cholesterol level     Family History  Problem Relation Age of Onset   Stroke Father    Skin cancer Maternal Grandmother    Colon cancer Neg Hx    Esophageal cancer Neg Hx    Pancreatic cancer Neg Hx    Stomach cancer Neg Hx    Liver disease Neg Hx    Colon polyps Neg Hx    Rectal cancer Neg Hx     Social History   Socioeconomic History   Marital status: Married    Spouse name: Not on file   Number of children: Not on file   Years of education: Not on file   Highest education level: Not on file  Occupational History   Not on file  Tobacco Use   Smoking status: Former    Types: Cigarettes    Quit date: 11/22/1985    Years since quitting: 36.3   Smokeless tobacco: Never  Vaping Use   Vaping Use: Never used  Substance and Sexual Activity   Alcohol use: No   Drug use: No   Sexual activity: Not on file  Other Topics Concern  Not on file  Social History Narrative   Not on file   Social Determinants of Health   Financial Resource Strain: Not on file  Food Insecurity: Not on file  Transportation Needs: Not on file  Physical Activity: Not on file  Stress: Not on file  Social Connections: Not on file  Intimate Partner Violence: Not on file    Past Surgical History:  Procedure Laterality Date   COLONOSCOPY  2017   MS-MAC-adeq with suctioning and ext rinsing-TA x 1   dental biopsy     right buccal mucosa    INTERSTIM IMPLANT PLACEMENT     and removal in 2008   [place] Right    with plate      Current Outpatient Medications:    atorvastatin (LIPITOR) 40 MG tablet, TAKE 1 TABLET(40 MG) BY MOUTH  DAILY, Disp: 90 tablet, Rfl: 3   Calcium Carb-Cholecalciferol 600-200 MG-UNIT TABS, Take 2 tablets by mouth daily., Disp: , Rfl:    co-enzyme Q-10 50 MG capsule, Take 50 mg by mouth daily., Disp: , Rfl:    Docusate Calcium (STOOL SOFTENER PO), Take 2 capsules by mouth at bedtime., Disp: , Rfl:    methadone (DOLOPHINE) 5 MG tablet, Take 5 mg by mouth 2 (two) times daily., Disp: , Rfl:    metoprolol succinate (TOPROL XL) 25 MG 24 hr tablet, Take 0.5 tablets (12.5 mg total) by mouth daily., Disp: 45 tablet, Rfl: 3   naloxone (NARCAN) nasal spray 4 mg/0.1 mL, as needed., Disp: , Rfl:    nitroGLYCERIN (NITROSTAT) 0.4 MG SL tablet, Place 1 tablet (0.4 mg total) under the tongue every 5 (five) minutes as needed for chest pain (max 3 doses, if no relief call 911)., Disp: 25 tablet, Rfl: 3   Omega-3 Fatty Acids (FISH OIL PO), Take 2 capsules by mouth daily. , Disp: , Rfl:    pantoprazole (PROTONIX) 40 MG tablet, Take 1 tablet (40 mg total) by mouth 2 (two) times daily., Disp: 180 tablet, Rfl: 0   polyethylene glycol powder (GLYCOLAX/MIRALAX) 17 GM/SCOOP powder, Take 17 g by mouth daily. (Patient taking differently: Take 17 g by mouth as needed.), Disp: 500 g, Rfl: 0   aspirin EC 81 MG tablet, Take 1 tablet (81 mg total) by mouth daily. Swallow whole. (Patient not taking: Reported on 03/26/2022), Disp: 30 tablet, Rfl: 3   metoprolol tartrate (LOPRESSOR) 100 MG tablet, Take one tablet by mouth 2 hours prior to CT (Patient not taking: Reported on 03/26/2022), Disp: 1 tablet, Rfl: 0     Physical Exam: Blood pressure (!) 104/54, pulse 67, height 5' (1.524 m), weight 157 lb 12.8 oz (71.6 kg), last menstrual period 09/06/2013, SpO2 96 %.    Affect appropriate Healthy:  appears stated age 23: normal Neck supple with no adenopathy JVP normal no bruits no thyromegaly Lungs clear with no wheezing and good diaphragmatic motion Heart:  S1/S2 no murmur, no rub, gallop or click PMI normal Abdomen: benighn, BS  positve, no tenderness, no AAA no bruit.  No HSM or HJR Distal pulses intact with no bruits No edema Neuro non-focal Skin warm and dry No muscular weakness   Labs:   Lab Results  Component Value Date   WBC 7.7 02/16/2022   HGB 14.1 02/16/2022   HCT 41.8 02/16/2022   MCV  87.3 02/16/2022   PLT 244 02/16/2022   No results for input(s): "NA", "K", "CL", "CO2", "BUN", "CREATININE", "CALCIUM", "PROT", "BILITOT", "ALKPHOS", "ALT", "AST", "GLUCOSE" in the last 168 hours.  Invalid input(s): "LABALBU" Lab Results  Component Value Date   [id number] 11/28/2018   TROPONINI <0.01        NO INDICATION OF MYOCARDIAL INJURY. 08/15/2007    Lab Results  Component Value Date   CHOL 184 12/01/2021   CHOL 152 10/31/2020   CHOL 147 10/30/2019   Lab Results  Component Value Date   HDL 66 12/01/2021   HDL 67 10/31/2020   HDL 60 10/30/2019   Lab Results  Component Value Date   LDLCALC 109 (H) 12/01/2021   LDLCALC 76 10/31/2020   LDLCALC 77 10/30/2019   Lab Results  Component Value Date   TRIG 44 12/01/2021   TRIG 40 10/31/2020   TRIG 43 10/30/2019   Lab Results  Component Value Date   CHOLHDL 2.8 12/01/2021   CHOLHDL 2.3 10/31/2020   CHOLHDL 2.5 10/30/2019   No results found for: "LDLDIRECT"    Radiology: LONG TERM MONITOR (3-14 DAYS)  Result Date: 03/05/2022 Patch Wear Time:  7 days and 7 hours (2024-01-05T10:32:01-499 to 2024-01-12T17:32:25-0500) Patient had a min HR of 52 bpm, max HR of 162 bpm, and avg HR of 79 bpm. Predominant underlying rhythm was Sinus Rhythm. 7 Supraventricular Tachycardia runs occurred, the run with the fastest interval lasting 4 beats with a max rate of 162 bpm, the longest lasting 10 beats with an avg rate of 150 bpm. Isolated SVEs were rare (<1.0%), SVE Couplets were rare (<1.0%), and no SVE Triplets were present. Isolated VEs were rare (<1.0%), and no VE Couplets or VE Triplets were present. Jenkins Rouge MD [facility]   CT CORONARY [facility] W/CTA COR  W/SCORE Lewanda Rife W/CM &/OR WO/CM  Addendum Date: 02/26/2022   ADDENDUM REPORT: 02/26/2022 10:51 EXAM: OVER-READ INTERPRETATION  CT CHEST The following report is a limited chest CT over-read performed by radiologist Dr. Abigail Miyamoto of [facility], [place] on 02/25/2022. This over-read does not include interpretation of cardiac or coronary anatomy or pathology. The coronary CTA interpretation by the cardiologist is attached. COMPARISON:  03/16/2021 FINDINGS: No pleural fluid.  Clear imaged lungs. Normal aortic caliber. No central pulmonary embolism, on this non-dedicated study. No imaged thoracic adenopathy. Normal imaged portions of the liver, spleen, stomach. No acute osseous abnormality. IMPRESSION: No acute findings in the imaged extracardiac chest. Electronically Signed   By: Abigail Miyamoto M.D.   On: 02/26/2022 10:51   Result Date: 02/26/2022 CLINICAL DATA:  38F with pre diabetes, hyperlipidemia, and chest pain. EXAM: Cardiac/Coronary  CT TECHNIQUE: The patient was scanned on a [employer]. FINDINGS: A 120 kV prospective scan was triggered in the descending thoracic aorta at 111 HU's. Axial non-contrast 3 mm slices were carried out through the heart. The data set was analyzed on a dedicated work station and scored using the [place]. Gantry rotation speed was 250 msecs and collimation was .6 mm. No beta blockade and 0.8 mg of sl NTG was given. The 3D data set was reconstructed in 5% intervals of the 67-82 % of the R-R cycle. Diastolic phases were analyzed on a dedicated work station using MPR, MIP and VRT modes. The patient received 80 cc of contrast. Aorta: Normal size.  No calcifications.  No dissection. Aortic Valve:  Trileaflet.  No calcifications. Coronary Arteries:  Normal coronary origin.  Right dominance. RCA is a large  dominant artery that gives rise to PDA and two PLV branches. There is no plaque. Left main is a large artery that gives rise to LAD, RI, and LCX arteries. LAD is a large  vessel that has no plaque. There is a large D1 without plaque. RI has no plaque. LCX is a non-dominant artery with minimal (< 25%) calcified plaque. Coronary Calcium Score: Left main: 0 Left anterior descending artery: 0 Left circumflex artery: 3.36 Right coronary artery: 0 Total: 3.36 Percentile: 64th Other findings: Normal pulmonary vein drainage into the left atrium. Normal let atrial appendage without a thrombus. Normal size of the pulmonary artery. IMPRESSION: 1. Coronary calcium score of 3.36. This was 64th percentile for age-, race-, and sex-matched controls. 2. Normal coronary origin with right dominance. 3. Minimal (< 25%) calcified plaque in the left circumflex. CAD-RADS 1. 4.  Consider non-cardiac causes of chest pain. Interpretation of the non-cardiac thoracic structures by radiology is pending. Skeet Latch, MD Electronically Signed: By: Skeet Latch M.D. On: 02/26/2022 07:45    EKG: SR rate 86 normal    ASSESSMENT AND PLAN:   Chest Pain: non cardiac CTA 02/26/22 no obstructive dx low calcium score  and negative FFR  HLD:  continue statin LDL 77 Pre DM:  low carb diet A1c 5.7  GERD:  post EGD benign on protonix f/u Stark 5.   Palpitations benign on beta blocker long term monitor 03/05/22 benign TSH and labs normal   F/U in a year   Signed: Jenkins Rouge 03/26/2022, 3:21 PM

## 2022-03-26 ENCOUNTER — Encounter: Payer: Self-pay | Admitting: Cardiovascular Disease

## 2022-03-26 ENCOUNTER — Ambulatory Visit: Payer: Medicaid Other | Attending: Cardiovascular Disease | Admitting: Cardiovascular Disease

## 2022-03-26 VITALS — BP 104/54 | HR 67 | Ht 60.0 in | Wt 157.8 lb

## 2022-03-26 DIAGNOSIS — R002 Palpitations: Secondary | ICD-10-CM

## 2022-03-26 DIAGNOSIS — E785 Hyperlipidemia, unspecified: Secondary | ICD-10-CM | POA: Diagnosis not present

## 2022-03-26 DIAGNOSIS — R0789 Other chest pain: Secondary | ICD-10-CM

## 2022-03-26 NOTE — Patient Instructions (Signed)
Medication Instructions:  Your physician recommends that you continue on your current medications as directed. Please refer to the Current Medication list given to you today.  *If you need a refill on your cardiac medications before your next appointment, please call your pharmacy*  Lab Work: If you have labs (blood work) drawn today and your tests are completely normal, you will receive your results only by: [place] (if you have MyChart) OR A paper copy in the mail If you have any lab test that is abnormal or we need to change your treatment, we will call you to review the results.  Testing/Procedures: None ordered today.  Follow-Up: At [facility], you and your health needs are our priority.  As part of our continuing mission to provide you with exceptional heart care, we have created designated Provider Care Teams.  These Care Teams include your primary Cardiologist (physician) and Advanced Practice Providers (APPs -  Physician Assistants and Nurse Practitioners) who all work together to provide you with the care you need, when you need it.  We recommend signing up for the patient portal called "MyChart".  Sign up information is provided on this After Visit Summary.  MyChart is used to connect with patients for Virtual Visits (Telemedicine).  Patients are able to view lab/test results, encounter notes, upcoming appointments, etc.  Non-urgent messages can be sent to your provider as well.   To learn more about what you can do with MyChart, go to [email or web address].    Your next appointment:   1 year(s)  Provider:   Jenkins Rouge, MD

## 2022-04-13 ENCOUNTER — Other Ambulatory Visit: Payer: Self-pay | Admitting: Gastroenterology

## 2022-04-21 DIAGNOSIS — H5213 Myopia, bilateral: Secondary | ICD-10-CM | POA: Diagnosis not present

## 2022-04-29 DIAGNOSIS — M1812 Unilateral primary osteoarthritis of first carpometacarpal joint, left hand: Secondary | ICD-10-CM | POA: Diagnosis not present

## 2022-04-29 DIAGNOSIS — M1811 Unilateral primary osteoarthritis of first carpometacarpal joint, right hand: Secondary | ICD-10-CM | POA: Diagnosis not present

## 2022-05-03 DIAGNOSIS — N3010 Interstitial cystitis (chronic) without hematuria: Secondary | ICD-10-CM | POA: Diagnosis not present

## 2022-05-03 DIAGNOSIS — R102 Pelvic and perineal pain: Secondary | ICD-10-CM | POA: Diagnosis not present

## 2022-05-03 DIAGNOSIS — G8929 Other chronic pain: Secondary | ICD-10-CM | POA: Diagnosis not present

## 2022-05-28 ENCOUNTER — Other Ambulatory Visit: Payer: Self-pay

## 2022-05-28 MED ORDER — ATORVASTATIN CALCIUM 40 MG PO TABS: ORAL_TABLET | ORAL | 3 refills | Status: DC

## 2022-06-02 ENCOUNTER — Encounter: Payer: Self-pay | Admitting: Family Medicine

## 2022-06-03 MED ORDER — CETIRIZINE HCL 10 MG PO TABS: 10.0000 mg | ORAL_TABLET | Freq: Every day | ORAL | 11 refills | Status: DC

## 2022-06-21 DIAGNOSIS — H5203 Hypermetropia, bilateral: Secondary | ICD-10-CM | POA: Diagnosis not present

## 2022-06-21 DIAGNOSIS — H52221 Regular astigmatism, right eye: Secondary | ICD-10-CM | POA: Diagnosis not present

## 2022-06-28 DIAGNOSIS — R102 Pelvic and perineal pain: Secondary | ICD-10-CM | POA: Diagnosis not present

## 2022-06-28 DIAGNOSIS — G8929 Other chronic pain: Secondary | ICD-10-CM | POA: Diagnosis not present

## 2022-06-28 DIAGNOSIS — Z79899 Other long term (current) drug therapy: Secondary | ICD-10-CM | POA: Diagnosis not present

## 2022-06-28 DIAGNOSIS — Z5181 Encounter for therapeutic drug level monitoring: Secondary | ICD-10-CM | POA: Diagnosis not present

## 2022-06-28 DIAGNOSIS — N3010 Interstitial cystitis (chronic) without hematuria: Secondary | ICD-10-CM | POA: Diagnosis not present

## 2022-07-13 ENCOUNTER — Other Ambulatory Visit: Payer: Self-pay | Admitting: Gastroenterology

## 2022-08-23 DIAGNOSIS — G8929 Other chronic pain: Secondary | ICD-10-CM | POA: Diagnosis not present

## 2022-08-23 DIAGNOSIS — R102 Pelvic and perineal pain: Secondary | ICD-10-CM | POA: Diagnosis not present

## 2022-08-23 DIAGNOSIS — G894 Chronic pain syndrome: Secondary | ICD-10-CM | POA: Diagnosis not present

## 2022-08-23 DIAGNOSIS — N3010 Interstitial cystitis (chronic) without hematuria: Secondary | ICD-10-CM | POA: Diagnosis not present

## 2022-08-31 ENCOUNTER — Ambulatory Visit (INDEPENDENT_AMBULATORY_CARE_PROVIDER_SITE_OTHER): Payer: Medicaid Other | Admitting: Family Medicine

## 2022-08-31 VITALS — BP 116/72 | HR 69 | Wt 154.0 lb

## 2022-08-31 DIAGNOSIS — N3000 Acute cystitis without hematuria: Secondary | ICD-10-CM

## 2022-08-31 LAB — POCT URINALYSIS DIP (MANUAL ENTRY)
Bilirubin, UA: NEGATIVE
Glucose, UA: NEGATIVE mg/dL
Ketones, POC UA: NEGATIVE mg/dL
Nitrite, UA: NEGATIVE
Protein Ur, POC: NEGATIVE mg/dL
Spec Grav, UA: 1.025 (ref 1.010–1.025)
Urobilinogen, UA: 0.2 E.U./dL
pH, UA: 5.5 (ref 5.0–8.0)

## 2022-08-31 MED ORDER — NITROFURANTOIN MONOHYD MACRO 100 MG PO CAPS: 100.0000 mg | ORAL_CAPSULE | Freq: Two times a day (BID) | ORAL | 0 refills | Status: AC

## 2022-08-31 NOTE — Patient Instructions (Addendum)
You may have a UTI-please take Macrobid 100 mg twice daily for 5 days  Please let our office know if your symptoms do not resolve after completing the entire course (please complete the course even if you start feeling better).  Please reach out if you have fevers, blood in your urine, severe abdominal pain, nausea, or vomiting.  Vonna Drafts, MD

## 2022-08-31 NOTE — Progress Notes (Signed)
  SUBJECTIVE:   CHIEF COMPLAINT / HPI:   Dysuria/UTI symptoms -burning, frequency, suprapubic pressure x4-5 days. Lower back pain x1d -does have hx of interstitial cystitis, so difficult to discern whether the symptoms are new -no fevers, N/V/D, -no hematuria -No concern for STDs, no vaginal discharge or itching    PERTINENT  PMH / PSH:   Past Medical History:  Diagnosis Date   Allergy    seasonal allergies   Arthritis    bilateral hands   Cystitis, interstitial    chronic since 1999   Diverticulitis    GERD (gastroesophageal reflux disease)    Osteopenia    Osteoporosis    Other malaise and fatigue    Screening cholesterol level     OBJECTIVE:  BP 116/72   Pulse 69   Wt 154 lb (69.9 kg)   LMP 09/06/2013   SpO2 98%   BMI 30.08 kg/m   General: NAD, pleasant, able to participate in exam Respiratory: No respiratory distress Abd: Soft NT/ND, no CVA tenderness Skin: warm and dry, no rashes noted Psych: Normal affect and mood  ASSESSMENT/PLAN:   1. Acute cystitis without hematuria History of interstitial cystitis, presents with dysuria, pressure, frequency x 4 to 5 days.  UA shows trace leukocytes and trace blood.  Unable to obtain culture.  Given her symptoms we will treat with Macrobid x 5 days, provided return precautions for worsening symptoms/pyelo.  If symptoms do not resolve consider obtaining repeat urine specimen for culture.  - POCT urinalysis dipstick - nitrofurantoin, macrocrystal-monohydrate, (MACROBID) 100 MG capsule; Take 1 capsule (100 mg total) by mouth 2 (two) times daily for 5 days.  Dispense: 10 capsule; Refill: 0   Meds ordered this encounter  Medications   nitrofurantoin, macrocrystal-monohydrate, (MACROBID) 100 MG capsule    Sig: Take 1 capsule (100 mg total) by mouth 2 (two) times daily for 5 days.    Dispense:  10 capsule    Refill:  0   Return if symptoms worsen or fail to improve.  Vonna Drafts, MD [facility]

## 2022-09-03 ENCOUNTER — Telehealth: Payer: Self-pay

## 2022-09-03 NOTE — Telephone Encounter (Signed)
Patient calls nurse line requesting alternative medication for UTI.   She was prescribed Macrobid on 7/16 and reports that she has not noticed any improvement in symptoms and that she is experiencing nausea from abx.   She reports that she has been taking food with medication.   Denies fever or chills.   She is continuing to feel pelvic pressure and back pain. Denies blood in urine.   Please advise if alternative can be sent in. I also informed patient that she may need to come back to office for additional urine sample for culture. Patient is agreeable to this if needed.   ED precautions discussed.   Veronda Prude, RN

## 2022-09-06 ENCOUNTER — Other Ambulatory Visit: Payer: Self-pay | Admitting: Family Medicine

## 2022-09-06 ENCOUNTER — Other Ambulatory Visit: Payer: Self-pay

## 2022-09-06 ENCOUNTER — Other Ambulatory Visit: Payer: Medicaid Other

## 2022-09-06 DIAGNOSIS — R399 Unspecified symptoms and signs involving the genitourinary system: Secondary | ICD-10-CM

## 2022-09-06 DIAGNOSIS — N3000 Acute cystitis without hematuria: Secondary | ICD-10-CM

## 2022-09-06 MED ORDER — CEPHALEXIN 500 MG PO CAPS: 500.0000 mg | ORAL_CAPSULE | Freq: Four times a day (QID) | ORAL | 0 refills | Status: AC

## 2022-09-06 NOTE — Telephone Encounter (Signed)
Received returned call from Dr. Barb Merino.   Verbal orders received for patient to provide urine culture.   Called patient and scheduled lab visit for this morning at 11 am.   Veronda Prude, RN

## 2022-09-06 NOTE — Telephone Encounter (Signed)
Patient calls nurse line multiple times regarding this concern.   Spoke with Dr. Deirdre Priest who asked that I page Dr. Barb Merino.   Provider paged at [phone]. Will await response.   Veronda Prude, RN

## 2022-09-06 NOTE — Progress Notes (Signed)
Received verbal orders from Dr. Barb Merino for urine culture.   Veronda Prude, RN

## 2022-09-06 NOTE — Telephone Encounter (Signed)
Patient returns call to nurse line regarding previous message.   Please advise.   Talbot Grumbling, RN

## 2022-09-08 LAB — URINE CULTURE

## 2022-10-11 ENCOUNTER — Other Ambulatory Visit: Payer: Self-pay | Admitting: Gastroenterology

## 2022-10-27 DIAGNOSIS — Z79899 Other long term (current) drug therapy: Secondary | ICD-10-CM | POA: Diagnosis not present

## 2022-10-27 DIAGNOSIS — G894 Chronic pain syndrome: Secondary | ICD-10-CM | POA: Diagnosis not present

## 2022-10-27 DIAGNOSIS — Z5181 Encounter for therapeutic drug level monitoring: Secondary | ICD-10-CM | POA: Diagnosis not present

## 2022-10-29 DIAGNOSIS — M79671 Pain in right foot: Secondary | ICD-10-CM | POA: Diagnosis not present

## 2022-10-29 DIAGNOSIS — M2011 Hallux valgus (acquired), right foot: Secondary | ICD-10-CM | POA: Diagnosis not present

## 2022-11-03 ENCOUNTER — Other Ambulatory Visit: Payer: Self-pay | Admitting: Family Medicine

## 2022-11-03 DIAGNOSIS — Z1231 Encounter for screening mammogram for malignant neoplasm of breast: Secondary | ICD-10-CM

## 2022-12-20 ENCOUNTER — Ambulatory Visit: Payer: Medicaid Other

## 2022-12-20 ENCOUNTER — Ambulatory Visit
Admission: RE | Admit: 2022-12-20 | Discharge: 2022-12-20 | Disposition: A | Discharge status: Home / Self Care | Payer: Medicaid Other | Source: Ambulatory Visit | Attending: Family Medicine | Admitting: Family Medicine

## 2022-12-20 DIAGNOSIS — Z1231 Encounter for screening mammogram for malignant neoplasm of breast: Secondary | ICD-10-CM | POA: Diagnosis not present

## 2022-12-24 ENCOUNTER — Ambulatory Visit (INDEPENDENT_AMBULATORY_CARE_PROVIDER_SITE_OTHER): Payer: Medicaid Other

## 2022-12-24 DIAGNOSIS — Z23 Encounter for immunization: Secondary | ICD-10-CM

## 2022-12-27 NOTE — Progress Notes (Signed)
Patient presents to nurse clinic for flu vaccination. Administered in LD, site unremarkable, tolerated injection well.   Cordarrel Stiefel C Arek Spadafore, RN   

## 2022-12-31 ENCOUNTER — Other Ambulatory Visit: Payer: Self-pay

## 2023-01-03 DIAGNOSIS — G894 Chronic pain syndrome: Secondary | ICD-10-CM | POA: Diagnosis not present

## 2023-01-03 DIAGNOSIS — R102 Pelvic and perineal pain: Secondary | ICD-10-CM | POA: Diagnosis not present

## 2023-01-03 DIAGNOSIS — N3010 Interstitial cystitis (chronic) without hematuria: Secondary | ICD-10-CM | POA: Diagnosis not present

## 2023-01-11 ENCOUNTER — Encounter: Payer: Self-pay | Admitting: Family Medicine

## 2023-01-11 MED ORDER — HYDROXYZINE HCL 25 MG PO TABS: 25.0000 mg | ORAL_TABLET | Freq: Every evening | ORAL | 1 refills | Status: DC | PRN

## 2023-02-02 ENCOUNTER — Other Ambulatory Visit: Payer: Self-pay | Admitting: Medical Genetics

## 2023-02-17 ENCOUNTER — Encounter: Payer: Self-pay | Admitting: Family Medicine

## 2023-03-01 DIAGNOSIS — G8918 Other acute postprocedural pain: Secondary | ICD-10-CM | POA: Diagnosis not present

## 2023-03-01 DIAGNOSIS — M21611 Bunion of right foot: Secondary | ICD-10-CM | POA: Diagnosis not present

## 2023-03-01 HISTORY — PX: FOOT SURGERY: SHX648

## 2023-03-18 ENCOUNTER — Other Ambulatory Visit: Payer: Self-pay | Admitting: Family Medicine

## 2023-03-21 ENCOUNTER — Encounter: Payer: Self-pay | Admitting: Gastroenterology

## 2023-04-14 ENCOUNTER — Other Ambulatory Visit (HOSPITAL_COMMUNITY): Payer: Self-pay | Attending: Medical Genetics

## 2023-04-20 DIAGNOSIS — Z4789 Encounter for other orthopedic aftercare: Secondary | ICD-10-CM | POA: Diagnosis not present

## 2023-04-20 DIAGNOSIS — M2011 Hallux valgus (acquired), right foot: Secondary | ICD-10-CM | POA: Diagnosis not present

## 2023-04-26 DIAGNOSIS — G894 Chronic pain syndrome: Secondary | ICD-10-CM | POA: Diagnosis not present

## 2023-04-26 DIAGNOSIS — Z79899 Other long term (current) drug therapy: Secondary | ICD-10-CM | POA: Diagnosis not present

## 2023-04-26 DIAGNOSIS — R102 Pelvic and perineal pain: Secondary | ICD-10-CM | POA: Diagnosis not present

## 2023-04-26 DIAGNOSIS — Z5181 Encounter for therapeutic drug level monitoring: Secondary | ICD-10-CM | POA: Diagnosis not present

## 2023-04-26 DIAGNOSIS — N3010 Interstitial cystitis (chronic) without hematuria: Secondary | ICD-10-CM | POA: Diagnosis not present

## 2023-05-02 ENCOUNTER — Ambulatory Visit (AMBULATORY_SURGERY_CENTER): Payer: Medicaid Other

## 2023-05-02 VITALS — Ht 65.0 in | Wt 155.0 lb

## 2023-05-02 DIAGNOSIS — R269 Unspecified abnormalities of gait and mobility: Secondary | ICD-10-CM | POA: Diagnosis not present

## 2023-05-02 DIAGNOSIS — Z860100 Personal history of colon polyps, unspecified: Secondary | ICD-10-CM

## 2023-05-02 DIAGNOSIS — M79671 Pain in right foot: Secondary | ICD-10-CM | POA: Diagnosis not present

## 2023-05-02 MED ORDER — SUFLAVE 178.7 G PO SOLR: 1.0000 | Freq: Once | ORAL | 0 refills | Status: AC

## 2023-05-02 MED ORDER — BISACODYL EC 5 MG PO TBEC: 5.0000 mg | DELAYED_RELEASE_TABLET | Freq: Every day | ORAL | 0 refills | Status: DC | PRN

## 2023-05-02 MED ORDER — POLYETHYLENE GLYCOL 3350 17 GM/SCOOP PO POWD: 119.0000 g | Freq: Every day | ORAL | 3 refills | Status: AC

## 2023-05-02 NOTE — Progress Notes (Signed)
 No egg or soy allergy known to patient  No issues known to pt with past sedation with any surgeries or procedures Patient denies ever being told they had issues or difficulty with intubation  No FH of Malignant Hyperthermia Pt is not on diet pills Pt is not on  home 02  Pt is not on blood thinners  Pt has issues with constipation and takes stool softner No A fib or A flutter Have any cardiac testing pending--no Pt can ambulate independently Pt denies use of chewing tobacco Discussed diabetic I weight loss medication holds Discussed NSAID holds Checked BMI Pt instructed to use Single[employer]com or GoodRx for a price reduction on prep  Patient's chart reviewed by Cathlyn Parsons CNRA prior to previsit and patient appropriate for the [facility].  Pre visit completed and red dot placed by patient's name on their procedure day (on provider's schedule).

## 2023-05-04 ENCOUNTER — Other Ambulatory Visit: Payer: Self-pay

## 2023-05-04 ENCOUNTER — Telehealth: Payer: Self-pay | Admitting: Gastroenterology

## 2023-05-04 DIAGNOSIS — Z860100 Personal history of colon polyps, unspecified: Secondary | ICD-10-CM

## 2023-05-04 MED ORDER — SUFLAVE 178.7 G PO SOLR: 1.0000 | ORAL | 0 refills | Status: DC

## 2023-05-04 NOTE — Telephone Encounter (Signed)
 Inbound call from patient requesting prep be sent into her pharmacy at [facility] in [place].   Patient requesting not to use  [facility]   Please advise.

## 2023-05-04 NOTE — Telephone Encounter (Signed)
Rx sent to requested pharmacy. Pt made aware.

## 2023-05-10 DIAGNOSIS — M79671 Pain in right foot: Secondary | ICD-10-CM | POA: Diagnosis not present

## 2023-05-10 DIAGNOSIS — R269 Unspecified abnormalities of gait and mobility: Secondary | ICD-10-CM | POA: Diagnosis not present

## 2023-05-16 DIAGNOSIS — R269 Unspecified abnormalities of gait and mobility: Secondary | ICD-10-CM | POA: Diagnosis not present

## 2023-05-16 DIAGNOSIS — M79671 Pain in right foot: Secondary | ICD-10-CM | POA: Diagnosis not present

## 2023-05-24 ENCOUNTER — Other Ambulatory Visit: Payer: Self-pay

## 2023-05-24 ENCOUNTER — Encounter: Payer: Self-pay | Admitting: Gastroenterology

## 2023-05-24 ENCOUNTER — Ambulatory Visit: Payer: Medicaid Other | Admitting: Gastroenterology

## 2023-05-24 VITALS — BP 120/74 | HR 68 | Temp 97.4°F | Resp 11 | Ht 65.0 in | Wt 155.0 lb

## 2023-05-24 DIAGNOSIS — Z860101 Personal history of adenomatous and serrated colon polyps: Secondary | ICD-10-CM | POA: Diagnosis not present

## 2023-05-24 DIAGNOSIS — D125 Benign neoplasm of sigmoid colon: Secondary | ICD-10-CM | POA: Diagnosis not present

## 2023-05-24 DIAGNOSIS — E785 Hyperlipidemia, unspecified: Secondary | ICD-10-CM | POA: Diagnosis not present

## 2023-05-24 DIAGNOSIS — Z1211 Encounter for screening for malignant neoplasm of colon: Secondary | ICD-10-CM

## 2023-05-24 DIAGNOSIS — Q439 Congenital malformation of intestine, unspecified: Secondary | ICD-10-CM

## 2023-05-24 DIAGNOSIS — Z860100 Personal history of colon polyps, unspecified: Secondary | ICD-10-CM

## 2023-05-24 DIAGNOSIS — D124 Benign neoplasm of descending colon: Secondary | ICD-10-CM | POA: Diagnosis not present

## 2023-05-24 DIAGNOSIS — D123 Benign neoplasm of transverse colon: Secondary | ICD-10-CM

## 2023-05-24 DIAGNOSIS — K635 Polyp of colon: Secondary | ICD-10-CM | POA: Diagnosis not present

## 2023-05-24 MED ORDER — SODIUM CHLORIDE 0.9 % IV SOLN: 500.0000 mL | Freq: Once | INTRAVENOUS | Status: DC

## 2023-05-24 MED ORDER — HYDROXYZINE HCL 25 MG PO TABS: 25.0000 mg | ORAL_TABLET | Freq: Three times a day (TID) | ORAL | 1 refills | Status: DC | PRN

## 2023-05-24 NOTE — Patient Instructions (Signed)

## 2023-05-24 NOTE — Op Note (Signed)
 [facility] Patient Name: Denise Aguirre Procedure Date: 05/24/2023 9:09 AM MRN: [id number] Endoscopist: Viviann Spare P. Adela Lank , MD, [id number] Age: 63 Referring MD:  Date of Birth: 1960/08/19 Gender: Female Account #: [id number] Procedure:                Colonoscopy Indications:              High risk colon cancer surveillance: Personal                            history of colonic polyps - advanced sessile                            serrated polyp and adenoma removed 05/2020 Medicines:                Monitored Anesthesia Care Procedure:                Pre-Anesthesia Assessment:                           - Prior to the procedure, a History and Physical                            was performed, and patient medications and                            allergies were reviewed. The patient's tolerance of                            previous anesthesia was also reviewed. The risks                            and benefits of the procedure and the sedation                            options and risks were discussed with the patient.                            All questions were answered, and informed consent                            was obtained. Prior Anticoagulants: The patient has                            taken no anticoagulant or antiplatelet agents. ASA                            Grade Assessment: II - A patient with mild systemic                            disease. After reviewing the risks and benefits,                            the patient was deemed in satisfactory condition to  undergo the procedure.                           After obtaining informed consent, the colonoscope                            was passed under direct vision. Throughout the                            procedure, the patient's blood pressure, pulse, and                            oxygen saturations were monitored continuously. The                            [id number]-[id number]  Colonoscope [id number] was introduced                            through the anus and advanced to the the cecum,                            identified by appendiceal orifice and ileocecal                            valve. The colonoscopy was performed without                            difficulty. The patient tolerated the procedure                            well. The quality of the bowel preparation was                            adequate. The ileocecal valve, appendiceal orifice,                            and rectum were photographed. Scope In: 9:25:42 AM Scope Out: 9:47:44 AM Scope Withdrawal Time: 0 hours 17 minutes 9 seconds  Total Procedure Duration: 0 hours 22 minutes 2 seconds  Findings:                 The perianal and digital rectal examinations were                            normal.                           A 3 mm polyp was found in the hepatic flexure. The                            polyp was sessile. The polyp was removed with a                            cold snare. Resection and retrieval were complete.  A 5 mm polyp was found in the transverse colon. The                            polyp was sessile. The polyp was removed with a                            cold snare. Resection and retrieval were complete.                           A 4 mm polyp was found in the descending colon. The                            polyp was flat. The polyp was removed with a cold                            snare. Resection and retrieval were complete.                           A 3 mm polyp was found in the sigmoid colon. The                            polyp was sessile. The polyp was removed with a                            cold snare. Resection and retrieval were complete.                           The colon was tortuous.                           The exam was otherwise without abnormality. Complications:            No immediate complications. Estimated blood loss:                             Minimal. Estimated Blood Loss:     Estimated blood loss was minimal. Impression:               - One 3 mm polyp at the hepatic flexure, removed                            with a cold snare. Resected and retrieved.                           - One 5 mm polyp in the transverse colon, removed                            with a cold snare. Resected and retrieved.                           - One 4 mm polyp in the descending colon, removed  with a cold snare. Resected and retrieved.                           - One 3 mm polyp in the sigmoid colon, removed with                            a cold snare. Resected and retrieved.                           - Tortuous colon.                           - The examination was otherwise normal. Recommendation:           - Patient has a contact number available for                            emergencies. The signs and symptoms of potential                            delayed complications were discussed with the                            patient. Return to normal activities tomorrow.                            Written discharge instructions were provided to the                            patient.                           - Resume previous diet.                           - Continue present medications.                           - Await pathology results. Viviann Spare P. Nolin Grell, MD 05/24/2023 9:54:40 AM This report has been signed electronically.

## 2023-05-24 NOTE — Progress Notes (Signed)
 Called to room to assist during endoscopic procedure.  Patient ID and intended procedure confirmed with present staff. Received instructions for my participation in the procedure from the performing physician.

## 2023-05-24 NOTE — Progress Notes (Signed)
 [facility] History and Physical   Primary Care Physician:  Westley Chandler, MD   Reason for Procedure:   History of colon polyps  Plan:    colonoscopy     HPI: Denise Aguirre is a 63 y.o. female  here for colonoscopy surveillance - history of advanced polyps - SSP and TA removed 05/2020 Dr. Russella Dar.   Patient denies any bowel symptoms at this time. No family history of colon cancer known. Otherwise feels well without any cardiopulmonary symptoms.   I have discussed risks / benefits of anesthesia and endoscopic procedure with Denise Aguirre and they wish to proceed with the exams as outlined today.    Past Medical History:  Diagnosis Date   Allergy    seasonal allergies   Arthritis    bilateral hands   Cystitis, interstitial    chronic since 1999   Diverticulitis    GERD (gastroesophageal reflux disease)    Hyperlipidemia    Osteopenia    Osteoporosis    Other malaise and fatigue    Screening cholesterol level     Past Surgical History:  Procedure Laterality Date   COLONOSCOPY  2017   MS-MAC-adeq with suctioning and ext rinsing-TA x 1   dental biopsy     right buccal mucosa    FOOT SURGERY Right 03/01/2023   INTERSTIM IMPLANT PLACEMENT     and removal in 2008   TONSILLECTOMY     1980   TUBAL LIGATION     1995   VAGINAL HYSTERECTOMY     WISDOM TOOTH EXTRACTION     WRIST FRACTURE SURGERY Right    with plate    Prior to Admission medications   Medication Sig Start Date End Date Taking? Authorizing Provider  atorvastatin (LIPITOR) 40 MG tablet TAKE 1 TABLET(40 MG) BY MOUTH DAILY 05/28/22  Yes Chambliss, Estill Batten, MD  Calcium Carb-Cholecalciferol 600-200 MG-UNIT TABS Take 2 tablets by mouth daily.   Yes [provider]  Cholecalciferol (D3 ADULT PO) Take by mouth. 02/05/14  Yes [provider]  co-enzyme Q-10 50 MG capsule Take 50 mg by mouth daily.   Yes [provider]  Docusate Calcium (STOOL  SOFTENER PO) Take 2 capsules by mouth at bedtime.   Yes [provider]  hydrOXYzine (ATARAX) 25 MG tablet TAKE 1 TABLET(25 MG) BY MOUTH AT BEDTIME AS NEEDED 03/18/23  Yes Westley Chandler, MD  methadone (DOLOPHINE) 5 MG tablet Take by mouth. 04/26/23 05/26/23 Yes [provider]  Omega-3 Fatty Acids (FISH OIL PO) Take 2 capsules by mouth daily.    Yes [provider]  pantoprazole (PROTONIX) 40 MG tablet TAKE 1 TABLET(40 MG) BY MOUTH TWICE DAILY 10/11/22  Yes Meryl Dare, MD  polyethylene glycol powder (GLYCOLAX/MIRALAX) 17 GM/SCOOP powder Take 119 g by mouth daily. 05/02/23  Yes Milas Schappell, Willaim Rayas, MD  bisacodyl 5 MG EC tablet Take 1 tablet (5 mg total) by mouth daily as needed for moderate constipation. 05/02/23   [name], Willaim Rayas, MD    Current Outpatient Medications  Medication Sig Dispense Refill   atorvastatin (LIPITOR) 40 MG tablet TAKE 1 TABLET(40 MG) BY MOUTH DAILY 90 tablet 3   Calcium Carb-Cholecalciferol 600-200 MG-UNIT TABS Take 2 tablets by mouth daily.     Cholecalciferol (D3 ADULT PO) Take by mouth.     co-enzyme Q-10 50 MG capsule Take 50 mg by mouth daily.     Docusate Calcium (STOOL SOFTENER PO) Take 2 capsules by mouth at  bedtime.     hydrOXYzine (ATARAX) 25 MG tablet TAKE 1 TABLET(25 MG) BY MOUTH AT BEDTIME AS NEEDED 30 tablet 1   methadone (DOLOPHINE) 5 MG tablet Take by mouth.     Omega-3 Fatty Acids (FISH OIL PO) Take 2 capsules by mouth daily.      pantoprazole (PROTONIX) 40 MG tablet TAKE 1 TABLET(40 MG) BY MOUTH TWICE DAILY 180 tablet 1   polyethylene glycol powder (GLYCOLAX/MIRALAX) 17 GM/SCOOP powder Take 119 g by mouth daily. 255 g 3   bisacodyl 5 MG EC tablet Take 1 tablet (5 mg total) by mouth daily as needed for moderate constipation. 30 tablet 0   Current Facility-Administered Medications  Medication Dose Route Frequency Provider Last Rate Last Admin   0.9 %  sodium chloride infusion  500 mL Intravenous Once [name], Willaim Rayas, MD        Allergies as of 05/24/2023 - Review Complete 05/24/2023  Allergen Reaction Noted   Ivp dye [iodinated contrast media] Rash 12/21/2010   Topiramate Nausea And Vomiting 06/19/2015    Family History  Problem Relation Age of Onset   Stroke Father    Skin cancer Maternal Grandmother    Colon cancer Neg Hx    Esophageal cancer Neg Hx    Pancreatic cancer Neg Hx    Stomach cancer Neg Hx    Liver disease Neg Hx    Colon polyps Neg Hx    Rectal cancer Neg Hx     Social History   Socioeconomic History   Marital status: Married    Spouse name: Not on file   Number of children: Not on file   Years of education: Not on file   Highest education level: Not on file  Occupational History   Not on file  Tobacco Use   Smoking status: Former    Current packs/day: 0.00    Types: Cigarettes    Quit date: 11/22/1985    Years since quitting: 37.5    Passive exposure: Past   Smokeless tobacco: Never  Vaping Use   Vaping status: Never Used  Substance and Sexual Activity   Alcohol use: No   Drug use: No   Sexual activity: Not on file  Other Topics Concern   Not on file  Social History Narrative   Not on file   Social Drivers of Health   Financial Resource Strain: Not on file  Food Insecurity: Not on file  Transportation Needs: Not on file  Physical Activity: Not on file  Stress: Not on file  Social Connections: Unknown (06/26/2021)   Received from [facility]   Social Network    Social Network: Not on file  Intimate Partner Violence: Unknown (05/18/2021)   Received from [facility], [facility]   HITS    Physically Hurt: Not on file    Insult or Talk Down To: Not on file    Threaten Physical Harm: Not on file    Scream or Curse: Not on file    Review of Systems: All other review of systems negative except as mentioned in the HPI.  Physical Exam: Vital signs BP 118/73   Pulse 72   Temp (!) 97.4 F (36.3 C) (Temporal)   Ht 5\' 5"  (1.651  m)   Wt 155 lb (70.3 kg)   LMP 09/06/2013   SpO2 96%   BMI 25.79 kg/m   General:   Alert,  Well-developed, pleasant and cooperative in NAD Lungs:  Clear throughout to auscultation.  Heart:  Regular rate and rhythm Abdomen:  Soft, nontender and nondistended.   Neuro/Psych:  Alert and cooperative. Normal mood and affect. A and O x 3  Harlin Rain, MD [facility]

## 2023-05-24 NOTE — Progress Notes (Signed)
 A/o x 3, VSS, gd SR's, pleased with anesthesia, report to RN

## 2023-05-25 ENCOUNTER — Telehealth: Payer: Self-pay

## 2023-05-25 DIAGNOSIS — M2011 Hallux valgus (acquired), right foot: Secondary | ICD-10-CM | POA: Diagnosis not present

## 2023-05-25 DIAGNOSIS — Z4789 Encounter for other orthopedic aftercare: Secondary | ICD-10-CM | POA: Diagnosis not present

## 2023-05-25 NOTE — Telephone Encounter (Signed)
 Attempted to reach patient for post-procedure f/u call. Phone is busy, unable to leave message.

## 2023-05-26 ENCOUNTER — Ambulatory Visit (INDEPENDENT_AMBULATORY_CARE_PROVIDER_SITE_OTHER): Admitting: Podiatry

## 2023-05-26 ENCOUNTER — Encounter: Payer: Self-pay | Admitting: Podiatry

## 2023-05-26 DIAGNOSIS — M79671 Pain in right foot: Secondary | ICD-10-CM | POA: Diagnosis not present

## 2023-05-26 DIAGNOSIS — R269 Unspecified abnormalities of gait and mobility: Secondary | ICD-10-CM | POA: Diagnosis not present

## 2023-05-26 DIAGNOSIS — B351 Tinea unguium: Secondary | ICD-10-CM

## 2023-05-26 NOTE — Progress Notes (Signed)
  Subjective:  Patient ID: Denise Aguirre, female    DOB: October 17, 1960,  MRN: [id number]  Chief Complaint  Patient presents with   Nail Problem    Patient believes she has nail fungus and would like doctor in put    Discussed the use of AI scribe software for clinical note transcription with the patient, who gave verbal consent to proceed.  History of Present Illness Denise Aguirre is a 63 year old female who presents with nail changes on both big toes.  She describes the nails as 'coming up' with another nail growing underneath, appearing yellow. These changes have been present since before her bunion surgery in January. No trauma to the nails is reported. She uses a treadmill for exercise but does not engage in running. She is concerned about the appearance and condition of her nails and is seeking further evaluation to determine if it is a fungal infection or related to physical damage.  She was unable to get a pedicure before her bunion surgery as advised, and even months before the surgery, the nails were 'halfway' grown out. She recently resumed pedicures after being cleared. She describes the nails as 'weird' and is unsure of the cause, noting that they seem to be lifting and may shed at some point.  She has a history of interstitial cystitis but denies any liver or kidney issues.      Objective:    Physical Exam VASCULAR: DP and PT pulse palpable. Foot is warm and well-perfused. Capillary fill time is brisk. DERMATOLOGIC: Normal skin turgor, texture, and temperature. No open lesions, rashes, or ulcerations. Nail on both big toes yellowish, lifting, and shedding. NEUROLOGIC: Normal sensation to light touch and pressure. No paresthesias on examination. ORTHOPEDIC: Smooth, pain-free range of motion of all examined joints. No ecchymosis or bruising. No gross deformity. No pain to palpation.        Results     Assessment:   1. Nail fungus      Plan:   Patient was evaluated and treated and all questions answered.  Assessment and Plan Assessment & Plan Onychomycosis Presents with yellowing and lifting of the toenails on both big toes, present before bunion surgery in January. Differential diagnosis includes onychomycosis and physical damage to the nail. Nail fungus is common, caused by environmental factors or pressure on the nail. Oral antifungal medications like terbinafine (Lamisil) have an 85% success rate but require monitoring for liver function in patients with liver or kidney issues. She has no liver or kidney problems, so monitoring may not be necessary. Topical treatments like Jublia and laser treatments are alternatives but may have insurance coverage issues or high costs. Treatment duration can be 6 to 12 months due to slow nail growth. - Obtain nail samples for culture to confirm onychomycosis. - Discuss treatment options based on culture results, including oral terbinafine, topical Jublia, or laser treatment. - Monitor liver function if oral terbinafine is prescribed, especially if there are any concerns about liver or kidney health. - Advise to maintain regular nail care, including trimming and filing, but avoid cutting nails too far back. - Schedule follow-up to discuss culture results and treatment plan.        No follow-ups on file.

## 2023-05-27 LAB — SURGICAL PATHOLOGY

## 2023-05-31 DIAGNOSIS — L570 Actinic keratosis: Secondary | ICD-10-CM | POA: Diagnosis not present

## 2023-06-03 ENCOUNTER — Encounter: Payer: Self-pay | Admitting: Gastroenterology

## 2023-06-13 DIAGNOSIS — R269 Unspecified abnormalities of gait and mobility: Secondary | ICD-10-CM | POA: Diagnosis not present

## 2023-06-13 DIAGNOSIS — M79671 Pain in right foot: Secondary | ICD-10-CM | POA: Diagnosis not present

## 2023-06-14 ENCOUNTER — Other Ambulatory Visit: Payer: Self-pay | Admitting: Podiatry

## 2023-06-16 ENCOUNTER — Encounter: Payer: Self-pay | Admitting: Podiatry

## 2023-06-21 DIAGNOSIS — R102 Pelvic and perineal pain: Secondary | ICD-10-CM | POA: Diagnosis not present

## 2023-06-21 DIAGNOSIS — G894 Chronic pain syndrome: Secondary | ICD-10-CM | POA: Diagnosis not present

## 2023-07-21 DIAGNOSIS — M222X1 Patellofemoral disorders, right knee: Secondary | ICD-10-CM | POA: Diagnosis not present

## 2023-07-21 DIAGNOSIS — M25561 Pain in right knee: Secondary | ICD-10-CM | POA: Diagnosis not present

## 2023-07-21 DIAGNOSIS — M222X2 Patellofemoral disorders, left knee: Secondary | ICD-10-CM | POA: Diagnosis not present

## 2023-07-21 DIAGNOSIS — M25562 Pain in left knee: Secondary | ICD-10-CM | POA: Diagnosis not present

## 2023-08-01 ENCOUNTER — Other Ambulatory Visit: Payer: Self-pay | Admitting: *Deleted

## 2023-08-01 MED ORDER — HYDROXYZINE HCL 25 MG PO TABS: 25.0000 mg | ORAL_TABLET | Freq: Three times a day (TID) | ORAL | 1 refills | Status: AC | PRN

## 2023-08-05 ENCOUNTER — Ambulatory Visit: Admitting: Student

## 2023-08-05 ENCOUNTER — Encounter: Payer: Self-pay | Admitting: Student

## 2023-08-05 VITALS — BP 135/81 | HR 72 | Ht 65.0 in | Wt 155.4 lb

## 2023-08-05 DIAGNOSIS — R739 Hyperglycemia, unspecified: Secondary | ICD-10-CM

## 2023-08-05 DIAGNOSIS — E785 Hyperlipidemia, unspecified: Secondary | ICD-10-CM

## 2023-08-05 DIAGNOSIS — Z0000 Encounter for general adult medical examination without abnormal findings: Secondary | ICD-10-CM

## 2023-08-05 DIAGNOSIS — M8580 Other specified disorders of bone density and structure, unspecified site: Secondary | ICD-10-CM | POA: Diagnosis not present

## 2023-08-05 DIAGNOSIS — R42 Dizziness and giddiness: Secondary | ICD-10-CM | POA: Insufficient documentation

## 2023-08-05 LAB — POCT GLYCOSYLATED HEMOGLOBIN (HGB A1C): HbA1c, POC (prediabetic range): 5.9 % (ref 5.7–6.4)

## 2023-08-05 NOTE — Assessment & Plan Note (Signed)
 Orthostatic hypotension suspected due to 19-point systolic drop when going from lying to sitting and patient was mildly symptomatic.  Normal neuroexam is reassuring.  Patient had heart monitor last year which was nonconcerning for arrhythmia. - Order CBC to rule out anemia and CMP to rule out electrolyte abnormalities and ensure normal liver and kidney function - Educated on hydration, slow positional changes, circulation exercises. - Advised to return if symptoms worsen or new symptoms develop.

## 2023-08-05 NOTE — Patient Instructions (Addendum)
 It was great to see you! Thank you for allowing me to participate in your care!  I recommend that you always bring your medications to each appointment as this makes it easy to ensure you are on the correct medications and helps us  not miss when refills are needed.  Our plans for today:  - Orthostatic vitals not fully positive but you did have a decrease in your systolic blood pressure upon sitting. - If you develop any concerning symptoms such as shortness of breath, extreme fatigue, heart palpitations or chest pain or if the lightheadedness gets worse and becomes more persistent, please return  Orthostatic hypotension education -avoid dehydration. Often it requires high volumes of fluids, often with salt/electrolytes included, to stay hydrated. People with orthostasis are very sensitive to fluid shifts and dehydration. Oral rehydration is preferred, and routine use of IV fluids is not recommended. -if tolerated, compression stocking can assist with fluid management and prevent pooling in the legs. -slow position changes are recommended -if there is a feeling of severe lightheadedness, like near to passing out, recommend lying on the floor on the back, with legs elevated up on a chair or up against the wall. -the best long term management is gradual exercise conditioning. I recommend seated exercises such as bike to start, to avoid the risk of falling with lightheadedness. Exercise programs, either through supervised programs like cardiac rehab or through personal programs, should focus on gradually increasing exercise tolerance and conditioning.    We are checking some labs today, I will call you if they are abnormal will send you a MyChart message or a letter if they are normal.  If you do not hear about your labs in the next 2 weeks please let us  know.  Take care and seek immediate care sooner if you develop any concerns.   Dr. Glenn Lange, DO [facility]

## 2023-08-05 NOTE — Assessment & Plan Note (Signed)
-  Continue atorvastatin  and fish oil -Lipid panel today

## 2023-08-05 NOTE — Progress Notes (Cosign Needed)
    SUBJECTIVE:   Chief compliant/HPI: annual examination  Denise Aguirre is a 63 y.o. who presents today for an annual exam.   She experiences occasional lightheadedness upon standing, self resolving quickly without associated palpitations, shortness of breath, fatigue or chest pain.   OBJECTIVE:   BP 135/81   Pulse 72   Ht 5' 5 (1.651 m)   Wt 155 lb 6.4 oz (70.5 kg)   LMP 09/06/2013   SpO2 99%   BMI 25.86 kg/m    General: Well-appearing 63 year old female, NAD HEENT: White sclera, clear conjunctiva, MMM Cardio: RRR, normal S1/S2 Lungs: CTAB, normal effort Abdomen: Soft, nontender palpation, nondistended, bowel sounds present Skin: Warm and dry Neuro: Alert, cranial 2 through 12 intact, sensation intact, finger-nose-finger test normal Psych: Mood and affect appropriate for situation  ASSESSMENT/PLAN:   Light headedness Orthostatic hypotension suspected due to 19-point systolic drop when going from lying to sitting and patient was mildly symptomatic.  Normal neuroexam is reassuring.  Patient had heart monitor last year which was nonconcerning for arrhythmia. - Order CBC to rule out anemia and CMP to rule out electrolyte abnormalities and ensure normal liver and kidney function - Educated on hydration, slow positional changes, circulation exercises. - Advised to return if symptoms worsen or new symptoms develop.  Hyperlipidemia -Continue atorvastatin  and fish oil -Lipid panel today    Annual Examination  See AVS for age appropriate recommendations  PHQ score 0, reviewed and discussed.  BP reviewed and at goal .  Asked about intimate partner violence and resources given as appropriate  Advance directives discussion - packet given   Considered the following items based upon USPSTF recommendations: Diabetes screening: ordered - prediabetic range, Discussed increasing physical activity and provided diabetes and nutrition hand out  HIV testing:  discussed Hepatitis C: discussed Hepatitis B: discussed Syphilis if at high risk: discussed GC/CT not at high risk and not ordered. Pt has hx osteoporosis seen on DEXA in 2021,  DEXA ordered.  Reviewed risk factors for latent tuberculosis and not indicated    Cervical cancer screening: s/p hysterectomy several years ago Breast cancer screening: UTD Colorectal cancer screening: up to date on screening for CRC. Vaccinations declines vaccines today .   Follow up in 1  year or sooner if indicated.    Glenn Lange, DO [facility]

## 2023-08-06 ENCOUNTER — Ambulatory Visit: Payer: Self-pay | Admitting: Student

## 2023-08-06 LAB — COMPREHENSIVE METABOLIC PANEL WITH GFR
ALT: 16 IU/L (ref 0–32)
AST: 17 IU/L (ref 0–40)
Albumin: 4.7 g/dL (ref 3.9–4.9)
Alkaline Phosphatase: 86 IU/L (ref 44–121)
BUN/Creatinine Ratio: 32 — ABNORMAL HIGH (ref 12–28)
BUN: 23 mg/dL (ref 8–27)
Bilirubin Total: 0.5 mg/dL (ref 0.0–1.2)
CO2: 22 mmol/L (ref 20–29)
Calcium: 9.8 mg/dL (ref 8.7–10.3)
Chloride: 101 mmol/L (ref 96–106)
Creatinine, Ser: 0.71 mg/dL (ref 0.57–1.00)
Globulin, Total: 2.3 g/dL (ref 1.5–4.5)
Glucose: 96 mg/dL (ref 70–99)
Potassium: 4.4 mmol/L (ref 3.5–5.2)
Sodium: 141 mmol/L (ref 134–144)
Total Protein: 7 g/dL (ref 6.0–8.5)
eGFR: 96 mL/min/{1.73_m2} (ref >59)

## 2023-08-06 LAB — LIPID PANEL
Chol/HDL Ratio: 2.8 ratio (ref 0.0–4.4)
Cholesterol, Total: 164 mg/dL (ref 100–199)
HDL: 59 mg/dL (ref >39)
LDL Chol Calc (NIH): 93 mg/dL (ref 0–99)
Triglycerides: 60 mg/dL (ref 0–149)
VLDL Cholesterol Cal: 12 mg/dL (ref 5–40)

## 2023-08-06 LAB — CBC
Hematocrit: 44.2 % (ref 34.0–46.6)
Hemoglobin: 13.9 g/dL (ref 11.1–15.9)
MCH: 29 pg (ref 26.6–33.0)
MCHC: 31.4 g/dL — ABNORMAL LOW (ref 31.5–35.7)
MCV: 92 fL (ref 79–97)
Platelets: 228 10*3/uL (ref 150–450)
RBC: 4.8 x10E6/uL (ref 3.77–5.28)
RDW: 13.2 % (ref 11.7–15.4)
WBC: 4.9 10*3/uL (ref 3.4–10.8)

## 2023-08-11 DIAGNOSIS — M222X1 Patellofemoral disorders, right knee: Secondary | ICD-10-CM | POA: Diagnosis not present

## 2023-08-11 DIAGNOSIS — M222X2 Patellofemoral disorders, left knee: Secondary | ICD-10-CM | POA: Diagnosis not present

## 2023-08-18 DIAGNOSIS — M222X1 Patellofemoral disorders, right knee: Secondary | ICD-10-CM | POA: Diagnosis not present

## 2023-08-18 DIAGNOSIS — M222X2 Patellofemoral disorders, left knee: Secondary | ICD-10-CM | POA: Diagnosis not present

## 2023-08-23 DIAGNOSIS — M222X2 Patellofemoral disorders, left knee: Secondary | ICD-10-CM | POA: Diagnosis not present

## 2023-08-23 DIAGNOSIS — M222X1 Patellofemoral disorders, right knee: Secondary | ICD-10-CM | POA: Diagnosis not present

## 2023-08-24 DIAGNOSIS — R102 Pelvic and perineal pain: Secondary | ICD-10-CM | POA: Diagnosis not present

## 2023-08-24 DIAGNOSIS — M222X1 Patellofemoral disorders, right knee: Secondary | ICD-10-CM | POA: Diagnosis not present

## 2023-08-24 DIAGNOSIS — G894 Chronic pain syndrome: Secondary | ICD-10-CM | POA: Diagnosis not present

## 2023-08-24 DIAGNOSIS — N3010 Interstitial cystitis (chronic) without hematuria: Secondary | ICD-10-CM | POA: Diagnosis not present

## 2023-08-24 DIAGNOSIS — M222X2 Patellofemoral disorders, left knee: Secondary | ICD-10-CM | POA: Diagnosis not present

## 2023-08-26 DIAGNOSIS — M222X1 Patellofemoral disorders, right knee: Secondary | ICD-10-CM | POA: Diagnosis not present

## 2023-08-26 DIAGNOSIS — M222X2 Patellofemoral disorders, left knee: Secondary | ICD-10-CM | POA: Diagnosis not present

## 2023-09-06 DIAGNOSIS — M222X2 Patellofemoral disorders, left knee: Secondary | ICD-10-CM | POA: Diagnosis not present

## 2023-09-06 DIAGNOSIS — M222X1 Patellofemoral disorders, right knee: Secondary | ICD-10-CM | POA: Diagnosis not present

## 2023-09-08 DIAGNOSIS — M222X2 Patellofemoral disorders, left knee: Secondary | ICD-10-CM | POA: Diagnosis not present

## 2023-09-08 DIAGNOSIS — M222X1 Patellofemoral disorders, right knee: Secondary | ICD-10-CM | POA: Diagnosis not present

## 2023-09-14 ENCOUNTER — Other Ambulatory Visit: Payer: Self-pay | Admitting: *Deleted

## 2023-09-14 MED ORDER — ATORVASTATIN CALCIUM 40 MG PO TABS: ORAL_TABLET | ORAL | 3 refills | Status: AC

## 2023-09-15 DIAGNOSIS — M222X2 Patellofemoral disorders, left knee: Secondary | ICD-10-CM | POA: Diagnosis not present

## 2023-09-15 DIAGNOSIS — M222X1 Patellofemoral disorders, right knee: Secondary | ICD-10-CM | POA: Diagnosis not present

## 2023-09-29 DIAGNOSIS — M222X2 Patellofemoral disorders, left knee: Secondary | ICD-10-CM | POA: Diagnosis not present

## 2023-09-29 DIAGNOSIS — M222X1 Patellofemoral disorders, right knee: Secondary | ICD-10-CM | POA: Diagnosis not present

## 2023-10-03 DIAGNOSIS — M222X2 Patellofemoral disorders, left knee: Secondary | ICD-10-CM | POA: Diagnosis not present

## 2023-10-03 DIAGNOSIS — M222X1 Patellofemoral disorders, right knee: Secondary | ICD-10-CM | POA: Diagnosis not present

## 2023-10-24 ENCOUNTER — Ambulatory Visit: Admitting: Family Medicine

## 2023-10-26 DIAGNOSIS — Z5181 Encounter for therapeutic drug level monitoring: Secondary | ICD-10-CM | POA: Diagnosis not present

## 2023-10-26 DIAGNOSIS — G894 Chronic pain syndrome: Secondary | ICD-10-CM | POA: Diagnosis not present

## 2023-10-26 DIAGNOSIS — N3010 Interstitial cystitis (chronic) without hematuria: Secondary | ICD-10-CM | POA: Diagnosis not present

## 2023-10-26 DIAGNOSIS — Z79899 Other long term (current) drug therapy: Secondary | ICD-10-CM | POA: Diagnosis not present

## 2023-10-26 DIAGNOSIS — R102 Pelvic and perineal pain: Secondary | ICD-10-CM | POA: Diagnosis not present

## 2023-10-28 ENCOUNTER — Ambulatory Visit (INDEPENDENT_AMBULATORY_CARE_PROVIDER_SITE_OTHER): Admitting: Family Medicine

## 2023-10-28 VITALS — BP 124/86 | HR 74 | Temp 97.6°F | Ht 65.0 in

## 2023-10-28 DIAGNOSIS — J069 Acute upper respiratory infection, unspecified: Secondary | ICD-10-CM

## 2023-10-28 LAB — POC SOFIA 2 FLU + SARS ANTIGEN FIA
Influenza A, POC: NEGATIVE
Influenza B, POC: NEGATIVE
SARS Coronavirus 2 Ag: NEGATIVE

## 2023-10-28 NOTE — Progress Notes (Signed)
   SUBJECTIVE:   CHIEF COMPLAINT / HPI:  Denise Aguirre is a 63 y.o. female with no pertinent past medical history  presenting to the clinic for   Cough:  Patient complains of malaise, stomach discomfort, fatigue since Saturday or Sunday, about 5 days ago. Denies fevers, anosmia, cough, sneezing, rhinorrhea, dyspnea. Appetite is poor, but able to stay hydrated. Had a negative COVID test at home, but expired a few years ago. Patient states she can feel her heartbeat racing. Patient previously saw Cardiology with unremarkable findings on 30 day monitor. Has never been diagnosed with Afib or other arrhythmia before. Patient reports she previously felt similar symptoms with COVID illness a few years ago. Normally walks 4-5 miles daily. Has been feeling better, but still not totally normal.   PERTINENT PMH / PSH: Prediabetes, vitamin D  deficiency, HLD History of palpitations as above  *Remainder reviewed in problem list.   OBJECTIVE:   BP 124/86   Pulse 74   Temp 97.6 F (36.4 C)   Ht 5' 5 (1.651 m)   LMP 09/06/2013   SpO2 100%   BMI 25.86 kg/m   General: Age-appropriate, resting comfortably in chair, NAD, alert and at baseline. HEENT:  Head: Normocephalic, atraumatic. No tenderness to percussion over sinuses. Eyes: PERRLA. No conjunctival erythema or scleral injections. Nose: No erythema of turbinates. No rhinorrhea. Mouth/Oral: Mild oropharyngeal erythema, no tonsillar exudate. MMM. Neck: Supple. No LAD. Cardiovascular: Regular rate and rhythm. Normal S1/S2. No murmurs, rubs, or gallops appreciated. 2+ radial pulses. Pulmonary: Clear bilaterally to ascultation. No wheezes, crackles, or rhonchi. Normal WOB on room air. No accessory muscle use. Abdominal: No tenderness to deep or light palpation. No rebound or guarding. No HSM. Skin: Warm and dry. Extremities: No peripheral edema bilaterally. Capillary refill <2 seconds.  Results for orders placed or performed  in visit on 10/28/23 (from the past 48 hours)  POC SOFIA 2 FLU + SARS ANTIGEN FIA     Status: None   Collection Time: 10/28/23  1:35 PM  Result Value Ref Range   Influenza A, POC Negative Negative   Influenza B, POC Negative Negative   SARS Coronavirus 2 Ag Negative Negative    ASSESSMENT/PLAN:   Assessment & Plan Upper respiratory tract infection, unspecified type Patient is well-hydrated with no respiratory distress.  Lung exam unremarkable and the patient has remained afebrile with no evidence of pneumonia.  Negative for COVID and influenza.  Favor mild viral URI. Palpitations are mild and transient, quickly resolved.  Previously worked up by cardiology without significant findings. - Supportive care - Return precautions if palpitations worsening - Resume daily exercise when able.  Denise Bellisario Toma, MD [facility]

## 2023-10-28 NOTE — Patient Instructions (Signed)
 It was great to see you today! Thank you for choosing [facility] for your primary care.  Today we addressed: 1. Upper respiratory tract infection, unspecified type     If you had a referral placed, they will call you to set up an appointment. Please give us  a call if you don't hear back in the next 2 weeks.  You should return to our clinic   Thank you for coming to see us  at [facility] and for the opportunity to care for you! Eliya Geiman, MD 10/28/2023, 2:06 PM

## 2023-11-01 ENCOUNTER — Ambulatory Visit: Admitting: Family Medicine

## 2023-11-21 NOTE — Progress Notes (Deleted)
    SUBJECTIVE:   CHIEF COMPLAINT / HPI:   Request sleep study  Do you snore loudly: Do you often feel fatigued or tired or sleepy during the day time: Has anyone observed you stop breathing at night: Do you have high blood pressure: BMI (>35 +1): AGE (>50 +1): Neck circumference (>40 +1): Gender (Female +1):   PERTINENT  PMH / PSH: ***  OBJECTIVE:   LMP 09/06/2013  ***  General: NAD, pleasant, able to participate in exam Cardiac: RRR, no murmurs. Respiratory: CTAB, normal effort, No wheezes, rales or rhonchi Abdomen: Bowel sounds present, nontender, nondistended Extremities: no edema or cyanosis. Skin: warm and dry, no rashes noted Neuro: alert, no obvious focal deficits Psych: Normal affect and mood  ASSESSMENT/PLAN:   No problem-specific Assessment & Plan notes found for this encounter.     Dr. Izetta Nap, DO [facility]    {    This will disappear when note is signed, click to select method of visit    :1}

## 2023-11-22 ENCOUNTER — Ambulatory Visit: Admitting: Family Medicine

## 2023-11-22 ENCOUNTER — Other Ambulatory Visit: Payer: Self-pay | Admitting: Family Medicine

## 2023-11-22 DIAGNOSIS — Z1231 Encounter for screening mammogram for malignant neoplasm of breast: Secondary | ICD-10-CM

## 2023-11-23 DIAGNOSIS — M222X2 Patellofemoral disorders, left knee: Secondary | ICD-10-CM | POA: Diagnosis not present

## 2023-11-23 DIAGNOSIS — M170 Bilateral primary osteoarthritis of knee: Secondary | ICD-10-CM | POA: Diagnosis not present

## 2023-11-23 DIAGNOSIS — M222X1 Patellofemoral disorders, right knee: Secondary | ICD-10-CM | POA: Diagnosis not present

## 2023-12-09 DIAGNOSIS — M25561 Pain in right knee: Secondary | ICD-10-CM | POA: Diagnosis not present

## 2023-12-09 DIAGNOSIS — M25562 Pain in left knee: Secondary | ICD-10-CM | POA: Diagnosis not present

## 2023-12-12 ENCOUNTER — Other Ambulatory Visit: Payer: Self-pay | Admitting: Medical Genetics

## 2023-12-12 DIAGNOSIS — Z006 Encounter for examination for normal comparison and control in clinical research program: Secondary | ICD-10-CM

## 2023-12-20 ENCOUNTER — Ambulatory Visit

## 2023-12-22 DIAGNOSIS — N3010 Interstitial cystitis (chronic) without hematuria: Secondary | ICD-10-CM | POA: Diagnosis not present

## 2023-12-22 DIAGNOSIS — G894 Chronic pain syndrome: Secondary | ICD-10-CM | POA: Diagnosis not present

## 2023-12-22 DIAGNOSIS — R102 Pelvic and perineal pain unspecified side: Secondary | ICD-10-CM | POA: Diagnosis not present

## 2023-12-23 ENCOUNTER — Ambulatory Visit
Admission: RE | Admit: 2023-12-23 | Discharge: 2023-12-23 | Disposition: A | Discharge status: Home / Self Care | Source: Ambulatory Visit | Attending: Family Medicine | Admitting: Family Medicine

## 2023-12-23 DIAGNOSIS — Z1231 Encounter for screening mammogram for malignant neoplasm of breast: Secondary | ICD-10-CM | POA: Diagnosis not present

## 2023-12-29 ENCOUNTER — Ambulatory Visit: Payer: Self-pay | Admitting: Family Medicine

## 2024-01-06 DIAGNOSIS — M25561 Pain in right knee: Secondary | ICD-10-CM | POA: Diagnosis not present

## 2024-01-06 DIAGNOSIS — M25562 Pain in left knee: Secondary | ICD-10-CM | POA: Diagnosis not present

## 2024-01-09 LAB — GENECONNECT MOLECULAR SCREEN: Genetic Analysis Overall Interpretation: NEGATIVE

## 2024-01-10 DIAGNOSIS — M25561 Pain in right knee: Secondary | ICD-10-CM | POA: Diagnosis not present

## 2024-01-10 DIAGNOSIS — M25562 Pain in left knee: Secondary | ICD-10-CM | POA: Diagnosis not present

## 2024-01-26 ENCOUNTER — Ambulatory Visit

## 2024-02-13 ENCOUNTER — Ambulatory Visit

## 2024-03-02 ENCOUNTER — Other Ambulatory Visit: Payer: Self-pay | Admitting: Family Medicine

## 2024-03-02 ENCOUNTER — Other Ambulatory Visit: Payer: Self-pay

## 2024-03-02 MED ORDER — PANTOPRAZOLE SODIUM 40 MG PO TBEC: 40.0000 mg | DELAYED_RELEASE_TABLET | Freq: Two times a day (BID) | ORAL | 0 refills | Status: DC

## 2024-03-05 ENCOUNTER — Other Ambulatory Visit: Payer: Self-pay

## 2024-03-05 MED ORDER — PANTOPRAZOLE SODIUM 40 MG PO TBEC: 40.0000 mg | DELAYED_RELEASE_TABLET | Freq: Every day | ORAL | 1 refills | Status: AC

## 2024-03-08 NOTE — Progress Notes (Signed)
" ° ° °  SUBJECTIVE:   CHIEF COMPLAINT / HPI:   Discussed the use of AI scribe software for clinical note transcription with the patient, who gave verbal consent to proceed.  Sleep-disordered breathing symptoms - Loud snoring observed by her daughter - Daytime fatigue - Non-restorative sleep - No observed nocturnal breathing pauses - Reports son may also have OSA  Prediabetes - History of gestational diabetes during pregnancy - Hemoglobin A1c stable in prediabetic range (recent 6.0, previous 5.9 and 6.1 over two years) - Manages glycemic status with lifestyle modifications, not on medication  PERTINENT  PMH / PSH: Prediabetes  OBJECTIVE:   BP 120/71   Pulse 89   Ht 5' 5 (1.651 m)   Wt 160 lb 9.6 oz (72.8 kg)   LMP 09/06/2013   SpO2 97%   BMI 26.73 kg/m   General: Alert, no apparent distress, well groomed HEENT: Normocephalic, atraumatic, moist mucus membranes, neck supple Respiratory: Normal respiratory effort GI: Non-distended Skin: No rashes, no jaundice Psych: Appropriate mood and affect  ASSESSMENT/PLAN:   Assessment & Plan Snoring Significant snoring and daytime fatigue suggestive of obstructive sleep apnea. Prefers home sleep study. - Ordered home sleep study. - Refer to sleep medicine for further evaluation and potential CPAP initiation if home sleep study is positive. Hyperglycemia A1c is 6.0, consistent with prediabetes. Previous A1c values stable. Discussed potential for metformin if A1c increases. Emphasized exercise and diet importance. - Repeat A1c annually - Advised on maintaining a healthy diet and daily exercise   Dr. Izetta Nap, DO [facility]     "

## 2024-03-09 ENCOUNTER — Ambulatory Visit: Payer: Self-pay | Admitting: Family Medicine

## 2024-03-09 ENCOUNTER — Encounter: Payer: Self-pay | Admitting: Family Medicine

## 2024-03-09 VITALS — BP 120/71 | HR 89 | Ht 65.0 in | Wt 160.6 lb

## 2024-03-09 DIAGNOSIS — R739 Hyperglycemia, unspecified: Secondary | ICD-10-CM

## 2024-03-09 DIAGNOSIS — R0683 Snoring: Secondary | ICD-10-CM

## 2024-03-09 LAB — POCT GLYCOSYLATED HEMOGLOBIN (HGB A1C): Hemoglobin A1C: 6 % — AB (ref 4.0–5.6)

## 2024-03-09 NOTE — Patient Instructions (Signed)
 It was wonderful to see you today! Thank you for choosing [facility].   Please bring ALL of your medications with you to every visit.   Today we talked about:  I sent in the order for a home sleep study to be done, this gets run through insurance and may send you the device to your home and it tells you how to mail it back.  It will then be read by a sleep medicine doctor and the results are sent over to us .  If you do have sleep apnea as discussed we may still need to refer you to sleep medicine for discussion of options and CPAP initiation/titration. Your A1c was 6.0, this is still well-controlled and in the prediabetic range.  We will continue to monitor it and if it is increasing we can consider starting metformin but for now just focus on getting good exercise and watching her diet.  Please follow up pending sleep study results and as scheduled with PCP  Call the clinic at [phone] if your symptoms worsen or you have any concerns.  Please be sure to schedule follow up at the front desk before you leave today.   Izetta Nap, DO Family Medicine

## 2024-04-03 ENCOUNTER — Ambulatory Visit: Admitting: Family Medicine

## 2024-04-14 ENCOUNTER — Other Ambulatory Visit (HOSPITAL_BASED_OUTPATIENT_CLINIC_OR_DEPARTMENT_OTHER)

## 2024-04-16 ENCOUNTER — Other Ambulatory Visit (HOSPITAL_BASED_OUTPATIENT_CLINIC_OR_DEPARTMENT_OTHER)
